# Patient Record
Sex: Male | Born: 1975 | Race: Black or African American | Hispanic: No | Marital: Single | Smoking: Never smoker
Health system: Southern US, Community
[De-identification: ages and names within clinical notes are randomized; demographics above are authoritative.]

## PROBLEM LIST (undated history)

## (undated) DIAGNOSIS — E785 Hyperlipidemia, unspecified: Secondary | ICD-10-CM

## (undated) DIAGNOSIS — I10 Essential (primary) hypertension: Secondary | ICD-10-CM

## (undated) DIAGNOSIS — H43399 Other vitreous opacities, unspecified eye: Secondary | ICD-10-CM

## (undated) DIAGNOSIS — R002 Palpitations: Secondary | ICD-10-CM

## (undated) DIAGNOSIS — Z21 Asymptomatic human immunodeficiency virus [HIV] infection status: Secondary | ICD-10-CM

## (undated) DIAGNOSIS — Z720 Tobacco use: Secondary | ICD-10-CM

## (undated) DIAGNOSIS — B20 Human immunodeficiency virus [HIV] disease: Secondary | ICD-10-CM

## (undated) HISTORY — DX: Palpitations: R00.2

## (undated) HISTORY — DX: Tobacco use: Z72.0

## (undated) HISTORY — PX: NO PAST SURGERIES: SHX2092

## (undated) HISTORY — DX: Other subjective visual disturbances: H43.399

---

## 2006-02-19 ENCOUNTER — Encounter (INDEPENDENT_AMBULATORY_CARE_PROVIDER_SITE_OTHER): Payer: Self-pay | Admitting: *Deleted

## 2006-02-19 ENCOUNTER — Ambulatory Visit: Payer: Self-pay | Admitting: Internal Medicine

## 2006-02-19 ENCOUNTER — Encounter: Admission: RE | Admit: 2006-02-19 | Discharge: 2006-02-19 | Payer: Self-pay | Admitting: Internal Medicine

## 2006-02-19 LAB — CONVERTED CEMR LAB: CD4 T Cell Abs: 510

## 2006-03-10 ENCOUNTER — Ambulatory Visit: Payer: Self-pay | Admitting: Internal Medicine

## 2006-03-20 ENCOUNTER — Ambulatory Visit: Payer: Self-pay | Admitting: Internal Medicine

## 2006-03-27 DIAGNOSIS — B20 Human immunodeficiency virus [HIV] disease: Secondary | ICD-10-CM

## 2006-03-27 DIAGNOSIS — R51 Headache: Secondary | ICD-10-CM | POA: Insufficient documentation

## 2006-03-27 DIAGNOSIS — R519 Headache, unspecified: Secondary | ICD-10-CM | POA: Insufficient documentation

## 2006-04-27 ENCOUNTER — Encounter (INDEPENDENT_AMBULATORY_CARE_PROVIDER_SITE_OTHER): Payer: Self-pay | Admitting: *Deleted

## 2006-04-27 LAB — CONVERTED CEMR LAB
CD4 Count: 0 microliters
HIV 1 RNA Quant: 0 copies/mL

## 2006-04-29 ENCOUNTER — Encounter: Payer: Self-pay | Admitting: Internal Medicine

## 2006-05-10 ENCOUNTER — Encounter (INDEPENDENT_AMBULATORY_CARE_PROVIDER_SITE_OTHER): Payer: Self-pay | Admitting: *Deleted

## 2006-07-03 ENCOUNTER — Telehealth: Payer: Self-pay | Admitting: Internal Medicine

## 2006-07-09 ENCOUNTER — Encounter: Admission: RE | Admit: 2006-07-09 | Discharge: 2006-07-09 | Payer: Self-pay | Admitting: Internal Medicine

## 2006-07-09 ENCOUNTER — Ambulatory Visit: Payer: Self-pay | Admitting: Internal Medicine

## 2006-07-09 LAB — CONVERTED CEMR LAB
ALT: 9 units/L (ref 0–53)
Alkaline Phosphatase: 55 units/L (ref 39–117)
Basophils Absolute: 0 10*3/uL (ref 0.0–0.1)
CD4 Count: 650 microliters
CO2: 27 meq/L (ref 19–32)
Eosinophils Relative: 3 % (ref 0–5)
HCT: 42.8 % (ref 39.0–52.0)
Lymphocytes Relative: 46 % (ref 12–46)
Platelets: 232 10*3/uL (ref 150–400)
RDW: 14.3 % — ABNORMAL HIGH (ref 11.5–14.0)
Sodium: 138 meq/L (ref 135–145)
Total Bilirubin: 0.5 mg/dL (ref 0.3–1.2)
Total Protein: 7.8 g/dL (ref 6.0–8.3)

## 2006-07-22 ENCOUNTER — Telehealth: Payer: Self-pay | Admitting: Internal Medicine

## 2006-07-29 ENCOUNTER — Ambulatory Visit: Payer: Self-pay | Admitting: Internal Medicine

## 2006-08-27 ENCOUNTER — Telehealth: Payer: Self-pay | Admitting: Internal Medicine

## 2006-10-05 ENCOUNTER — Telehealth: Payer: Self-pay

## 2006-10-19 ENCOUNTER — Emergency Department (HOSPITAL_COMMUNITY): Admission: EM | Admit: 2006-10-19 | Discharge: 2006-10-20 | Payer: Self-pay | Admitting: Emergency Medicine

## 2006-10-22 ENCOUNTER — Encounter: Admission: RE | Admit: 2006-10-22 | Discharge: 2006-10-22 | Payer: Self-pay | Admitting: Internal Medicine

## 2006-10-22 ENCOUNTER — Ambulatory Visit: Payer: Self-pay | Admitting: Internal Medicine

## 2006-10-22 LAB — CONVERTED CEMR LAB
AST: 16 units/L (ref 0–37)
Alkaline Phosphatase: 50 units/L (ref 39–117)
BUN: 11 mg/dL (ref 6–23)
Basophils Relative: 0 % (ref 0–1)
Calcium: 9.3 mg/dL (ref 8.4–10.5)
Creatinine, Ser: 1.02 mg/dL (ref 0.40–1.50)
Eosinophils Absolute: 0.1 10*3/uL (ref 0.0–0.7)
Eosinophils Relative: 2 % (ref 0–5)
HCT: 45.3 % (ref 39.0–52.0)
HIV-1 RNA Quant, Log: 3.81 — ABNORMAL HIGH (ref <1.70)
Hemoglobin: 14.9 g/dL (ref 13.0–17.0)
MCHC: 32.9 g/dL (ref 30.0–36.0)
MCV: 80.9 fL (ref 78.0–100.0)
Monocytes Absolute: 0.6 10*3/uL (ref 0.2–0.7)
Monocytes Relative: 10 % (ref 3–11)
RBC: 5.6 M/uL (ref 4.22–5.81)

## 2006-11-10 ENCOUNTER — Ambulatory Visit: Payer: Self-pay | Admitting: Internal Medicine

## 2006-11-10 DIAGNOSIS — R1013 Epigastric pain: Secondary | ICD-10-CM | POA: Insufficient documentation

## 2007-01-22 ENCOUNTER — Ambulatory Visit: Payer: Self-pay | Admitting: Internal Medicine

## 2007-02-09 ENCOUNTER — Encounter: Admission: RE | Admit: 2007-02-09 | Discharge: 2007-02-09 | Payer: Self-pay | Admitting: Internal Medicine

## 2007-02-09 ENCOUNTER — Ambulatory Visit: Payer: Self-pay | Admitting: Internal Medicine

## 2007-02-09 LAB — CONVERTED CEMR LAB
Albumin: 4.1 g/dL (ref 3.5–5.2)
Alkaline Phosphatase: 52 units/L (ref 39–117)
BUN: 14 mg/dL (ref 6–23)
CO2: 26 meq/L (ref 19–32)
Calcium: 9.4 mg/dL (ref 8.4–10.5)
Eosinophils Absolute: 0.1 10*3/uL — ABNORMAL LOW (ref 0.2–0.7)
Glucose, Bld: 75 mg/dL (ref 70–99)
HCT: 43 % (ref 39.0–52.0)
HIV 1 RNA Quant: 15500 copies/mL — ABNORMAL HIGH (ref <50)
HIV-1 RNA Quant, Log: 4.19 — ABNORMAL HIGH (ref <1.70)
Lymphocytes Relative: 37 % (ref 12–46)
Lymphs Abs: 1.9 10*3/uL (ref 0.7–4.0)
MCV: 83.2 fL (ref 78.0–100.0)
Monocytes Relative: 12 % (ref 3–12)
Neutrophils Relative %: 49 % (ref 43–77)
Potassium: 4.4 meq/L (ref 3.5–5.3)
RBC: 5.17 M/uL (ref 4.22–5.81)
Total Protein: 7.6 g/dL (ref 6.0–8.3)
WBC: 5.1 10*3/uL (ref 4.0–10.5)

## 2007-03-02 ENCOUNTER — Ambulatory Visit: Payer: Self-pay | Admitting: Internal Medicine

## 2007-03-02 ENCOUNTER — Encounter (INDEPENDENT_AMBULATORY_CARE_PROVIDER_SITE_OTHER): Payer: Self-pay | Admitting: *Deleted

## 2007-03-02 DIAGNOSIS — F411 Generalized anxiety disorder: Secondary | ICD-10-CM | POA: Insufficient documentation

## 2007-03-30 ENCOUNTER — Telehealth: Payer: Self-pay | Admitting: Internal Medicine

## 2007-06-07 ENCOUNTER — Ambulatory Visit: Payer: Self-pay | Admitting: Internal Medicine

## 2007-06-07 ENCOUNTER — Encounter: Admission: RE | Admit: 2007-06-07 | Discharge: 2007-06-07 | Payer: Self-pay | Admitting: Internal Medicine

## 2007-06-07 LAB — CONVERTED CEMR LAB
ALT: 8 units/L (ref 0–53)
Albumin: 4 g/dL (ref 3.5–5.2)
Basophils Absolute: 0 10*3/uL (ref 0.0–0.1)
CO2: 29 meq/L (ref 19–32)
Chloride: 103 meq/L (ref 96–112)
Glucose, Bld: 96 mg/dL (ref 70–99)
HCT: 42.5 % (ref 39.0–52.0)
HIV 1 RNA Quant: 13200 copies/mL — ABNORMAL HIGH (ref <50)
HIV-1 RNA Quant, Log: 4.12 — ABNORMAL HIGH (ref <1.70)
Lymphocytes Relative: 40 % (ref 12–46)
Lymphs Abs: 2.1 10*3/uL (ref 0.7–4.0)
Neutro Abs: 2.5 10*3/uL (ref 1.7–7.7)
Neutrophils Relative %: 47 % (ref 43–77)
Platelets: 229 10*3/uL (ref 150–400)
Potassium: 4.1 meq/L (ref 3.5–5.3)
RDW: 14.2 % (ref 11.5–15.5)
Sodium: 142 meq/L (ref 135–145)
Total Bilirubin: 0.5 mg/dL (ref 0.3–1.2)
Total Protein: 7.6 g/dL (ref 6.0–8.3)
WBC: 5.3 10*3/uL (ref 4.0–10.5)

## 2007-07-14 ENCOUNTER — Ambulatory Visit: Payer: Self-pay | Admitting: Internal Medicine

## 2007-09-24 ENCOUNTER — Ambulatory Visit: Payer: Self-pay | Admitting: Internal Medicine

## 2007-09-24 ENCOUNTER — Ambulatory Visit (HOSPITAL_COMMUNITY): Admission: RE | Admit: 2007-09-24 | Discharge: 2007-09-24 | Payer: Self-pay | Admitting: Internal Medicine

## 2007-09-24 DIAGNOSIS — M549 Dorsalgia, unspecified: Secondary | ICD-10-CM | POA: Insufficient documentation

## 2008-03-28 ENCOUNTER — Ambulatory Visit: Payer: Self-pay | Admitting: Internal Medicine

## 2008-03-28 LAB — CONVERTED CEMR LAB
ALT: 13 units/L (ref 0–53)
AST: 18 units/L (ref 0–37)
Basophils Absolute: 0 10*3/uL (ref 0.0–0.1)
Basophils Relative: 0 % (ref 0–1)
Creatinine, Ser: 0.85 mg/dL (ref 0.40–1.50)
Eosinophils Relative: 1 % (ref 0–5)
Hemoglobin: 14 g/dL (ref 13.0–17.0)
Lymphocytes Relative: 45 % (ref 12–46)
MCHC: 32.6 g/dL (ref 30.0–36.0)
Monocytes Absolute: 0.5 10*3/uL (ref 0.1–1.0)
Neutro Abs: 2.5 10*3/uL (ref 1.7–7.7)
Platelets: 202 10*3/uL (ref 150–400)
RDW: 14.4 % (ref 11.5–15.5)
Sodium: 140 meq/L (ref 135–145)
Total Bilirubin: 0.5 mg/dL (ref 0.3–1.2)
Total Protein: 7.6 g/dL (ref 6.0–8.3)

## 2008-03-29 ENCOUNTER — Encounter: Payer: Self-pay | Admitting: Internal Medicine

## 2008-04-14 ENCOUNTER — Encounter (INDEPENDENT_AMBULATORY_CARE_PROVIDER_SITE_OTHER): Payer: Self-pay | Admitting: *Deleted

## 2008-04-14 ENCOUNTER — Ambulatory Visit: Payer: Self-pay | Admitting: Internal Medicine

## 2008-08-30 ENCOUNTER — Ambulatory Visit: Payer: Self-pay | Admitting: Internal Medicine

## 2008-08-30 LAB — CONVERTED CEMR LAB
Albumin: 4 g/dL (ref 3.5–5.2)
BUN: 11 mg/dL (ref 6–23)
Basophils Absolute: 0 10*3/uL (ref 0.0–0.1)
Calcium: 9 mg/dL (ref 8.4–10.5)
Chloride: 106 meq/L (ref 96–112)
Creatinine, Ser: 0.91 mg/dL (ref 0.40–1.50)
Glucose, Bld: 86 mg/dL (ref 70–99)
HIV 1 RNA Quant: 18700 copies/mL — ABNORMAL HIGH (ref <48)
Hemoglobin: 14 g/dL (ref 13.0–17.0)
Lymphocytes Relative: 47 % — ABNORMAL HIGH (ref 12–46)
Lymphs Abs: 2 10*3/uL (ref 0.7–4.0)
Monocytes Absolute: 0.5 10*3/uL (ref 0.1–1.0)
Monocytes Relative: 11 % (ref 3–12)
Neutro Abs: 1.6 10*3/uL — ABNORMAL LOW (ref 1.7–7.7)
Potassium: 4.3 meq/L (ref 3.5–5.3)
RBC: 5.16 M/uL (ref 4.22–5.81)
RDW: 14.3 % (ref 11.5–15.5)
WBC: 4.2 10*3/uL (ref 4.0–10.5)

## 2008-09-19 ENCOUNTER — Encounter (INDEPENDENT_AMBULATORY_CARE_PROVIDER_SITE_OTHER): Payer: Self-pay | Admitting: *Deleted

## 2008-09-20 ENCOUNTER — Ambulatory Visit: Payer: Self-pay | Admitting: Internal Medicine

## 2008-10-31 ENCOUNTER — Emergency Department (HOSPITAL_COMMUNITY): Admission: EM | Admit: 2008-10-31 | Discharge: 2008-10-31 | Payer: Self-pay | Admitting: Emergency Medicine

## 2008-10-31 ENCOUNTER — Ambulatory Visit: Payer: Self-pay | Admitting: Internal Medicine

## 2008-10-31 DIAGNOSIS — M542 Cervicalgia: Secondary | ICD-10-CM | POA: Insufficient documentation

## 2008-11-03 ENCOUNTER — Ambulatory Visit (HOSPITAL_COMMUNITY): Admission: RE | Admit: 2008-11-03 | Discharge: 2008-11-03 | Payer: Self-pay | Admitting: Internal Medicine

## 2009-01-02 ENCOUNTER — Ambulatory Visit: Payer: Self-pay | Admitting: Internal Medicine

## 2009-01-02 LAB — CONVERTED CEMR LAB
Alkaline Phosphatase: 42 units/L (ref 39–117)
Basophils Relative: 0 % (ref 0–1)
Eosinophils Absolute: 0.1 10*3/uL (ref 0.0–0.7)
Glucose, Bld: 85 mg/dL (ref 70–99)
Hemoglobin: 14 g/dL (ref 13.0–17.0)
MCHC: 32.5 g/dL (ref 30.0–36.0)
MCV: 82.7 fL (ref >=78.0)
Monocytes Absolute: 0.4 10*3/uL (ref 0.1–1.0)
Monocytes Relative: 6 % (ref 3–12)
Neutrophils Relative %: 59 % (ref 43–77)
RBC: 5.21 M/uL (ref 4.22–5.81)
Sodium: 139 meq/L (ref 135–145)
Total Bilirubin: 0.4 mg/dL (ref 0.3–1.2)
Total Protein: 7.3 g/dL (ref 6.0–8.3)

## 2009-01-17 ENCOUNTER — Ambulatory Visit: Payer: Self-pay | Admitting: Internal Medicine

## 2009-02-05 ENCOUNTER — Telehealth (INDEPENDENT_AMBULATORY_CARE_PROVIDER_SITE_OTHER): Payer: Self-pay | Admitting: *Deleted

## 2009-04-11 ENCOUNTER — Emergency Department (HOSPITAL_BASED_OUTPATIENT_CLINIC_OR_DEPARTMENT_OTHER): Admission: EM | Admit: 2009-04-11 | Discharge: 2009-04-11 | Payer: Self-pay | Admitting: Emergency Medicine

## 2009-06-13 ENCOUNTER — Ambulatory Visit: Payer: Self-pay | Admitting: Internal Medicine

## 2009-06-13 LAB — CONVERTED CEMR LAB
Basophils Relative: 1 % (ref 0–1)
CO2: 28 meq/L (ref 19–32)
Creatinine, Ser: 1.01 mg/dL (ref 0.40–1.50)
Eosinophils Absolute: 0.1 10*3/uL (ref 0.0–0.7)
Glucose, Bld: 72 mg/dL (ref 70–99)
HIV 1 RNA Quant: 41400 copies/mL — ABNORMAL HIGH (ref <48)
HIV-1 RNA Quant, Log: 4.62 — ABNORMAL HIGH (ref <1.68)
Hemoglobin: 13.9 g/dL (ref 13.0–17.0)
MCHC: 32.6 g/dL (ref 30.0–36.0)
MCV: 83.7 fL (ref 78.0–100.0)
Monocytes Absolute: 0.3 10*3/uL (ref 0.1–1.0)
Monocytes Relative: 9 % (ref 3–12)
RBC: 5.09 M/uL (ref 4.22–5.81)
Total Bilirubin: 0.5 mg/dL (ref 0.3–1.2)
Total Protein: 7.6 g/dL (ref 6.0–8.3)

## 2009-06-25 ENCOUNTER — Ambulatory Visit: Payer: Self-pay | Admitting: Internal Medicine

## 2009-06-25 DIAGNOSIS — R630 Anorexia: Secondary | ICD-10-CM | POA: Insufficient documentation

## 2009-07-03 ENCOUNTER — Encounter (INDEPENDENT_AMBULATORY_CARE_PROVIDER_SITE_OTHER): Payer: Self-pay | Admitting: *Deleted

## 2009-11-19 ENCOUNTER — Telehealth (INDEPENDENT_AMBULATORY_CARE_PROVIDER_SITE_OTHER): Payer: Self-pay | Admitting: *Deleted

## 2009-12-06 ENCOUNTER — Ambulatory Visit: Payer: Self-pay | Admitting: Internal Medicine

## 2009-12-06 LAB — CONVERTED CEMR LAB
ALT: 10 units/L (ref 0–53)
Albumin: 4.3 g/dL (ref 3.5–5.2)
Basophils Absolute: 0 10*3/uL (ref 0.0–0.1)
CO2: 27 meq/L (ref 19–32)
Calcium: 8.9 mg/dL (ref 8.4–10.5)
Chloride: 104 meq/L (ref 96–112)
Creatinine, Ser: 0.97 mg/dL (ref 0.40–1.50)
HIV 1 RNA Quant: 29600 copies/mL — ABNORMAL HIGH (ref <20)
Hemoglobin: 13.8 g/dL (ref 13.0–17.0)
Lymphocytes Relative: 44 % (ref 12–46)
Monocytes Absolute: 0.4 10*3/uL (ref 0.1–1.0)
Neutro Abs: 1.8 10*3/uL (ref 1.7–7.7)
Neutrophils Relative %: 45 % (ref 43–77)
Potassium: 3.9 meq/L (ref 3.5–5.3)
RDW: 14.1 % (ref 11.5–15.5)
Sodium: 137 meq/L (ref 135–145)
Total Protein: 7.1 g/dL (ref 6.0–8.3)

## 2009-12-12 ENCOUNTER — Encounter (INDEPENDENT_AMBULATORY_CARE_PROVIDER_SITE_OTHER): Payer: Self-pay | Admitting: *Deleted

## 2009-12-28 ENCOUNTER — Ambulatory Visit: Payer: Self-pay | Admitting: Internal Medicine

## 2010-01-04 ENCOUNTER — Telehealth (INDEPENDENT_AMBULATORY_CARE_PROVIDER_SITE_OTHER): Payer: Self-pay | Admitting: *Deleted

## 2010-02-18 ENCOUNTER — Telehealth (INDEPENDENT_AMBULATORY_CARE_PROVIDER_SITE_OTHER): Payer: Self-pay | Admitting: *Deleted

## 2010-02-28 ENCOUNTER — Ambulatory Visit
Admission: RE | Admit: 2010-02-28 | Discharge: 2010-02-28 | Payer: Self-pay | Source: Home / Self Care | Attending: Internal Medicine | Admitting: Internal Medicine

## 2010-02-28 ENCOUNTER — Encounter: Payer: Self-pay | Admitting: Internal Medicine

## 2010-02-28 LAB — CONVERTED CEMR LAB
AST: 15 units/L (ref 0–37)
Alkaline Phosphatase: 32 units/L — ABNORMAL LOW (ref 39–117)
BUN: 11 mg/dL (ref 6–23)
Basophils Absolute: 0 10*3/uL (ref 0.0–0.1)
Basophils Relative: 0 % (ref 0–1)
Creatinine, Ser: 0.82 mg/dL (ref 0.40–1.50)
Eosinophils Relative: 0 % (ref 0–5)
Glucose, Bld: 102 mg/dL — ABNORMAL HIGH (ref 70–99)
HCT: 39.3 % (ref 39.0–52.0)
HIV-1 RNA Quant, Log: 4.83 — ABNORMAL HIGH (ref <1.30)
Hemoglobin: 13 g/dL (ref 13.0–17.0)
MCHC: 33.1 g/dL (ref 30.0–36.0)
Monocytes Absolute: 0.7 10*3/uL (ref 0.1–1.0)
Monocytes Relative: 10 % (ref 3–12)
RBC: 4.78 M/uL (ref 4.22–5.81)
RDW: 14.8 % (ref 11.5–15.5)
Total Bilirubin: 0.3 mg/dL (ref 0.3–1.2)

## 2010-03-13 ENCOUNTER — Ambulatory Visit: Admit: 2010-03-13 | Payer: Self-pay | Admitting: Internal Medicine

## 2010-03-24 ENCOUNTER — Encounter: Payer: Self-pay | Admitting: Internal Medicine

## 2010-04-01 ENCOUNTER — Ambulatory Visit
Admission: RE | Admit: 2010-04-01 | Discharge: 2010-04-01 | Payer: Self-pay | Source: Home / Self Care | Attending: Internal Medicine | Admitting: Internal Medicine

## 2010-04-01 DIAGNOSIS — F17200 Nicotine dependence, unspecified, uncomplicated: Secondary | ICD-10-CM | POA: Insufficient documentation

## 2010-04-02 NOTE — Assessment & Plan Note (Signed)
Summary: f/u appt./ dde   CC:  follow-up visit, lab results, and c/o hot flashes.  History of Present Illness: Pt here for lab esults. He c/o occasional night sweats.No fever. He gained 2 lbs. He never got his megace to help him gain weight.  Preventive Screening-Counseling & Management  Alcohol-Tobacco     Alcohol drinks/day: 0     Smoking Status: current     Packs/Day: <0.25     Year Quit: 2010     Passive Smoke Exposure: yes  Caffeine-Diet-Exercise     Caffeine use/day: coffee     Does Patient Exercise: yes     Type of exercise: walking     Exercise (avg: min/session): >60     Times/week: 7  Safety-Violence-Falls     Seat Belt Use: yes      Sexual History:  none.        Drug Use:  former and marijuana.        Blood Transfusions:  no.        Travel History:  no.    Comments: pt. given condoms   Updated Prior Medication List: MEGACE ES 625 MG/5ML SUSP (MEGESTROL ACETATE) 4ml by mouth once daily  Current Allergies (reviewed today): ! PENICILLIN Past History:  Past Medical History: Last updated: 03/27/2006 HIV disease Headache  Review of Systems  The patient denies anorexia, fever, weight loss, and dyspnea on exertion.    Vital Signs:  Patient profile:   35 year old male Height:      62 inches (157.48 cm) Weight:      124.8 pounds (56.73 kg) BMI:     22.91 Temp:     98.7 degrees F (37.06 degrees C) oral Pulse rate:   89 / minute BP sitting:   138 / 89  (right arm)  Vitals Entered By: Wendall Mola CMA Duncan Dull) (December 28, 2009 2:23 PM) CC: follow-up visit, lab results, c/o hot flashes Is Patient Diabetic? No Pain Assessment Patient in pain? no      Nutritional Status BMI of 19 -24 = normal Nutritional Status Detail appetite "not good"  Have you ever been in a relationship where you felt threatened, hurt or afraid?No   Does patient need assistance? Functional Status Self care Ambulation Normal   Physical Exam  General:  alert,  well-developed, well-nourished, and well-hydrated.   Head:  normocephalic and atraumatic.   Mouth:  pharynx pink and moist.   Neck:  no cervical lymphadenopathy.   Lungs:  normal breath sounds.     Impression & Recommendations:  Problem # 1:  HIV DISEASE (ICD-042) Pt currently asymptomatic and not on therapy because he does not wish to be.  He will return in 3 months for repeat labs.  Influena vaccine given. Diagnostics Reviewed:  HIV: HIV positive - not AIDS (04/14/2008)   CD4: 460 (12/10/2009)   WBC: 3.9 (12/06/2009)   Hgb: 13.8 (12/06/2009)   HCT: 40.4 (12/06/2009)   Platelets: 207 (12/06/2009) HIV genotype: See Comment (08/30/2008)   HIV-1 RNA: 29600 (12/06/2009)   HBSAg: No (04/27/2006)  Problem # 2:  LOSS OF APPETITE (ICD-783.0) paperwork completed for [facility] to get megace  Other Orders: Est. Patient Level III (36644) Influenza Vaccine NON MCR (03474) Future Orders: T-CD4SP ([facility]) (CD4SP) ... 03/28/2010 T-HIV Viral Load [phone]) ... 03/28/2010 T-Comprehensive Metabolic Panel [phone]) ... 03/28/2010 T-CBC w/Diff ([id number]) ... 03/28/2010 T-RPR (Syphilis) [phone]) ... 03/28/2010  Patient Instructions: 1)  Please schedule a follow-up appointment in 3 months, 2 weeks after labs.  Immunizations Administered:  Influenza Vaccine # 1:    Vaccine Type: Fluvax Non-MCR    Site: right deltoid    Mfr: Novartis    Dose: 0.5 ml    Route: IM    Given by: Wendall Mola CMA ( AAMA)    Exp. Date: 06/02/2010    Lot #: [id number]    VIS given: 09/25/09 version given December 28, 2009.  Flu Vaccine Consent Questions:    Do you have a history of severe allergic reactions to this vaccine? no    Any prior history of allergic reactions to egg and/or gelatin? no    Do you have a sensitivity to the preservative Thimersol? no    Do you have a past history of Guillan-Barre Syndrome? no    Do you currently have an acute febrile illness? no    Have you ever had a  severe reaction to latex? no    Vaccine information given and explained to patient? yes

## 2010-04-02 NOTE — Progress Notes (Signed)
Summary: PPD  Phone Note Outgoing Call   Call placed by: Annice Pih Summary of Call: Pt. needs PPD at next office visit Initial call taken by: Wendall Mola CMA Duncan Dull),  November 19, 2009 12:45 PM

## 2010-04-02 NOTE — Progress Notes (Signed)
Summary: Megace PAP arrived, pt. notified  Phone Note Outgoing Call   Call placed by: Jennet Maduro RN,  January 04, 2010 12:36 PM Call placed to: Patient Action Taken: Assistance medications ready for pick up Summary of Call: Megace PAP arrived Lot # [id number] Exp. June 2014 # 150 mL Pt notified, message left. Jennet Maduro RN  January 04, 2010 12:38 PM

## 2010-04-02 NOTE — Assessment & Plan Note (Signed)
Summary: F/U [MKJ]   CC:  follow-up visit, lab results, and pt. complaining of hot flashes.  History of Present Illness: Pt here for f/u. He has been feeling well except for hot flashes. He would also like to gain some weight.  His appetite has not been good.  No abdominal pain, nausea or vomiting.  Preventive Screening-Counseling & Management  Alcohol-Tobacco     Alcohol drinks/day: 0     Smoking Status: current     Packs/Day: <0.25     Year Quit: 2010     Passive Smoke Exposure: yes  Caffeine-Diet-Exercise     Caffeine use/day: coffee     Does Patient Exercise: yes     Type of exercise: walking     Exercise (avg: min/session): >60     Times/week: 7  Safety-Violence-Falls     Seat Belt Use: yes      Sexual History:  none.        Drug Use:  former and marijuana.        Blood Transfusions:  no.        Travel History:  no.     Updated Prior Medication List: MEGACE ES 625 MG/5ML SUSP (MEGESTROL ACETATE) 4ml by mouth once daily  Current Allergies (reviewed today): ! PENICILLIN Past History:  Past Medical History: Last updated: 03/27/2006 HIV disease Headache  Review of Systems       The patient complains of weight loss.  The patient denies fever, abdominal pain, and severe indigestion/heartburn.    Vital Signs:  Patient profile:   35 year old male Height:      62 inches (157.48 cm) Weight:      122.3 pounds (55.59 kg) BMI:     22.45 Temp:     98.3 degrees F (36.83 degrees C) oral Pulse rate:   80 / minute BP sitting:   114 / 69  (right arm)  Vitals Entered By: Wendall Mola CMA Duncan Dull) (June 25, 2009 3:11 PM) CC: follow-up visit, lab results, pt. complaining of hot flashes Is Patient Diabetic? No Pain Assessment Patient in pain? no      Nutritional Status BMI of 19 -24 = normal Nutritional Status Detail appetite "good"  Have you ever been in a relationship where you felt threatened, hurt or afraid?No   Does patient need  assistance? Functional Status Self care Ambulation Normal   Physical Exam  General:  alert, well-developed, well-nourished, and well-hydrated.   Head:  normocephalic and atraumatic.   Lungs:  normal breath sounds.      Impression & Recommendations:  Problem # 1:  HIV DISEASE (ICD-042) Pt currently not on therapy and does not wish to be. CD4ct stable. Diagnostics Reviewed:  HIV: HIV positive - not AIDS (04/14/2008)   CD4: 450 (06/14/2009)   WBC: 3.8 (06/13/2009)   Hgb: 13.9 (06/13/2009)   HCT: 42.6 (06/13/2009)   Platelets: 198 (06/13/2009) HIV genotype: See Comment (08/30/2008)   HIV-1 RNA: 41400 (06/13/2009)   HBSAg: No (04/27/2006)  Problem # 2:  LOSS OF APPETITE (ICD-783.0) will try patient on megace.  Medications Added to Medication List This Visit: 1)  Megace Es 625 Mg/69ml Susp (Megestrol acetate) .... 4ml by mouth once daily  Other Orders: Est. Patient Level III (16109) Future Orders: T-CD4SP ([facility]) (CD4SP) ... 09/23/2009 T-HIV Viral Load [phone]) ... 09/23/2009 T-Comprehensive Metabolic Panel [phone]) ... 09/23/2009 T-CBC w/Diff ([id number]) ... 09/23/2009  Patient Instructions: 1)  Please schedule a follow-up appointment in 3 months, 2 weeks after labs.

## 2010-04-02 NOTE — Miscellaneous (Signed)
Summary: clinical update/ryan white   Clinical Lists Changes  Observations: Added new observation of AIDSDAP: Pending (07/03/2009 10:41) Added new observation of FINASSESSDT: 07/03/2009 (07/03/2009 10:41)

## 2010-04-02 NOTE — Miscellaneous (Signed)
Summary: [employer] PAP application for Megace faxed  Clinical Lists Changes found application for megace dated 07/03/09. not clear if it was sent in.  I called [employer]. I spoke with Al. he was sent 1 bottle. for the other refills (has 2 more) md & pt have to resign & redate the form. Al is faxing it here. when I get it I will call the pt to come sign & ask if he has any other proof of income. there is a form for the IRS to get a copy of last years return as proof of income. there is a fee to get this copy from them.  since [employer] sent him the meds, they must have rec'd some form of proof of income.Golden Circle RN  December 12, 2009 5:23 PM  PAP application for Megace completed and placed in MD box for signature.  Pt. has appt. 12/28/09 @ 2:00pm.  Pt. also needs to sign the form.  RN talked with pt. and he stated that he continues not to have an appetite. Jennet Maduro RN  December 25, 2009 2:38 PM   Pt. signed PAP form. Jennet Maduro RN  December 28, 2009 3:06 PM

## 2010-04-04 NOTE — Progress Notes (Signed)
Summary: Megace PAP refill requested from [employer] Note Call from Patient Call back at [facility] Phone [phone]   Caller: Patient Reason for Call: Refill Medication Summary of Call: Message left requesting refill of Megace.  Phone call to PAP [name]).  Message left ordering refill of Megace to be shipped to Center. Jennet Maduro RN  February 18, 2010 6:43 PM

## 2010-04-10 NOTE — Assessment & Plan Note (Signed)
Summary: F/U OV/VS   Vital Signs:  Patient profile:   35 year old male Height:      62 inches (157.48 cm) Weight:      137.8 pounds (62.64 kg) BMI:     25.30 Temp:     99.2 degrees F (37.33 degrees C) oral Pulse rate:   87 / minute BP sitting:   111 / 70  (right arm)  Vitals Entered By: Wendall Mola CMA Duncan Dull) (April 01, 2010 9:47 AM)  Physical Exam  General:  well-developed and well-nourished.   Eyes:  pupils equal, pupils round, and pupils reactive to light.   Mouth:  pharynx pink and moist and no exudates.   Neck:  no masses.   Lungs:  normal respiratory effort and normal breath sounds.   Heart:  normal rate, regular rhythm, and no murmur.   Abdomen:  soft, non-tender, and normal bowel sounds.     Impression & Recommendations:  Problem # 1:  HIV DISEASE (ICD-042)  enoucraged to enroll in start study. wants to defer. not sexually active. at next visit needs PNVX. will check his labs (and rpr, lipids) at f/u visit. will try to get him ensure from [facility]. return to clinic 5-6 months.   Future Orders: T-Hepatitis A Antibody ([id number]) ... 09/28/2010  Problem # 2:  TOBACCO USER (ICD-305.1) encouraged to quit.   Other Orders: Est. Patient Level IV (82956) Future Orders: T-CD4SP ([facility]) (CD4SP) ... 09/28/2010 T-HIV Viral Load [id number]) ... 09/28/2010 T-Comprehensive Metabolic Panel [id number]) ... 09/28/2010 T-CBC w/Diff ([id number]) ... 09/28/2010 T-Lipid Profile [phone]) ... 09/28/2010 T-RPR (Syphilis) [phone]) ... 09/28/2010    CC:  follow-up visit, lab results, and requesting Megace.  History of Present Illness: 35 yo M with hx of anxiety, HIV+ (dx 8-07), not on Rx. Last CD4 770 and VL 67,700 (02-28-10). Had Naive genotype 08-30-08. Hazs been doing well. Without complaints.    Family History: Breast CA (mother and sister decesed).   Social History: Single Current Smoker- 3cigarrettes/day Alcohol use-no Drug use-no Drug Use:   no   Review of Systems       moving bowels well, passing urine well, 13# up- taking megace (off for 2 wekks).   CC: follow-up visit, lab results, requesting Megace Is Patient Diabetic? No Pain Assessment Patient in pain? no      Nutritional Status BMI of 25 - 29 = overweight Nutritional Status Detail appetite "good"  Have you ever been in a relationship where you felt threatened, hurt or afraid?No   Does patient need assistance? Functional Status Self care Ambulation Normal   Preventive Screening-Counseling & Management  Alcohol-Tobacco     Alcohol drinks/day: 0     Smoking Status: current     Packs/Day: <0.25     Year Quit: 2010     Passive Smoke Exposure: yes  Caffeine-Diet-Exercise     Caffeine use/day: coffee     Does Patient Exercise: yes     Type of exercise: walking     Exercise (avg: min/session): >60     Times/week: 7  Safety-Violence-Falls     Seat Belt Use: yes      Sexual History:  none.        Drug Use:  no.        Blood Transfusions:  no.        Travel History:  no.    Comments: pt. given condoms   Medical History Passive cigarette smoke exposure: yes  Behavioral Health Assessment Do you drink alcohol?  no Frequency: 0 Do you use recreational drugs? no  Evaluation and Follow-Up  Prevention For Positives: 04/01/2010   Safe sex practices discussed with patient. Condoms offered. Prior Medications: MEGACE ES 625 MG/5ML SUSP (MEGESTROL ACETATE) 4ml by mouth once daily Current Allergies (reviewed today): ! PENICILLIN PPD Results    Date of reading: 01/19/2009    Results: < 5mm    Interpretation: negative  Orders Added: 1)  T-CD4SP ([facility]) [CD4SP] 2)  T-HIV Viral Load [phone] 3)  T-Comprehensive Metabolic Panel [[id number]] 4)  T-CBC w/Diff [[id number]] 5)  T-Lipid Profile [80061-22930] 6)  T-RPR (Syphilis) [29562-13086] 7)  Est. Patient Level IV [57846] 8)  T-Hepatitis A Antibody [[id number]]           Prevention  For Positives: 04/01/2010   Safe sex practices discussed with patient. Condoms offered.

## 2010-04-20 ENCOUNTER — Encounter (INDEPENDENT_AMBULATORY_CARE_PROVIDER_SITE_OTHER): Payer: Self-pay | Admitting: *Deleted

## 2010-04-24 NOTE — Miscellaneous (Signed)
  Clinical Lists Changes  Observations: Added new observation of AIDSDAP: No (04/20/2010 10:45)

## 2010-05-16 LAB — T-HELPER CELL (CD4) - (RCID CLINIC ONLY): CD4 T Cell Abs: 460 uL (ref 400–2700)

## 2010-05-22 LAB — T-HELPER CELL (CD4) - (RCID CLINIC ONLY)
CD4 % Helper T Cell: 27 % — ABNORMAL LOW (ref 33–55)
CD4 T Cell Abs: 450 uL (ref 400–2700)

## 2010-06-05 LAB — T-HELPER CELL (CD4) - (RCID CLINIC ONLY): CD4 T Cell Abs: 450 uL (ref 400–2700)

## 2010-06-10 LAB — T-HELPER CELL (CD4) - (RCID CLINIC ONLY): CD4 % Helper T Cell: 26 % — ABNORMAL LOW (ref 33–55)

## 2010-07-04 ENCOUNTER — Ambulatory Visit: Payer: Self-pay | Admitting: Adult Health

## 2010-08-27 ENCOUNTER — Other Ambulatory Visit: Payer: Self-pay

## 2010-09-06 ENCOUNTER — Other Ambulatory Visit: Payer: Self-pay

## 2010-09-06 DIAGNOSIS — Z113 Encounter for screening for infections with a predominantly sexual mode of transmission: Secondary | ICD-10-CM

## 2010-09-06 DIAGNOSIS — B20 Human immunodeficiency virus [HIV] disease: Secondary | ICD-10-CM

## 2010-09-06 DIAGNOSIS — Z79899 Other long term (current) drug therapy: Secondary | ICD-10-CM

## 2010-09-16 ENCOUNTER — Ambulatory Visit: Payer: Self-pay | Admitting: Infectious Diseases

## 2010-09-24 ENCOUNTER — Other Ambulatory Visit: Payer: Self-pay

## 2010-10-14 ENCOUNTER — Ambulatory Visit: Payer: Self-pay | Admitting: Infectious Diseases

## 2010-11-08 ENCOUNTER — Other Ambulatory Visit: Payer: Self-pay

## 2010-11-15 ENCOUNTER — Other Ambulatory Visit: Payer: Self-pay

## 2010-11-26 LAB — T-HELPER CELL (CD4) - (RCID CLINIC ONLY): CD4 % Helper T Cell: 25 — ABNORMAL LOW

## 2010-12-02 ENCOUNTER — Ambulatory Visit: Payer: Self-pay | Admitting: Infectious Diseases

## 2010-12-09 LAB — T-HELPER CELL (CD4) - (RCID CLINIC ONLY)
CD4 % Helper T Cell: 26 — ABNORMAL LOW
CD4 T Cell Abs: 480

## 2010-12-13 LAB — T-HELPER CELL (CD4) - (RCID CLINIC ONLY)
CD4 % Helper T Cell: 22 — ABNORMAL LOW
CD4 T Cell Abs: 490

## 2011-01-03 ENCOUNTER — Other Ambulatory Visit: Payer: Self-pay | Admitting: Infectious Diseases

## 2011-01-03 ENCOUNTER — Other Ambulatory Visit (INDEPENDENT_AMBULATORY_CARE_PROVIDER_SITE_OTHER): Payer: Self-pay

## 2011-01-03 DIAGNOSIS — Z79899 Other long term (current) drug therapy: Secondary | ICD-10-CM

## 2011-01-03 DIAGNOSIS — B20 Human immunodeficiency virus [HIV] disease: Secondary | ICD-10-CM

## 2011-01-03 DIAGNOSIS — Z113 Encounter for screening for infections with a predominantly sexual mode of transmission: Secondary | ICD-10-CM

## 2011-01-03 LAB — CBC WITH DIFFERENTIAL/PLATELET
Basophils Absolute: 0 10*3/uL (ref 0.0–0.1)
Basophils Relative: 0 % (ref 0–1)
Eosinophils Absolute: 0 10*3/uL (ref 0.0–0.7)
MCH: 27.7 pg (ref 26.0–34.0)
MCHC: 34.1 g/dL (ref 30.0–36.0)
Neutro Abs: 2.4 10*3/uL (ref 1.7–7.7)
Neutrophils Relative %: 50 % (ref 43–77)
Platelets: 204 10*3/uL (ref 150–400)
RDW: 13.6 % (ref 11.5–15.5)

## 2011-01-03 LAB — URINALYSIS, ROUTINE W REFLEX MICROSCOPIC
Bilirubin Urine: NEGATIVE
Ketones, ur: NEGATIVE mg/dL
Nitrite: NEGATIVE
Protein, ur: NEGATIVE mg/dL
Specific Gravity, Urine: 1.026 (ref 1.005–1.030)
Urobilinogen, UA: 1 mg/dL (ref 0.0–1.0)

## 2011-01-03 LAB — T-HELPER CELL (CD4) - (RCID CLINIC ONLY): CD4 % Helper T Cell: 26 % — ABNORMAL LOW (ref 33–55)

## 2011-01-03 LAB — URINALYSIS, MICROSCOPIC ONLY
Crystals: NONE SEEN
Squamous Epithelial / LPF: NONE SEEN

## 2011-01-04 LAB — COMPLETE METABOLIC PANEL WITH GFR
AST: 20 U/L (ref 0–37)
Albumin: 4.1 g/dL (ref 3.5–5.2)
Alkaline Phosphatase: 50 U/L (ref 39–117)
GFR, Est Non African American: >89 mL/min (ref >89)
Potassium: 3.9 mEq/L (ref 3.5–5.3)
Sodium: 136 mEq/L (ref 135–145)
Total Bilirubin: 0.7 mg/dL (ref 0.3–1.2)
Total Protein: 7.1 g/dL (ref 6.0–8.3)

## 2011-01-04 LAB — GC/CHLAMYDIA PROBE AMP, URINE
Chlamydia, Swab/Urine, PCR: NEGATIVE
GC Probe Amp, Urine: NEGATIVE

## 2011-01-04 LAB — RPR

## 2011-01-04 LAB — LIPID PANEL
LDL Cholesterol: 91 mg/dL (ref 0–99)
Triglycerides: 61 mg/dL (ref <150)
VLDL: 12 mg/dL (ref 0–40)

## 2011-01-07 LAB — HIV-1 RNA QUANT-NO REFLEX-BLD: HIV 1 RNA Quant: 28500 copies/mL — ABNORMAL HIGH (ref <20)

## 2011-02-04 ENCOUNTER — Ambulatory Visit (INDEPENDENT_AMBULATORY_CARE_PROVIDER_SITE_OTHER): Payer: Self-pay | Admitting: Infectious Diseases

## 2011-02-04 ENCOUNTER — Encounter: Payer: Self-pay | Admitting: Infectious Diseases

## 2011-02-04 ENCOUNTER — Other Ambulatory Visit: Payer: Self-pay | Admitting: *Deleted

## 2011-02-04 ENCOUNTER — Ambulatory Visit (HOSPITAL_COMMUNITY)
Admission: RE | Admit: 2011-02-04 | Discharge: 2011-02-04 | Disposition: A | Discharge status: Home / Self Care | Payer: Self-pay | Source: Ambulatory Visit | Attending: Infectious Diseases | Admitting: Infectious Diseases

## 2011-02-04 ENCOUNTER — Other Ambulatory Visit: Payer: Self-pay

## 2011-02-04 DIAGNOSIS — Z23 Encounter for immunization: Secondary | ICD-10-CM

## 2011-02-04 DIAGNOSIS — R630 Anorexia: Secondary | ICD-10-CM

## 2011-02-04 DIAGNOSIS — R079 Chest pain, unspecified: Secondary | ICD-10-CM | POA: Insufficient documentation

## 2011-02-04 DIAGNOSIS — B20 Human immunodeficiency virus [HIV] disease: Secondary | ICD-10-CM

## 2011-02-04 DIAGNOSIS — B351 Tinea unguium: Secondary | ICD-10-CM | POA: Insufficient documentation

## 2011-02-04 DIAGNOSIS — F411 Generalized anxiety disorder: Secondary | ICD-10-CM

## 2011-02-04 DIAGNOSIS — R55 Syncope and collapse: Secondary | ICD-10-CM

## 2011-02-04 MED ORDER — ENSURE PO LIQD: 1.0000 | Freq: Two times a day (BID) | ORAL | Status: DC

## 2011-02-04 MED ORDER — MEGESTROL ACETATE 625 MG/5ML PO SUSP: 625.0000 mg | Freq: Every day | ORAL | Status: DC

## 2011-02-04 NOTE — Progress Notes (Signed)
  Subjective:    Patient ID: Rodney Chase, male    DOB: March 15, 1975, 35 y.o.   MRN: [id number]  HPI 35 yo M with hx of anxiety, HIV+ (dx 8-07), not on Rx. Last CD4 770 --> 510 and VL 67,700---> 25,800 (02-28-10--> 01-05-11). Had Naive genotype 08-30-08. Was referred to the start study.  Has lost 15# since last visit. States he has felt depressed every day.  Has been having hot flashes.Sometimes when "overly dressed", more common when he is exerting himself. Has also been having "black outs"- 2-3 x/last 2 months. Soemetimes has with hot flashes. Last episode ~ 1 month ago. Has dizziness and lightheadedness. Feels like it is getting darker and darker all the way around him. When episode over he has no confusion, no incontinence. States typically he is out of breath and feels his heart racing.     ALso having trouble with his big toe- lots of pain.   Review of Systems     Objective:   Physical Exam  Constitutional: He appears well-developed and well-nourished.  HENT:  Mouth/Throat: Oropharynx is clear and moist. Abnormal dentition. No oropharyngeal exudate.  Eyes: EOM are normal. Pupils are equal, round, and reactive to light.  Neck: Neck supple.  Cardiovascular: Normal rate, regular rhythm and normal heart sounds.   Pulmonary/Chest: Effort normal and breath sounds normal.  Abdominal: Soft. Bowel sounds are normal. He exhibits no distension. There is no tenderness.  Musculoskeletal:       Feet:  Lymphadenopathy:    He has no cervical adenopathy.          Assessment & Plan:

## 2011-02-04 NOTE — Assessment & Plan Note (Signed)
Will have him see Lawanna Kobus today.

## 2011-02-04 NOTE — Progress Notes (Signed)
Addended by: Wendall Mola A on: 02/04/2011 04:40 PM   Modules accepted: Orders

## 2011-02-04 NOTE — Assessment & Plan Note (Signed)
Will refill his megace 

## 2011-02-04 NOTE — Assessment & Plan Note (Signed)
Will check ECG today. Suspect that this is panic? Will have him eval for holter monitor. Does not seem to have si/sx to suggest seizure disorder.

## 2011-02-04 NOTE — Assessment & Plan Note (Signed)
He is offered ART but he wishes to defer as he is afraid he will have to start taking it and never stop. He wants to think about it (complera). He will get flu and PNVX today. Given condoms. Return to clinic 2-3 months

## 2011-02-04 NOTE — Assessment & Plan Note (Signed)
Will refer him to podiatry 

## 2011-02-05 ENCOUNTER — Telehealth: Payer: Self-pay | Admitting: *Deleted

## 2011-02-05 NOTE — Telephone Encounter (Signed)
Called and notified patient that Pam from patient assistance checked to see if there was a program for Megace and there is only a co pay card to pick up what insurance does not cover.  Patient is uninsured. Wendall Mola CMA

## 2011-02-11 ENCOUNTER — Telehealth: Payer: Self-pay | Admitting: *Deleted

## 2011-02-11 NOTE — Telephone Encounter (Signed)
I explained again that we have no source for free megace at this time. He does have ensure but has not yet gotten it. Advised him to eat high calorie foods & gave examples. He is coming in to see Britta Mccreedy on the 14th. Told him once we have the [employer] card for him, we can work on the foot doctor referral. He was ok with this answer

## 2011-02-14 ENCOUNTER — Ambulatory Visit: Payer: Self-pay

## 2011-02-18 ENCOUNTER — Ambulatory Visit: Payer: Self-pay

## 2011-03-11 ENCOUNTER — Other Ambulatory Visit: Payer: Self-pay | Admitting: Licensed Clinical Social Worker

## 2011-03-11 NOTE — Telephone Encounter (Signed)
Patient stated that he needed a new refill for Ensure sent to [facility] in [facility]. I called [facility] and they stated that they do not have an RX that they are aware since January of 2012. One was printed on 02/03/2011 but I will print another one and fax it to [facility] in [facility].

## 2011-03-24 ENCOUNTER — Telehealth: Payer: Self-pay | Admitting: *Deleted

## 2011-03-24 NOTE — Telephone Encounter (Signed)
Pt following up on referral to Podiatrist.  Pt was seen ib Dec. 12/12.  Pt contact pt.

## 2011-03-24 NOTE — Telephone Encounter (Signed)
Patient called regarding podiatry referral.  This referral was never made, because her never kept his appointment with Kandice Robinsons to obtain a discount card.  Patient was notified and he said he would call to reschedule. Wendall Mola CMA

## 2011-05-05 ENCOUNTER — Ambulatory Visit: Payer: Self-pay

## 2011-05-05 ENCOUNTER — Other Ambulatory Visit: Payer: Self-pay

## 2011-05-06 ENCOUNTER — Telehealth: Payer: Self-pay | Admitting: *Deleted

## 2011-05-06 ENCOUNTER — Other Ambulatory Visit: Payer: Self-pay

## 2011-05-06 NOTE — Telephone Encounter (Addendum)
Notified patient he missed his lab appointment, rescheduled for tomorrow, 05/06/11. Wendall Mola CMA

## 2011-05-07 ENCOUNTER — Other Ambulatory Visit (INDEPENDENT_AMBULATORY_CARE_PROVIDER_SITE_OTHER): Payer: Self-pay

## 2011-05-07 DIAGNOSIS — B20 Human immunodeficiency virus [HIV] disease: Secondary | ICD-10-CM

## 2011-05-07 LAB — CBC WITH DIFFERENTIAL/PLATELET
Basophils Relative: 0 % (ref 0–1)
Eosinophils Absolute: 0 10*3/uL (ref 0.0–0.7)
Eosinophils Relative: 1 % (ref 0–5)
HCT: 41.1 % (ref 39.0–52.0)
Hemoglobin: 13.6 g/dL (ref 13.0–17.0)
Lymphs Abs: 1.9 10*3/uL (ref 0.7–4.0)
MCH: 27.6 pg (ref 26.0–34.0)
MCHC: 33.1 g/dL (ref 30.0–36.0)
MCV: 83.5 fL (ref 78.0–100.0)
Monocytes Absolute: 0.6 10*3/uL (ref 0.1–1.0)
Monocytes Relative: 9 % (ref 3–12)
RBC: 4.92 MIL/uL (ref 4.22–5.81)

## 2011-05-07 LAB — COMPLETE METABOLIC PANEL WITH GFR
BUN: 15 mg/dL (ref 6–23)
CO2: 28 mEq/L (ref 19–32)
GFR, Est African American: >89 mL/min
GFR, Est Non African American: >89 mL/min
Glucose, Bld: 72 mg/dL (ref 70–99)
Sodium: 139 mEq/L (ref 135–145)
Total Bilirubin: 0.5 mg/dL (ref 0.3–1.2)
Total Protein: 7.3 g/dL (ref 6.0–8.3)

## 2011-05-08 LAB — T-HELPER CELL (CD4) - (RCID CLINIC ONLY): CD4 T Cell Abs: 530 uL (ref 400–2700)

## 2011-05-09 LAB — HIV-1 RNA QUANT-NO REFLEX-BLD: HIV 1 RNA Quant: 35217 copies/mL — ABNORMAL HIGH (ref <20)

## 2011-05-19 ENCOUNTER — Telehealth: Payer: Self-pay | Admitting: *Deleted

## 2011-05-19 ENCOUNTER — Ambulatory Visit: Payer: Self-pay

## 2011-05-19 ENCOUNTER — Ambulatory Visit (INDEPENDENT_AMBULATORY_CARE_PROVIDER_SITE_OTHER): Payer: Self-pay | Admitting: Infectious Diseases

## 2011-05-19 ENCOUNTER — Encounter: Payer: Self-pay | Admitting: Infectious Diseases

## 2011-05-19 VITALS — BP 124/81 | HR 84 | Temp 98.3°F | Wt 115.0 lb

## 2011-05-19 DIAGNOSIS — F411 Generalized anxiety disorder: Secondary | ICD-10-CM

## 2011-05-19 DIAGNOSIS — R630 Anorexia: Secondary | ICD-10-CM

## 2011-05-19 DIAGNOSIS — B20 Human immunodeficiency virus [HIV] disease: Secondary | ICD-10-CM

## 2011-05-19 MED ORDER — ENSURE PO LIQD: 1.0000 | Freq: Three times a day (TID) | ORAL | Status: DC

## 2011-05-19 MED ORDER — MEGESTROL ACETATE 625 MG/5ML PO SUSP: 625.0000 mg | Freq: Every day | ORAL | Status: DC

## 2011-05-19 NOTE — Telephone Encounter (Signed)
dental form in [facility]. Needs asap appt.  Printed app for [employer]. Needs to bring in proof of income before it can be sent

## 2011-05-19 NOTE — Assessment & Plan Note (Signed)
approached him about starting complera. Explained benefits of ART- improved immune control, decreased transmission. He wishes to defer. Vaccines are up to date. Given condoms. rtc 5-6 months.

## 2011-05-19 NOTE — Progress Notes (Signed)
  Subjective:    Patient ID: Rodney Chase, male    DOB: 1975/10/19, 36 y.o.   MRN: [id number]  HPI 36 yo M with hx of anxiety, HIV+ (dx 8-07), not on Rx.  Had Naive genotype 08-30-08. Was referred to the start study and is in defer arm.  Still taking megace and ensure. Wt has been down (was up to 148).  Still having some issues with depression/anxiety.   HIV 1 RNA Quant (copies/mL)  Date Value  05/07/2011 35217*  01/03/2011 28500*  02/28/2010 67700*     CD4 T Cell Abs (cmm)  Date Value  05/07/2011 530   01/03/2011 510   02/28/2010 770        Review of Systems  Constitutional: Positive for unexpected weight change. Negative for appetite change.  Gastrointestinal: Negative for diarrhea and constipation.  Genitourinary: Negative for dysuria.       Objective:   Physical Exam  Constitutional: He is oriented to person, place, and time. He appears well-developed and well-nourished.  HENT:  Mouth/Throat: No oropharyngeal exudate.  Eyes: EOM are normal. Pupils are equal, round, and reactive to light.  Neck: Neck supple.  Cardiovascular: Normal rate, regular rhythm and normal heart sounds.   Pulmonary/Chest: Effort normal and breath sounds normal.  Abdominal: Soft. Bowel sounds are normal. There is no tenderness.  Lymphadenopathy:    He has no cervical adenopathy.  Neurological: He is alert and oriented to person, place, and time.          Assessment & Plan:

## 2011-05-19 NOTE — Assessment & Plan Note (Signed)
Seems fairly well controlled today.

## 2011-05-19 NOTE — Assessment & Plan Note (Signed)
Refill his megace and marinol.

## 2011-05-20 ENCOUNTER — Telehealth: Payer: Self-pay | Admitting: *Deleted

## 2011-05-20 NOTE — Telephone Encounter (Signed)
He had told me yesterday that his housing is free & he gets $71 towards the utilities. He has no income & his family & friends buy him groceries. States he did not work or have income last year. I asked him to compose a letter stating all this & have it notarized. Bring it here or fax or mail. States he will

## 2011-05-26 ENCOUNTER — Telehealth: Payer: Self-pay | Admitting: Infectious Diseases

## 2011-05-26 NOTE — Telephone Encounter (Signed)
Received call from patient wanting to know about his application for Megace.  I told him we are waiting for his letter proving no income.  We were cut off.  I called him back and left a message to call me back.

## 2011-06-16 ENCOUNTER — Telehealth: Payer: Self-pay | Admitting: Infectious Diseases

## 2011-06-16 NOTE — Telephone Encounter (Signed)
Faxed and mailed application to [facility] Patient Assistance today.

## 2011-06-19 ENCOUNTER — Other Ambulatory Visit: Payer: Self-pay | Admitting: *Deleted

## 2011-06-19 DIAGNOSIS — B20 Human immunodeficiency virus [HIV] disease: Secondary | ICD-10-CM

## 2011-06-19 DIAGNOSIS — R630 Anorexia: Secondary | ICD-10-CM

## 2011-06-19 MED ORDER — MEGESTROL ACETATE 625 MG/5ML PO SUSP: 625.0000 mg | Freq: Every day | ORAL | Status: DC

## 2011-06-19 NOTE — Telephone Encounter (Signed)
He will pick up the free med that came in today when he can get here

## 2011-07-04 ENCOUNTER — Other Ambulatory Visit: Payer: Self-pay | Admitting: *Deleted

## 2011-07-04 NOTE — Telephone Encounter (Signed)
He came by & got the megace that he gets thru pt assistance

## 2011-07-25 ENCOUNTER — Encounter: Payer: Self-pay | Admitting: *Deleted

## 2011-07-25 NOTE — Progress Notes (Signed)
Patient ID: Rodney Chase, male   DOB: 1975/07/19, 36 y.o.   MRN: [id number]  Pt called to get an update on his referral to podiatry in 02/2011. After reviewing his chart I found he was never referred because he did not meet with Kandice Robinsons to apply for [facility] card. I called pt ([phone]) and let him know he needs to schedule appt with Britta Mccreedy to apply for [facility] card then I can refer him to a podiatrist in the network. He verbalized understanding and give him Barabara's number so he could get an appt with her. I told him he could call me once he obtained this card and I would also look periodically to see if he had been approved. Tacey Heap RN

## 2011-09-15 ENCOUNTER — Telehealth: Payer: Self-pay | Admitting: *Deleted

## 2011-09-15 NOTE — Telephone Encounter (Signed)
Pt described the feeling as though someone was pulling on his mouth/cheek.  This has been happening for the past 3 days.  A nurse friend of his told him that he might be having a stroke and that was the reason that he was calling today.  Pt is not having any other symptoms; no memory problems, no weakness, no vision problems, no slurred speech and no other issues.  RN advised the pt to take an aspirin today and one tomorrow if he doesn't get to the hospital today.  Pt does not have any insurance.  RN advised that [facility] does not have any available appts this week.  Advised pt to go to UC/ED for evaluation.  Pt does not have transportation.  Advised pt to be seen as soon as possible.  Pt stated that he may be able to get to [facility] tomorrow.  Pt advised to ask to speak with a [facility] Financial Advisor to obtain a [facility] discount card when he comes to [facility].  Advised to bring 2012 Tax forms when he comes to the hospital.  Pt given return appt for lab work and MD for August 2013 w/ Dr. Ninetta Lights.  Pt verbalized understanding of all advice given.  Was able to verbalize back all information.

## 2011-09-16 ENCOUNTER — Other Ambulatory Visit: Payer: Self-pay

## 2011-09-29 ENCOUNTER — Other Ambulatory Visit: Payer: Self-pay

## 2011-10-04 ENCOUNTER — Encounter (HOSPITAL_BASED_OUTPATIENT_CLINIC_OR_DEPARTMENT_OTHER): Payer: Self-pay

## 2011-10-04 ENCOUNTER — Emergency Department (HOSPITAL_BASED_OUTPATIENT_CLINIC_OR_DEPARTMENT_OTHER)
Admission: EM | Admit: 2011-10-04 | Discharge: 2011-10-05 | Disposition: A | Discharge status: Home / Self Care | Payer: Medicaid Other | Attending: Emergency Medicine | Admitting: Emergency Medicine

## 2011-10-04 DIAGNOSIS — I10 Essential (primary) hypertension: Secondary | ICD-10-CM | POA: Insufficient documentation

## 2011-10-04 DIAGNOSIS — E119 Type 2 diabetes mellitus without complications: Secondary | ICD-10-CM | POA: Insufficient documentation

## 2011-10-04 DIAGNOSIS — R51 Headache: Secondary | ICD-10-CM | POA: Insufficient documentation

## 2011-10-04 HISTORY — DX: Essential (primary) hypertension: I10

## 2011-10-04 NOTE — ED Provider Notes (Addendum)
History   This chart was scribed for Gwyneth Sprout, MD scribed by Magnus Sinning. The patient was seen in room MH05/MH05 seen at 23:53   CSN: [id number]  Arrival date & time 10/04/11  2315   First MD Initiated Contact with Patient 10/04/11 2351      Chief Complaint  Patient presents with  . Headache    (Consider location/radiation/quality/duration/timing/severity/associated sxs/prior treatment) Patient is a 36 y.o. male presenting with headaches.  Headache  Pertinent negatives include no fever.   Peter Woods is a 36 y.o. male who presents to the Emergency Department complaining of constant moderate HA that is all over, onset 3 hours ago.  States he ate a salad approximately 7 hours ago and he explains he has not been resting well since he has been playing music all this week. Patient has hx similar HAs and says he has not taken any medications to relived HA today. Patient has hx of DM and says he checks it regularly. States yesterday it was at about 130 and that it usually in the 100s. Patient also has HTN and says he took his medication this morning.  Denies fevers, vision changes, numbness, tingling, nausea, or hx of asthma.   Past Medical History  Diagnosis Date  . Diabetes mellitus   . Hypertension     History reviewed. No pertinent past surgical history.  No family history on file.  History  Substance Use Topics  . Smoking status: Never Smoker   . Smokeless tobacco: Not on file  . Alcohol Use: No      Review of Systems  Constitutional: Negative for fever.  Respiratory:       States mild congestion recently and cough  Neurological: Positive for headaches.  All other systems reviewed and are negative.   10 Systems reviewed and are negative for acute change except as noted in the HPI. Allergies  Review of patient's allergies indicates no known allergies.  Home Medications   Current Outpatient Rx  Name Route Sig Dispense Refill  . BENAZEPRIL HCL 20 MG PO  TABS Oral Take 20 mg by mouth daily.    Marland Kitchen METFORMIN HCL 500 MG PO TABS Oral Take 500 mg by mouth 2 (two) times daily with a meal.     BP 161/110  Pulse 85  Temp 98 F (36.7 C) (Oral)  Resp 16  Ht 5\' 6"  (1.676 m)  Wt 280 lb (127.007 kg)  BMI 45.19 kg/m2  SpO2 97%  Physical Exam  Nursing note and vitals reviewed. Constitutional: He is oriented to person, place, and time. He appears well-developed and well-nourished. No distress.  HENT:  Head: Normocephalic and atraumatic.  Right Ear: External ear normal.  Left Ear: External ear normal.  Eyes: Conjunctivae and EOM are normal.  Neck: Neck supple. No tracheal deviation present.  Cardiovascular: Normal rate.   Pulmonary/Chest: Effort normal. No respiratory distress. He has wheezes (Scant).  Abdominal: He exhibits no distension.  Musculoskeletal: Normal range of motion. He exhibits no edema.  Neurological: He is alert and oriented to person, place, and time. He has normal strength. No cranial nerve deficit or sensory deficit. Coordination and gait normal.       Nml gait  Skin: Skin is warm and dry.  Psychiatric: He has a normal mood and affect. His behavior is normal.    ED Course  Procedures (including critical care time) DIAGNOSTIC STUDIES: Oxygen Saturation is 97% on room air, normal by my interpretation.    COORDINATION OF CARE:  Labs Reviewed  GLUCOSE, CAPILLARY - Abnormal; Notable for the following:    Glucose-Capillary 103 (*)     All other components within normal limits   No results found.   1. Headache       MDM   Pt with mild HA tonight.  Pt withtypical of a nonspecific HA without sx suggestive of SAH(sudden onset, worst of life, or deficits), infection, or cavernous vein thrombosis.  Normal neuro exam and vital signs. Pt given ibuprofen for the HA.  Blood sugar normal at 103 today and BP persistently elevated at 161/108.  Pt states usually takes BP meds in the am.  Doubt this is the reason for his  HA. Will d/c home.  I personally performed the services described in this documentation, which was scribed in my presence.  The recorded information has been reviewed and considered.        Gwyneth Sprout, MD 10/05/11 4782  Gwyneth Sprout, MD 10/05/11 9562

## 2011-10-04 NOTE — ED Notes (Signed)
Patient here with generalized headache since this evening. Denies any other associated symptoms, denies trauma. Patient alert and oriented

## 2011-10-05 MED ORDER — IBUPROFEN 800 MG PO TABS
800.0000 mg | ORAL_TABLET | Freq: Once | ORAL | Status: AC
  Administered 2011-10-05: 800 mg via ORAL
  Filled 2011-10-05: qty 1

## 2011-10-05 NOTE — ED Notes (Signed)
Resting quietly. Eyes closed.

## 2011-10-05 NOTE — ED Notes (Signed)
MD at bedside. 

## 2011-10-14 ENCOUNTER — Ambulatory Visit: Payer: Self-pay

## 2011-10-14 ENCOUNTER — Ambulatory Visit: Payer: Self-pay | Admitting: *Deleted

## 2011-10-14 ENCOUNTER — Other Ambulatory Visit: Payer: Self-pay | Admitting: Infectious Diseases

## 2011-10-14 ENCOUNTER — Other Ambulatory Visit (INDEPENDENT_AMBULATORY_CARE_PROVIDER_SITE_OTHER): Payer: Self-pay

## 2011-10-14 ENCOUNTER — Ambulatory Visit: Payer: Self-pay | Admitting: Infectious Diseases

## 2011-10-14 VITALS — BP 148/89 | HR 80 | Ht 62.0 in | Wt 125.5 lb

## 2011-10-14 DIAGNOSIS — B20 Human immunodeficiency virus [HIV] disease: Secondary | ICD-10-CM

## 2011-10-14 DIAGNOSIS — Z0000 Encounter for general adult medical examination without abnormal findings: Secondary | ICD-10-CM

## 2011-10-14 LAB — COMPREHENSIVE METABOLIC PANEL
ALT: 11 U/L (ref 0–53)
CO2: 28 mEq/L (ref 19–32)
Calcium: 9.5 mg/dL (ref 8.4–10.5)
Chloride: 104 mEq/L (ref 96–112)
Creat: 0.94 mg/dL (ref 0.50–1.35)
Glucose, Bld: 105 mg/dL — ABNORMAL HIGH (ref 70–99)
Sodium: 140 mEq/L (ref 135–145)
Total Protein: 7.1 g/dL (ref 6.0–8.3)

## 2011-10-14 LAB — CBC WITH DIFFERENTIAL/PLATELET
Eosinophils Relative: 1 % (ref 0–5)
HCT: 39.5 % (ref 39.0–52.0)
Hemoglobin: 13.3 g/dL (ref 13.0–17.0)
Lymphocytes Relative: 35 % (ref 12–46)
Lymphs Abs: 1.6 10*3/uL (ref 0.7–4.0)
MCH: 28.1 pg (ref 26.0–34.0)
MCV: 83.5 fL (ref 78.0–100.0)
Monocytes Relative: 9 % (ref 3–12)
Platelets: 235 10*3/uL (ref 150–400)
RBC: 4.73 MIL/uL (ref 4.22–5.81)
WBC: 4.6 10*3/uL (ref 4.0–10.5)

## 2011-10-14 NOTE — Progress Notes (Signed)
Pt reports headaches every morning for the past 2 weeks located over the back of his head and/or the left eye at a "6.5-7 / 10" on pain scale.   Patient does exercise with his dogs daily.  Pt admits to consuming packaged luncheon meats and snacks and not drinking plain water only tea or juice.  RN advised reading the package labels for the sodium content and drinking more plain water during the day.  Pt verbalized understanding of this advice.  Pt stated that he was previously on a blood pressure medication but that this was discontinued several years ago.  He does not have a family history of blood pressure problems.   He has an appt with Dr. Ninetta Lights on October 1.

## 2011-10-15 LAB — T-HELPER CELL (CD4) - (RCID CLINIC ONLY)
CD4 % Helper T Cell: 31 % — ABNORMAL LOW (ref 33–55)
CD4 T Cell Abs: 480 uL (ref 400–2700)

## 2011-11-04 ENCOUNTER — Other Ambulatory Visit: Payer: Self-pay | Admitting: *Deleted

## 2011-11-04 DIAGNOSIS — B20 Human immunodeficiency virus [HIV] disease: Secondary | ICD-10-CM

## 2011-11-04 MED ORDER — ENSURE PO LIQD: 1.0000 | Freq: Three times a day (TID) | ORAL | Status: DC

## 2011-12-02 ENCOUNTER — Ambulatory Visit: Payer: Self-pay | Admitting: Infectious Diseases

## 2011-12-11 ENCOUNTER — Ambulatory Visit: Payer: Self-pay | Admitting: Infectious Diseases

## 2011-12-11 ENCOUNTER — Telehealth: Payer: Self-pay | Admitting: *Deleted

## 2011-12-11 NOTE — Telephone Encounter (Signed)
Patient referred to [facility] for multiple no shows and cancellations. Wendall Mola CMA

## 2011-12-18 ENCOUNTER — Telehealth: Payer: Self-pay | Admitting: Infectious Diseases

## 2011-12-18 NOTE — Telephone Encounter (Signed)
Received call from Mr. Rodney Chase about his Magace.  I called [name] and re-ordered for him.   He has applied for ADAP so this should be the last for him on Patient Assistance

## 2011-12-20 ENCOUNTER — Emergency Department (HOSPITAL_BASED_OUTPATIENT_CLINIC_OR_DEPARTMENT_OTHER)
Admission: EM | Admit: 2011-12-20 | Discharge: 2011-12-20 | Disposition: A | Discharge status: Home / Self Care | Payer: Medicare Other | Attending: Emergency Medicine | Admitting: Emergency Medicine

## 2011-12-20 ENCOUNTER — Encounter (HOSPITAL_BASED_OUTPATIENT_CLINIC_OR_DEPARTMENT_OTHER): Payer: Self-pay | Admitting: *Deleted

## 2011-12-20 ENCOUNTER — Emergency Department (HOSPITAL_BASED_OUTPATIENT_CLINIC_OR_DEPARTMENT_OTHER): Payer: Medicare Other

## 2011-12-20 DIAGNOSIS — R109 Unspecified abdominal pain: Secondary | ICD-10-CM

## 2011-12-20 DIAGNOSIS — E119 Type 2 diabetes mellitus without complications: Secondary | ICD-10-CM | POA: Insufficient documentation

## 2011-12-20 DIAGNOSIS — I10 Essential (primary) hypertension: Secondary | ICD-10-CM | POA: Insufficient documentation

## 2011-12-20 DIAGNOSIS — K5900 Constipation, unspecified: Secondary | ICD-10-CM

## 2011-12-20 LAB — URINALYSIS, ROUTINE W REFLEX MICROSCOPIC
Bilirubin Urine: NEGATIVE
Glucose, UA: NEGATIVE mg/dL
Hgb urine dipstick: NEGATIVE
Ketones, ur: NEGATIVE mg/dL
Leukocytes, UA: NEGATIVE
Nitrite: NEGATIVE
Protein, ur: NEGATIVE mg/dL
Specific Gravity, Urine: 1.016 (ref 1.005–1.030)
Urobilinogen, UA: 1 mg/dL (ref 0.0–1.0)
pH: 7.5 (ref 5.0–8.0)

## 2011-12-20 LAB — GLUCOSE, CAPILLARY: Glucose-Capillary: 112 mg/dL — ABNORMAL HIGH (ref 70–99)

## 2011-12-20 MED ORDER — GI COCKTAIL ~~LOC~~
30.0000 mL | Freq: Once | ORAL | Status: AC
  Administered 2011-12-20: 30 mL via ORAL
  Filled 2011-12-20: qty 30

## 2011-12-20 MED ORDER — POLYETHYLENE GLYCOL 3350 17 G PO PACK: 17.0000 g | PACK | Freq: Every day | ORAL | Status: DC

## 2011-12-20 MED ORDER — IBUPROFEN 800 MG PO TABS: 800.0000 mg | ORAL_TABLET | Freq: Three times a day (TID) | ORAL | Status: DC

## 2011-12-20 MED ORDER — PANTOPRAZOLE SODIUM 40 MG PO TBEC: 40.0000 mg | DELAYED_RELEASE_TABLET | Freq: Every day | ORAL | Status: DC

## 2011-12-20 MED ORDER — IBUPROFEN 800 MG PO TABS
800.0000 mg | ORAL_TABLET | Freq: Once | ORAL | Status: AC
  Administered 2011-12-20: 800 mg via ORAL
  Filled 2011-12-20: qty 1

## 2011-12-20 NOTE — ED Notes (Signed)
Pt reports low ab pain mainly on right side that radiates to left intermittently.  Pt report no UTI sx and no injury or trauma

## 2011-12-20 NOTE — Discharge Instructions (Signed)
Abdominal Pain  Abdominal pain can be caused by many things. Your caregiver decides the seriousness of your pain by an examination and possibly blood tests and X-rays. Many cases can be observed and treated at home. Most abdominal pain is not caused by a disease and will probably improve without treatment. However, in many cases, more time must pass before a clear cause of the pain can be found. Before that point, it may not be known if you need more testing, or if hospitalization or surgery is needed.  HOME CARE INSTRUCTIONS    Do not take laxatives unless directed by your caregiver.   Take pain medicine only as directed by your caregiver.   Only take over-the-counter or prescription medicines for pain, discomfort, or fever as directed by your caregiver.   Try a clear liquid diet (broth, tea, or water) for as long as directed by your caregiver. Slowly move to a bland diet as tolerated.  SEEK IMMEDIATE MEDICAL CARE IF:    The pain does not go away.   You have a fever.   You keep throwing up (vomiting).   The pain is felt only in portions of the abdomen. Pain in the right side could possibly be appendicitis. In an adult, pain in the left lower portion of the abdomen could be colitis or diverticulitis.   You pass bloody or black tarry stools.  MAKE SURE YOU:    Understand these instructions.   Will watch your condition.   Will get help right away if you are not doing well or get worse.  Document Released: 11/27/2004 Document Revised: 05/12/2011 Document Reviewed: 10/06/2007  ExitCare Patient Information 2013 [employer].      Constipation, Adult  Constipation is when a person has fewer than 3 bowel movements a week; has difficulty having a bowel movement; or has stools that are dry, hard, or larger than normal. As people grow older, constipation is more common. If you try to fix constipation with medicines that make you have a bowel movement (laxatives), the problem may get worse. Long-term laxative  use may cause the muscles of the colon to become weak. A low-fiber diet, not taking in enough fluids, and taking certain medicines may make constipation worse.  CAUSES    Certain medicines, such as antidepressants, pain medicine, iron supplements, antacids, and water pills.    Certain diseases, such as diabetes, irritable bowel syndrome (IBS), thyroid disease, or depression.    Not drinking enough water.    Not eating enough fiber-rich foods.    Stress or travel.   Lack of physical activity or exercise.   Not going to the restroom when there is the urge to have a bowel movement.   Ignoring the urge to have a bowel movement.   Using laxatives too much.  SYMPTOMS    Having fewer than 3 bowel movements a week.    Straining to have a bowel movement.    Having hard, dry, or larger than normal stools.    Feeling full or bloated.    Pain in the lower abdomen.   Not feeling relief after having a bowel movement.  DIAGNOSIS   Your caregiver will take a medical history and perform a physical exam. Further testing may be done for severe constipation. Some tests may include:    A barium enema X-ray to examine your rectum, colon, and sometimes, your small intestine.   A sigmoidoscopy to examine your lower colon.   A colonoscopy to examine your entire colon.    TREATMENT   Treatment will depend on the severity of your constipation and what is causing it. Some dietary treatments include drinking more fluids and eating more fiber-rich foods. Lifestyle treatments may include regular exercise. If these diet and lifestyle recommendations do not help, your caregiver may recommend taking over-the-counter laxative medicines to help you have bowel movements. Prescription medicines may be prescribed if over-the-counter medicines do not work.   HOME CARE INSTRUCTIONS    Increase dietary fiber in your diet, such as fruits, vegetables, whole grains, and beans. Limit high-fat and processed sugars in your diet, such as  French fries, hamburgers, cookies, candies, and soda.    A fiber supplement may be added to your diet if you cannot get enough fiber from foods.    Drink enough fluids to keep your urine clear or pale yellow.    Exercise regularly or as directed by your caregiver.    Go to the restroom when you have the urge to go. Do not hold it.   Only take medicines as directed by your caregiver. Do not take other medicines for constipation without talking to your caregiver first.  SEEK IMMEDIATE MEDICAL CARE IF:    You have bright red blood in your stool.    Your constipation lasts for more than 4 days or gets worse.    You have abdominal or rectal pain.    You have thin, pencil-like stools.   You have unexplained weight loss.  MAKE SURE YOU:    Understand these instructions.   Will watch your condition.   Will get help right away if you are not doing well or get worse.  Document Released: 11/16/2003 Document Revised: 05/12/2011 Document Reviewed: 01/21/2011  [employer] Patient Information 2013 [employer].

## 2011-12-20 NOTE — ED Notes (Signed)
Pt presents to ED today with low abdominal pain for the last month that pt reports is intermittent.  Pt states pain worsening over the last few days after working at [employer] .

## 2011-12-20 NOTE — ED Provider Notes (Addendum)
History     CSN: [id number]  Arrival date & time 12/20/11  4098   First MD Initiated Contact with Patient 12/20/11 0759      Chief Complaint  Patient presents with  . 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  No numbness, weakness in legs.  No prior back injury.  (Although later reports he wants his arms and shoulders looked at due to a fall many weeks ago and worries about his ability to play the organ).  He reports pain seems worse in the morning.  Gets better during the day.  He had a massage previously which helped pain for about 2 days, but got worse again.  Has not taken any meds for this.  Occasionally when he bends forward to reach to the ground, pain gets much worse.  Not worse with straining, coughing. No hernia or mass, no testicular pain, no discharge.  No rash, no N/V/D.    Patient is a 36 y.o. male presenting with abdominal pain. The history is provided by the patient.  Abdominal Pain The primary symptoms of the illness include abdominal pain. The primary symptoms of the illness do not include fever, nausea, vomiting, diarrhea or dysuria.  Additional symptoms associated with the illness include back pain. Symptoms associated with the illness do not include chills.    Past Medical History  Diagnosis Date  . Diabetes mellitus   . Hypertension     History reviewed. No pertinent past surgical history.  History reviewed. No pertinent family history.  History  Substance Use Topics  . Smoking status: Never Smoker   . Smokeless tobacco: Not on file  . Alcohol Use: No      Review of Systems  Constitutional: Negative for fever,  chills and appetite change.  Gastrointestinal: Positive for abdominal pain. Negative for nausea, vomiting and diarrhea.  Genitourinary: Negative for dysuria, flank pain, discharge, penile pain and testicular pain.  Musculoskeletal: Positive for back pain.  Skin: Negative for rash.    Allergies  Review of patient's allergies indicates no known allergies.  Home Medications   Current Outpatient Rx  Name Route Sig Dispense Refill  . BENAZEPRIL HCL 20 MG PO TABS Oral Take 20 mg by mouth daily.    . IBUPROFEN 800 MG PO TABS Oral Take 1 tablet (800 mg total) by mouth 3 (three) times daily. 21 tablet 0  . METFORMIN HCL 500 MG PO TABS Oral Take 500 mg by mouth 2 (two) times daily with a meal.    . PANTOPRAZOLE SODIUM 40 MG PO TBEC Oral Take 1 tablet (40 mg total) by mouth daily. 14 tablet 0  . POLYETHYLENE GLYCOL 3350 PO PACK Oral Take 17 g by mouth daily. As needed until movement occurs 10 each 0    BP 127/107  Pulse 115  Temp 98.4 F (36.9 C)  Resp 20  SpO2 97%  Physical Exam  Nursing note and vitals reviewed. Constitutional: He appears well-developed and well-nourished.  HENT:  Head: Normocephalic and atraumatic.  Cardiovascular: Normal rate, regular rhythm and normal heart sounds.        Initially tachycardic in triage, normal on my exam  Pulmonary/Chest: Effort normal. No respiratory distress. He has no wheezes.  Abdominal: Soft. Normal appearance and bowel sounds are normal. He exhibits no distension. There is no hepatosplenomegaly. There is no tenderness. There is no rebound, no guarding and no CVA tenderness. Hernia confirmed negative in the right inguinal area and confirmed negative in the left inguinal area.  Genitourinary: Penis normal. Right testis shows no tenderness. Left testis shows no tenderness.  Skin: Skin is warm.    ED Course  Procedures (including critical care time)  Labs Reviewed  GLUCOSE, CAPILLARY - Abnormal; Notable for the following:     Glucose-Capillary 112 (*)     All other components within normal limits  URINALYSIS, ROUTINE W REFLEX MICROSCOPIC   Dg Abd Acute W/chest  12/20/2011  *RADIOLOGY REPORT*  Clinical Data: Lower abdominal pain, worsening over the past few months  ACUTE ABDOMEN SERIES (ABDOMEN 2 VIEW & CHEST 1 VIEW)  Comparison: None.  Findings: Cardiac and mediastinal contours appear normal.  The lungs appear clear.  No pleural effusion is identified.  Gas and stool is present in the colon.  Small bowel gasless, which is frequently incidental but technically nonspecific.  No abnormal air-fluid levels observed.  Old, healed left lower posterolateral rib fractures noted.  IMPRESSION:  1. No specific significant abnormality is identified.   Original Report Authenticated By: Dellia Cloud, M.D.      1. Abdominal pain   2. Constipation     ra sat is 97% and i interpret to be normal.     8:45 AM I reviewed 3 view abdomen and chest acute series.  No air fluid levels.  No free air.  Lungs clear.  Heart size ok.  Moderate stool seen throughout large intestines.     Plan is to discharge home on NSAIDs and give miralax and have him follow up with PCP next week if symptoms are not improving at that point.  Return for sudden worse pain, high fevers, vomiting.    MDM  Pt has different complaints, seems to contradict himself.  Pain has been present for months making this chronic.  No fever, no guard or rebound on exam.  Pt is reassured.  Will get UA, plain films for his reassurance.  Pt doesn't drink alcohol.  He has not taken anything for this.  It is reproducible with motion making this most likely muscular in nature in my opinion.          Peter Woods. Oletta Lamas, MD 12/20/11 2956  Peter Woods. Oletta Lamas, MD 12/20/11 2130  Peter Woods. Oletta Lamas, MD 12/20/11 8657

## 2011-12-20 NOTE — ED Notes (Signed)
The patient's CBG was 112.

## 2011-12-24 ENCOUNTER — Telehealth: Payer: Self-pay | Admitting: *Deleted

## 2011-12-24 NOTE — Telephone Encounter (Signed)
RN checked appt for pt.  Seeing Dr Ninetta Lights 12/31/11 @ 1600.  RN checked to see if labs had been drawn, done on 10/14/11.  [place] [address], CD4 480.  RN advised pt be sure to keep his appt w/ Dr. Ninetta Lights.  Pt also was advised to contact Ardis Rowan, eligibility coordinator prior to his visit.  Pt given phone number for B. Jeraldine Loots.

## 2011-12-29 ENCOUNTER — Telehealth: Payer: Self-pay | Admitting: Infectious Diseases

## 2011-12-29 NOTE — Telephone Encounter (Signed)
Called Mr. Rehor about coming in to sign his application for Megace.  Unable to leave messge.  Mail box full.

## 2011-12-31 ENCOUNTER — Telehealth: Payer: Self-pay | Admitting: *Deleted

## 2011-12-31 ENCOUNTER — Ambulatory Visit: Payer: Self-pay | Admitting: Infectious Diseases

## 2011-12-31 ENCOUNTER — Ambulatory Visit: Payer: Self-pay

## 2011-12-31 NOTE — Telephone Encounter (Signed)
Patient referred to [facility] for multiple no shows. Rodney Chase  

## 2012-01-06 ENCOUNTER — Encounter: Payer: Self-pay | Admitting: Infectious Diseases

## 2012-01-06 ENCOUNTER — Ambulatory Visit (INDEPENDENT_AMBULATORY_CARE_PROVIDER_SITE_OTHER): Payer: Self-pay | Admitting: Infectious Diseases

## 2012-01-06 VITALS — BP 140/71 | HR 77 | Temp 97.7°F | Wt 129.0 lb

## 2012-01-06 DIAGNOSIS — B20 Human immunodeficiency virus [HIV] disease: Secondary | ICD-10-CM

## 2012-01-06 DIAGNOSIS — R630 Anorexia: Secondary | ICD-10-CM

## 2012-01-06 DIAGNOSIS — F17200 Nicotine dependence, unspecified, uncomplicated: Secondary | ICD-10-CM

## 2012-01-06 MED ORDER — EMTRICITAB-RILPIVIR-TENOFOV DF 200-25-300 MG PO TABS: 1.0000 | ORAL_TABLET | Freq: Every day | ORAL | Status: DC

## 2012-01-06 NOTE — Assessment & Plan Note (Signed)
Will continue megace.   

## 2012-01-06 NOTE — Assessment & Plan Note (Signed)
Counseled to quit 

## 2012-01-06 NOTE — Progress Notes (Signed)
  Subjective:    Patient ID: Rodney Chase, male    DOB: Jul 16, 1975, 36 y.o.   MRN: [id number]  HPI 36 yo M with hx of anxiety, HIV+ (dx 8-07), not on Rx. Had Naive genotype 08-30-08.  HIV 1 RNA Quant (copies/mL)  Date Value  10/14/2011 27472*  05/07/2011 35217*  01/03/2011 28500*     CD4 T Cell Abs (cmm)  Date Value  10/14/2011 480   05/07/2011 530   01/03/2011 510    Has been having trouble with waking up with backaches. Has been sleeping well.  Still smoking- 1/4 ppd. On the verge of quitting. Still taking megace. Feels like his anxiety level is high.  Ready to start ART   Review of Systems  Constitutional: Positive for fatigue. Negative for appetite change and unexpected weight change.  Respiratory: Negative for cough and shortness of breath.   Gastrointestinal: Negative for diarrhea and constipation.  Genitourinary: Negative for difficulty urinating.  Neurological: Negative for headaches.       Objective:   Physical Exam  Constitutional: He appears well-developed and well-nourished.  HENT:  Mouth/Throat: No oropharyngeal exudate.  Eyes: EOM are normal. Pupils are equal, round, and reactive to light.  Neck: Neck supple.  Cardiovascular: Normal rate, regular rhythm and normal heart sounds.   Pulmonary/Chest: Effort normal and breath sounds normal. No respiratory distress. He has no wheezes. He has no rales.  Abdominal: Soft. Bowel sounds are normal. He exhibits no distension. There is no tenderness. There is no rebound.  Musculoskeletal: He exhibits no edema.  Lymphadenopathy:    He has no cervical adenopathy.          Assessment & Plan:

## 2012-01-06 NOTE — Assessment & Plan Note (Addendum)
He is ready to start art. Will give him rx for complera and get him appt with ADAP counselor. Counseled him to take with food. Refuses flu shot. Given condoms. He is interested in having larger buttocks, he does not have fat to donate to himself. He would silicone injections (more importantly a Engineer, petroleum who would be willing to take this on...).

## 2012-02-02 ENCOUNTER — Other Ambulatory Visit: Payer: Self-pay | Admitting: Licensed Clinical Social Worker

## 2012-02-02 DIAGNOSIS — B20 Human immunodeficiency virus [HIV] disease: Secondary | ICD-10-CM

## 2012-02-02 DIAGNOSIS — R630 Anorexia: Secondary | ICD-10-CM

## 2012-02-02 MED ORDER — EMTRICITAB-RILPIVIR-TENOFOV DF 200-25-300 MG PO TABS: 1.0000 | ORAL_TABLET | Freq: Every day | ORAL | Status: DC

## 2012-02-02 MED ORDER — MEGESTROL ACETATE 625 MG/5ML PO SUSP: 625.0000 mg | Freq: Every day | ORAL | Status: DC

## 2012-02-05 ENCOUNTER — Encounter: Payer: Self-pay | Admitting: Infectious Diseases

## 2012-02-05 ENCOUNTER — Ambulatory Visit (INDEPENDENT_AMBULATORY_CARE_PROVIDER_SITE_OTHER): Payer: No Typology Code available for payment source | Admitting: Infectious Diseases

## 2012-02-05 ENCOUNTER — Inpatient Hospital Stay (HOSPITAL_COMMUNITY): Admission: RE | Admit: 2012-02-05 | Payer: Self-pay | Source: Ambulatory Visit

## 2012-02-05 VITALS — BP 113/82 | HR 90 | Temp 98.9°F | Ht 63.0 in | Wt 126.8 lb

## 2012-02-05 DIAGNOSIS — F17200 Nicotine dependence, unspecified, uncomplicated: Secondary | ICD-10-CM

## 2012-02-05 DIAGNOSIS — Z0000 Encounter for general adult medical examination without abnormal findings: Secondary | ICD-10-CM

## 2012-02-05 DIAGNOSIS — B20 Human immunodeficiency virus [HIV] disease: Secondary | ICD-10-CM

## 2012-02-05 DIAGNOSIS — R630 Anorexia: Secondary | ICD-10-CM

## 2012-02-05 DIAGNOSIS — Z79899 Other long term (current) drug therapy: Secondary | ICD-10-CM

## 2012-02-05 DIAGNOSIS — Z113 Encounter for screening for infections with a predominantly sexual mode of transmission: Secondary | ICD-10-CM

## 2012-02-05 NOTE — Assessment & Plan Note (Signed)
Has cut down, encouraged him to quit.

## 2012-02-05 NOTE — Progress Notes (Signed)
  Subjective:    Patient ID: Rodney Chase, male    DOB: May 11, 1975, 36 y.o.   MRN: [id number]  HPI 36 yo M with hx of anxiety, HIV+ (dx 8-07), written complera (01-2012). Had Naive genotype 08-30-08. Just started his ART today. Wt has been steady. Eating well. Has cut down his tobacco use.  HIV 1 RNA Quant (copies/mL)  Date Value  10/14/2011 27472*  05/07/2011 35217*  01/03/2011 28500*     CD4 T Cell Abs (cmm)  Date Value  10/14/2011 480   05/07/2011 530   01/03/2011 510       Review of Systems     Objective:   Physical Exam  Constitutional: He appears well-developed and well-nourished.  HENT:  Mouth/Throat: No oropharyngeal exudate.    Eyes: EOM are normal. Pupils are equal, round, and reactive to light.  Neck: Neck supple.  Cardiovascular: Normal rate, regular rhythm and normal heart sounds.   Pulmonary/Chest: Effort normal and breath sounds normal.  Abdominal: Soft. Bowel sounds are normal. There is no tenderness.    Genitourinary:     Lymphadenopathy:    He has no cervical adenopathy.          Assessment & Plan:

## 2012-02-05 NOTE — Assessment & Plan Note (Signed)
Sent anal pap. Encouraged him to take new ART, with food. Will see him back in 3 months with labs. Refused flu shot. Given condoms.

## 2012-02-05 NOTE — Assessment & Plan Note (Signed)
Continue megace, ensure

## 2012-03-09 ENCOUNTER — Other Ambulatory Visit: Payer: Self-pay | Admitting: *Deleted

## 2012-03-09 DIAGNOSIS — B20 Human immunodeficiency virus [HIV] disease: Secondary | ICD-10-CM

## 2012-03-09 MED ORDER — EMTRICITAB-RILPIVIR-TENOFOV DF 200-25-300 MG PO TABS: 1.0000 | ORAL_TABLET | Freq: Every day | ORAL | Status: DC

## 2012-04-20 ENCOUNTER — Emergency Department (HOSPITAL_BASED_OUTPATIENT_CLINIC_OR_DEPARTMENT_OTHER)
Admission: EM | Admit: 2012-04-20 | Discharge: 2012-04-20 | Disposition: A | Discharge status: Home / Self Care | Payer: Self-pay | Attending: Emergency Medicine | Admitting: Emergency Medicine

## 2012-04-20 ENCOUNTER — Encounter (HOSPITAL_BASED_OUTPATIENT_CLINIC_OR_DEPARTMENT_OTHER): Payer: Self-pay | Admitting: *Deleted

## 2012-04-20 ENCOUNTER — Emergency Department (HOSPITAL_BASED_OUTPATIENT_CLINIC_OR_DEPARTMENT_OTHER): Payer: Self-pay

## 2012-04-20 DIAGNOSIS — Z21 Asymptomatic human immunodeficiency virus [HIV] infection status: Secondary | ICD-10-CM | POA: Insufficient documentation

## 2012-04-20 DIAGNOSIS — R6884 Jaw pain: Secondary | ICD-10-CM | POA: Insufficient documentation

## 2012-04-20 DIAGNOSIS — K089 Disorder of teeth and supporting structures, unspecified: Secondary | ICD-10-CM | POA: Insufficient documentation

## 2012-04-20 DIAGNOSIS — Z79899 Other long term (current) drug therapy: Secondary | ICD-10-CM | POA: Insufficient documentation

## 2012-04-20 DIAGNOSIS — IMO0001 Reserved for inherently not codable concepts without codable children: Secondary | ICD-10-CM | POA: Insufficient documentation

## 2012-04-20 DIAGNOSIS — R6889 Other general symptoms and signs: Secondary | ICD-10-CM

## 2012-04-20 DIAGNOSIS — R509 Fever, unspecified: Secondary | ICD-10-CM | POA: Insufficient documentation

## 2012-04-20 DIAGNOSIS — R1110 Vomiting, unspecified: Secondary | ICD-10-CM | POA: Insufficient documentation

## 2012-04-20 DIAGNOSIS — R05 Cough: Secondary | ICD-10-CM | POA: Insufficient documentation

## 2012-04-20 DIAGNOSIS — J3489 Other specified disorders of nose and nasal sinuses: Secondary | ICD-10-CM | POA: Insufficient documentation

## 2012-04-20 DIAGNOSIS — F17200 Nicotine dependence, unspecified, uncomplicated: Secondary | ICD-10-CM | POA: Insufficient documentation

## 2012-04-20 DIAGNOSIS — R059 Cough, unspecified: Secondary | ICD-10-CM | POA: Insufficient documentation

## 2012-04-20 HISTORY — DX: Human immunodeficiency virus (HIV) disease: B20

## 2012-04-20 HISTORY — DX: Asymptomatic human immunodeficiency virus (hiv) infection status: Z21

## 2012-04-20 MED ORDER — MUCINEX DM 30-600 MG PO TB12: 1.0000 | ORAL_TABLET | Freq: Two times a day (BID) | ORAL | Status: DC

## 2012-04-20 MED ORDER — NAPROXEN 500 MG PO TABS: 500.0000 mg | ORAL_TABLET | Freq: Two times a day (BID) | ORAL | Status: DC

## 2012-04-20 MED ORDER — AZITHROMYCIN 250 MG PO TABS: 250.0000 mg | ORAL_TABLET | Freq: Every day | ORAL | Status: DC

## 2012-04-20 NOTE — ED Notes (Signed)
Flu like symptoms, body aches, chills, sweating, fever, sinus congestion for the last 5 days.

## 2012-04-20 NOTE — Discharge Instructions (Signed)
Take the antibiotic until completed in case there is a tooth problem as the cause of the jaw pain. Also take the Naprosyn anti-inflammatory for pain and for the bodyaches. Also recommend Mucinex DM for the cough and congestion. Return for any newer worse symptoms.

## 2012-04-20 NOTE — ED Provider Notes (Signed)
History     CSN: [id number]  Arrival date & time 04/20/12  1015   First MD Initiated Contact with Patient 04/20/12 1041      Chief Complaint  Patient presents with  . Influenza    (Consider location/radiation/quality/duration/timing/severity/associated sxs/prior treatment) Patient is a 37 y.o. male presenting with flu symptoms. The history is provided by the patient.  Influenza Presenting symptoms: cough, fever, myalgias and vomiting   Presenting symptoms: no diarrhea, no headaches, no shortness of breath and no sore throat   Associated symptoms: chills and nasal congestion   Associated symptoms: no ear pain    patient with flulike symptoms. Been ongoing for 5 days. Also with complaint of pain on the right upper tooth which could be related to tooth infection. The tooth pain is rated 5/10. Patient has a productive cough.  Past Medical History  Diagnosis Date  . HIV (human immunodeficiency virus infection)     History reviewed. No pertinent past surgical history.  No family history on file.  History  Substance Use Topics  . Smoking status: Current Every Day Smoker -- 0.30 packs/day    Types: Cigarettes  . Smokeless tobacco: Not on file  . Alcohol Use: No      Review of Systems  Constitutional: Positive for fever, chills and diaphoresis.  HENT: Positive for congestion and dental problem. Negative for ear pain and sore throat.   Eyes: Negative for pain and redness.  Respiratory: Positive for cough. Negative for shortness of breath.   Cardiovascular: Negative for chest pain.  Gastrointestinal: Positive for vomiting. Negative for abdominal pain and diarrhea.  Genitourinary: Negative for dysuria.  Musculoskeletal: Positive for myalgias.  Skin: Negative for rash.  Neurological: Negative for headaches.  Hematological: Does not bruise/bleed easily.    Allergies  Penicillins  Home Medications   Current Outpatient Rx  Name  Route  Sig  Dispense  Refill  .  azithromycin (ZITHROMAX) 250 MG tablet   Oral   Take 1 tablet (250 mg total) by mouth daily. Take first 2 tablets together, then 1 every day until finished.   6 tablet   0   . Dextromethorphan-Guaifenesin (MUCINEX DM) 30-600 MG TB12   Oral   Take 1 tablet by mouth every 12 (twelve) hours.   14 each   1   . Emtricitab-Rilpivir-Tenofovir 200-25-300 MG TABS   Oral   Take 1 tablet by mouth daily.   30 tablet   5   . ENSURE (ENSURE)   Oral   Take 1 Can by mouth 3 (three) times daily between meals.   237 mL   11   . megestrol (MEGACE ES) 625 MG/5ML suspension   Oral   Take 5 mLs (625 mg total) by mouth daily.   150 mL   3     rec'd 1 bottle of megace es in the mail. Called &  ...   . naproxen (NAPROSYN) 500 MG tablet   Oral   Take 1 tablet (500 mg total) by mouth 2 (two) times daily.   14 tablet   0     BP 144/86  Pulse 100  Temp(Src) 99.1 F (37.3 C) (Oral)  Resp 18  Ht 5\' 2"  (1.575 m)  Wt 130 lb (58.968 kg)  BMI 23.77 kg/m2  SpO2 100%  Physical Exam  Nursing note and vitals reviewed. Constitutional: He is oriented to person, place, and time. He appears well-developed and well-nourished. No distress.  HENT:  Head: Normocephalic and atraumatic.  Mouth/Throat: Oropharynx  is clear and moist. No oropharyngeal exudate.  Right upper of molar with significant decay no gum swelling.  Eyes: Conjunctivae and EOM are normal. Pupils are equal, round, and reactive to light.  Neck: Normal range of motion. Neck supple.  Cardiovascular: Normal rate, regular rhythm and normal heart sounds.   No murmur heard. Pulmonary/Chest: Effort normal and breath sounds normal. No respiratory distress. He has no wheezes. He has no rales.  Abdominal: Soft. Bowel sounds are normal. There is no tenderness.  Musculoskeletal: Normal range of motion. He exhibits no edema.  Lymphadenopathy:    He has no cervical adenopathy.  Neurological: He is alert and oriented to person, place, and time. No  cranial nerve deficit. He exhibits normal muscle tone. Coordination normal.  Skin: Skin is warm. No rash noted.    ED Course  Procedures (including critical care time)  Labs Reviewed - No data to display Dg Chest 2 View  04/20/2012  *RADIOLOGY REPORT*  Clinical Data: Cough and congestion.  CHEST - 2 VIEW  Comparison: None  Findings: The cardiac silhouette, mediastinal and hilar contours are normal.  The lungs are clear.  No pleural effusion.  The bony thorax is intact.  IMPRESSION: No acute cardiopulmonary findings.   Original Report Authenticated By: Rudie Meyer, M.D.      1. Flu-like symptoms   2. Jaw pain       MDM    Chest x-ray negative for pneumonia. Patient has flulike symptoms. Also may have inflammation of one of his teeth so we'll treat the bodyaches and tie inflammatories and Mucinex DM. Patient nontoxic no acute distress.        Shelda Jakes, MD 04/20/12 1200

## 2012-04-22 ENCOUNTER — Emergency Department (INDEPENDENT_AMBULATORY_CARE_PROVIDER_SITE_OTHER)
Admission: EM | Admit: 2012-04-22 | Discharge: 2012-04-22 | Disposition: A | Discharge status: Home / Self Care | Payer: Medicare Other | Source: Home / Self Care | Attending: Family Medicine | Admitting: Family Medicine

## 2012-04-22 ENCOUNTER — Encounter: Payer: Self-pay | Admitting: *Deleted

## 2012-04-22 ENCOUNTER — Telehealth: Payer: Self-pay | Admitting: *Deleted

## 2012-04-22 DIAGNOSIS — H6120 Impacted cerumen, unspecified ear: Secondary | ICD-10-CM

## 2012-04-22 DIAGNOSIS — H6123 Impacted cerumen, bilateral: Secondary | ICD-10-CM

## 2012-04-22 DIAGNOSIS — J111 Influenza due to unidentified influenza virus with other respiratory manifestations: Secondary | ICD-10-CM

## 2012-04-22 HISTORY — DX: Hyperlipidemia, unspecified: E78.5

## 2012-04-22 MED ORDER — OSELTAMIVIR PHOSPHATE 75 MG PO CAPS: 75.0000 mg | ORAL_CAPSULE | Freq: Two times a day (BID) | ORAL | Status: DC

## 2012-04-22 NOTE — Telephone Encounter (Signed)
Patient called requesting follow up from urgent care visit.  Still having congestion, feels his BP is elevated because his headaches are back.  Reports acute tooth pain - notified dental clinic.  Appointment given for 04/26/12 at 11:00. Andree Coss, RN

## 2012-04-22 NOTE — ED Provider Notes (Signed)
History     CSN: [id number]  Arrival date & time 04/22/12  1047   First MD Initiated Contact with Patient 04/22/12 1113      Chief Complaint  Patient presents with  . Influenza       HPI Comments: Patient developed a mild cough yesterday.  This morning he was awakened at 5:30am by chills, myalgias, and headache.  No pleuritic pain or shortness of breath.   He has not had influenza immunization for this season.   He also complains of bilateral ear wax, but no earache  The history is provided by the patient.    Past Medical History  Diagnosis Date  . Diabetes mellitus   . Hypertension   . Hyperlipidemia     History reviewed. No pertinent past surgical history.  Family History  Problem Relation Age of Onset  . Cancer Mother     breast    History  Substance Use Topics  . Smoking status: Never Smoker   . Smokeless tobacco: Never Used  . Alcohol Use: No      Review of Systems + sore throat + cough No pleuritic pain No wheezing + nasal congestion No post-nasal drainage No sinus pain/pressure No itchy/red eyes No earache No hemoptysis No SOB + fever, + chills No nausea No vomiting No abdominal pain No diarrhea No urinary symptoms No skin rashes + fatigue + myalgias + headache Used OTC meds without relief  Allergies  Review of patient's allergies indicates no known allergies.  Home Medications   Current Outpatient Rx  Name  Route  Sig  Dispense  Refill  . pravastatin (PRAVACHOL) 40 MG tablet   Oral   Take 40 mg by mouth daily.         . benazepril (LOTENSIN) 20 MG tablet   Oral   Take 20 mg by mouth daily.         Marland Kitchen ibuprofen (ADVIL,MOTRIN) 800 MG tablet   Oral   Take 1 tablet (800 mg total) by mouth 3 (three) times daily.   21 tablet   0   . metFORMIN (GLUCOPHAGE) 500 MG tablet   Oral   Take 500 mg by mouth 2 (two) times daily with a meal.         . oseltamivir (TAMIFLU) 75 MG capsule   Oral   Take 1 capsule (75 mg total) by  mouth every 12 (twelve) hours.   10 capsule   0   . pantoprazole (PROTONIX) 40 MG tablet   Oral   Take 1 tablet (40 mg total) by mouth daily.   14 tablet   0   . polyethylene glycol (MIRALAX) packet   Oral   Take 17 g by mouth daily. As needed until movement occurs   10 each   0     BP 133/89  Pulse 107  Temp(Src) 102.3 F (39.1 C) (Oral)  Ht 5\' 7"  (1.702 m)  Wt 289 lb (131.09 kg)  BMI 45.25 kg/m2  SpO2 96%  Physical Exam Nursing notes and Vital Signs reviewed. Appearance:  Patient appears stated age, and in no acute distress.  Patient is obese (BMI 45.3) Eyes:  Pupils are equal, round, and reactive to light and accomodation.  Extraocular movement is intact.  Conjunctivae are not inflamed  Ears:  Canals partly occluded with cerumen.  Unable to fully visualize right tympanic membrane; left tympanic membrane appears normal. Nose:  Mildly congested turbinates.  No sinus tenderness.  Pharynx:  Normal Neck:  Supple.  Slightly tender shotty posterior nodes are palpated bilaterally  Lungs:  Clear to auscultation.  Breath sounds are equal.  Heart:  Regular rate and rhythm without rubs, or gallops.  There is a grade 3/6 SEM left sternal border. Abdomen:  Nontender without masses or hepatosplenomegaly.  Bowel sounds are present.  No CVA or flank tenderness.  Extremities:  No edema.  No calf tenderness Skin:  No rash present.   ED Course  Procedures  Ear lavage bilateral:  Post lavage, canals appear normal.  Both tympanic membrane have slight erythema superiorly.      1. Influenza-like illness   2. Cerumen impaction, bilateral       MDM  Begin Tamiflu. Take plain Mucinex (guaifenesin) twice daily for cough and congestion.  Increase fluid intake, rest. May use Afrin nasal spray (or generic oxymetazoline) twice daily for about 5 days.  Also recommend using saline nasal spray several times daily and saline nasal irrigation (AYR is a common brand) Stop all antihistamines for  now, and other non-prescription cough/cold preparations. May take Ibuprofen 200mg , 4 tabs every 8 hours with food for headache, body aches, fever, etc. May take Delsym Cough Suppressant at bedtime for nighttime cough.  Follow-up with family doctor if not improving about 5 to 7 days        Lattie Haw, MD 04/22/12 1150

## 2012-04-22 NOTE — ED Notes (Signed)
[place] c/o HA, body aches, fever and dry cough x this AM. He did not receive a flu vaccine this year.

## 2012-04-26 ENCOUNTER — Encounter: Payer: Self-pay | Admitting: Infectious Disease

## 2012-04-26 ENCOUNTER — Ambulatory Visit (INDEPENDENT_AMBULATORY_CARE_PROVIDER_SITE_OTHER): Payer: Self-pay | Admitting: Infectious Disease

## 2012-04-26 VITALS — BP 124/91 | HR 89 | Temp 98.2°F | Ht 62.0 in | Wt 132.8 lb

## 2012-04-26 DIAGNOSIS — B20 Human immunodeficiency virus [HIV] disease: Secondary | ICD-10-CM

## 2012-04-26 DIAGNOSIS — J329 Chronic sinusitis, unspecified: Secondary | ICD-10-CM

## 2012-04-26 DIAGNOSIS — Z23 Encounter for immunization: Secondary | ICD-10-CM

## 2012-04-26 LAB — CBC WITH DIFFERENTIAL/PLATELET
Basophils Absolute: 0 10*3/uL (ref 0.0–0.1)
Basophils Relative: 0 % (ref 0–1)
Eosinophils Absolute: 0.1 10*3/uL (ref 0.0–0.7)
Eosinophils Relative: 1 % (ref 0–5)
HCT: 39.4 % (ref 39.0–52.0)
Lymphocytes Relative: 46 % (ref 12–46)
MCH: 27.6 pg (ref 26.0–34.0)
MCHC: 33.5 g/dL (ref 30.0–36.0)
MCV: 82.3 fL (ref 78.0–100.0)
Monocytes Absolute: 0.4 10*3/uL (ref 0.1–1.0)
Platelets: 338 10*3/uL (ref 150–400)
RDW: 15.9 % — ABNORMAL HIGH (ref 11.5–15.5)

## 2012-04-26 LAB — COMPLETE METABOLIC PANEL WITH GFR
ALT: 18 U/L (ref 0–53)
AST: 18 U/L (ref 0–37)
Albumin: 4 g/dL (ref 3.5–5.2)
Alkaline Phosphatase: 50 U/L (ref 39–117)
BUN: 15 mg/dL (ref 6–23)
Calcium: 9.9 mg/dL (ref 8.4–10.5)
Chloride: 104 mEq/L (ref 96–112)
Potassium: 4.4 mEq/L (ref 3.5–5.3)
Sodium: 137 mEq/L (ref 135–145)
Total Protein: 7.5 g/dL (ref 6.0–8.3)

## 2012-04-26 MED ORDER — EMTRICITAB-RILPIVIR-TENOFOV DF 200-25-300 MG PO TABS: 1.0000 | ORAL_TABLET | Freq: Every day | ORAL | Status: DC

## 2012-04-26 MED ORDER — FLUTICASONE PROPIONATE 50 MCG/ACT NA SUSP: 2.0000 | Freq: Every day | NASAL | Status: DC

## 2012-04-26 NOTE — Patient Instructions (Addendum)
Buy OTC "Afrin" and spray two-three sprays into both nostrils at night  After applying afrin, take two doses of the "flonase" steroid spray  Call us if that does not work

## 2012-04-26 NOTE — Progress Notes (Signed)
Subjective:    Patient ID: Rodney Chase, male    DOB: 07-12-1975, 37 y.o.   MRN: [id number]  Headache  Associated symptoms include rhinorrhea and sinus pressure. Pertinent negatives include no abdominal pain, back pain, coughing, dizziness, fever, nausea, photophobia, sore throat, vomiting or weakness.    37 year old [place] American man with HIV that he recently started complera. He states he's been taking his medication reliably for the last 2 months. Do not have repeat viral load or CD4 count since his last checked in August and we'll check those labs today. He has been taking the with full meals every day and we emphasized the need continue to do so and avoid antacids.  He has had problems with sinus congestion and headaches and ataxia been seen in emergent [facility] given an azithromycin course. Sinus congestion is slightly improved though does persist. I've counseled him to try intranasal Afrin for a few days and also given a prescription for fluticasone as he seems to have chronic allergic symptoms.  Review of Systems  Constitutional: Negative for fever, chills, diaphoresis, activity change, appetite change, fatigue and unexpected weight change.  HENT: Positive for congestion, rhinorrhea, postnasal drip and sinus pressure. Negative for sore throat, sneezing and trouble swallowing.   Eyes: Negative for photophobia and visual disturbance.  Respiratory: Negative for cough, chest tightness, shortness of breath, wheezing and stridor.   Cardiovascular: Negative for chest pain, palpitations and leg swelling.  Gastrointestinal: Negative for nausea, vomiting, abdominal pain, diarrhea, constipation, blood in stool, abdominal distention and anal bleeding.  Genitourinary: Negative for dysuria, hematuria, flank pain and difficulty urinating.  Musculoskeletal: Negative for myalgias, back pain, joint swelling, arthralgias and gait problem.  Skin: Negative for color change, pallor, rash and wound.    Neurological: Positive for headaches. Negative for dizziness, tremors, weakness and light-headedness.  Hematological: Negative for adenopathy. Does not bruise/bleed easily.  Psychiatric/Behavioral: Negative for behavioral problems, confusion, sleep disturbance, dysphoric mood, decreased concentration and agitation.       Objective:   Physical Exam  Constitutional: He is oriented to person, place, and time. He appears well-developed and well-nourished. No distress.  HENT:  Head: Normocephalic and atraumatic.    Mouth/Throat: Oropharynx is clear and moist. No oropharyngeal exudate.  Eyes: Conjunctivae and EOM are normal. Pupils are equal, round, and reactive to light. No scleral icterus.  Neck: Normal range of motion. Neck supple. No JVD present.  Cardiovascular: Normal rate, regular rhythm and normal heart sounds.  Exam reveals no gallop and no friction rub.   No murmur heard. Pulmonary/Chest: Effort normal and breath sounds normal. No respiratory distress. He has no wheezes. He has no rales. He exhibits no tenderness.  Abdominal: He exhibits no distension and no mass. There is no tenderness. There is no rebound and no guarding.  Musculoskeletal: He exhibits no edema and no tenderness.  Lymphadenopathy:    He has no cervical adenopathy.  Neurological: He is alert and oriented to person, place, and time. He has normal reflexes. He exhibits normal muscle tone. Coordination normal.  Skin: Skin is warm and dry. He is not diaphoretic. No erythema. No pallor.  Psychiatric: He has a normal mood and affect. His behavior is normal. Judgment and thought content normal.          Assessment & Plan:  HIV: continue complera with FOOD, check labs today and he will have FU appt with Dr. Ninetta Lights later this month  Sinusitis: try afrin and flonase given some of his chronic complaints  HTN: systolic  well controlled today. Will defer further tweaking to future appts. He would benefit long term from  IM PCP

## 2012-04-27 LAB — HIV-1 RNA ULTRAQUANT REFLEX TO GENTYP+: HIV 1 RNA Quant: <20 copies/mL (ref <20)

## 2012-05-10 ENCOUNTER — Other Ambulatory Visit: Payer: Self-pay

## 2012-05-13 ENCOUNTER — Telehealth: Payer: Self-pay | Admitting: *Deleted

## 2012-05-13 NOTE — Telephone Encounter (Signed)
Pt needing Complera refill today.  Pt advised to come to [facility] on [address] to pick up rx not use [facility]  Pt also needs to reapply for ADAP, Rodney Chase and [facility].  Transferred call to B. Jeraldine Loots to make appt.

## 2012-05-17 ENCOUNTER — Telehealth: Payer: Self-pay

## 2012-05-17 ENCOUNTER — Other Ambulatory Visit: Payer: Self-pay

## 2012-05-17 DIAGNOSIS — B20 Human immunodeficiency virus [HIV] disease: Secondary | ICD-10-CM

## 2012-05-17 MED ORDER — EMTRICITAB-RILPIVIR-TENOFOV DF 200-25-300 MG PO TABS: 1.0000 | ORAL_TABLET | Freq: Every day | ORAL | Status: DC

## 2012-05-17 NOTE — Telephone Encounter (Signed)
Patient thought he was able to pick ADAP medications up at any [facility] pharmacy. .  Pt advised medications can be sent to [facility]  In [place] and mailed to home address.  He is in agreement with refilling with this pharmacy.   Medications sent electronically .   He can only pick up at [facility].   Laurell Josephs, RN

## 2012-05-24 ENCOUNTER — Ambulatory Visit: Payer: Self-pay | Admitting: Infectious Diseases

## 2012-05-24 ENCOUNTER — Telehealth: Payer: Self-pay | Admitting: *Deleted

## 2012-05-24 ENCOUNTER — Ambulatory Visit: Payer: Self-pay

## 2012-05-24 NOTE — Telephone Encounter (Signed)
Called patient and explained that because he had 4 no shows in the past year he will have to come to our walk in clinic. Told him it was first come first serve basis and that there was a clinic this Wednesday between 3 and 4 pm. Rodney Chase

## 2012-05-28 ENCOUNTER — Ambulatory Visit: Payer: Self-pay

## 2012-06-01 ENCOUNTER — Encounter: Payer: Self-pay | Admitting: *Deleted

## 2012-06-02 ENCOUNTER — Encounter: Payer: Self-pay | Admitting: Infectious Diseases

## 2012-06-02 ENCOUNTER — Ambulatory Visit (INDEPENDENT_AMBULATORY_CARE_PROVIDER_SITE_OTHER): Payer: No Typology Code available for payment source | Admitting: Infectious Diseases

## 2012-06-02 ENCOUNTER — Ambulatory Visit: Payer: Self-pay

## 2012-06-02 VITALS — BP 138/85 | HR 90 | Temp 98.6°F | Ht 63.0 in | Wt 133.0 lb

## 2012-06-02 DIAGNOSIS — B20 Human immunodeficiency virus [HIV] disease: Secondary | ICD-10-CM

## 2012-06-02 DIAGNOSIS — Z113 Encounter for screening for infections with a predominantly sexual mode of transmission: Secondary | ICD-10-CM

## 2012-06-02 DIAGNOSIS — Z79899 Other long term (current) drug therapy: Secondary | ICD-10-CM

## 2012-06-02 NOTE — Assessment & Plan Note (Signed)
He is doing well. He is given condoms. His vax are up to date. He is referred to dental. He will rtc in 6 months. Needs repeat anal pap (inadequate). Need to check Hep A and B for response to partial vaccine series.

## 2012-06-02 NOTE — Progress Notes (Signed)
  Subjective:    Patient ID: Rodney Chase, male    DOB: 1975-12-29, 37 y.o.   MRN: [id number]  HPI 37 yo M with hx of anxiety, HIV+ (dx 8-07), started on complera 01-2013. Had Naive genotype 08-30-08. Seen in for sinusitis February 2014 in ID clinic.  No problems with complera. Complains of tightness in his R calf occasionally. Has occas chills in his arm. No LAN. Wt steady. Eating well.   HIV 1 RNA Quant (copies/mL)  Date Value  04/26/2012 <20   10/14/2011 27472*  05/07/2011 35217*     CD4 T Cell Abs (cmm)  Date Value  04/26/2012 660   10/14/2011 480   05/07/2011 530    Sinuses have improved. Was not able to get rx as was too expensive.    Review of Systems  Constitutional: Negative for appetite change and unexpected weight change.  Gastrointestinal: Negative for diarrhea and constipation.  Genitourinary: Negative for dysuria.       Objective:   Physical Exam  Constitutional: He appears well-developed and well-nourished.  HENT:  Mouth/Throat: Dental caries present. No oropharyngeal exudate.  Eyes: EOM are normal. Pupils are equal, round, and reactive to light.  Neck: Neck supple.  Cardiovascular: Normal rate, regular rhythm and normal heart sounds.   Pulmonary/Chest: Effort normal and breath sounds normal.  Abdominal: Soft. Bowel sounds are normal. He exhibits no distension. There is no tenderness.  Lymphadenopathy:    He has no cervical adenopathy.          Assessment & Plan:

## 2012-09-06 ENCOUNTER — Encounter: Payer: Self-pay | Admitting: Infectious Diseases

## 2012-09-06 ENCOUNTER — Ambulatory Visit (INDEPENDENT_AMBULATORY_CARE_PROVIDER_SITE_OTHER): Payer: No Typology Code available for payment source | Admitting: Infectious Diseases

## 2012-09-06 VITALS — BP 126/86 | HR 78 | Temp 98.1°F | Ht 64.0 in | Wt 130.0 lb

## 2012-09-06 DIAGNOSIS — Z79899 Other long term (current) drug therapy: Secondary | ICD-10-CM

## 2012-09-06 DIAGNOSIS — F411 Generalized anxiety disorder: Secondary | ICD-10-CM

## 2012-09-06 DIAGNOSIS — Z113 Encounter for screening for infections with a predominantly sexual mode of transmission: Secondary | ICD-10-CM

## 2012-09-06 DIAGNOSIS — F17200 Nicotine dependence, unspecified, uncomplicated: Secondary | ICD-10-CM

## 2012-09-06 DIAGNOSIS — B20 Human immunodeficiency virus [HIV] disease: Secondary | ICD-10-CM

## 2012-09-06 LAB — COMPREHENSIVE METABOLIC PANEL
AST: 15 U/L (ref 0–37)
BUN: 11 mg/dL (ref 6–23)
Calcium: 9.5 mg/dL (ref 8.4–10.5)
Chloride: 106 mEq/L (ref 96–112)
Creat: 0.9 mg/dL (ref 0.50–1.35)
Total Bilirubin: 0.3 mg/dL (ref 0.3–1.2)

## 2012-09-06 LAB — CBC
HCT: 40.3 % (ref 39.0–52.0)
MCH: 27.6 pg (ref 26.0–34.0)
MCV: 82.4 fL (ref 78.0–100.0)
RDW: 15.3 % (ref 11.5–15.5)
WBC: 5.2 10*3/uL (ref 4.0–10.5)

## 2012-09-06 LAB — LIPID PANEL: LDL Cholesterol: 75 mg/dL (ref 0–99)

## 2012-09-06 LAB — RPR

## 2012-09-06 MED ORDER — SERTRALINE HCL 25 MG PO TABS: 25.0000 mg | ORAL_TABLET | Freq: Every day | ORAL | Status: DC

## 2012-09-06 NOTE — Progress Notes (Signed)
  Subjective:    Patient ID: Rodney Chase, male    DOB: [birth year], 37 y.o.   MRN: [id number]  HPI 37 yo M with hx of anxiety, HIV+ (dx 8-07), started on complera 01-2013. Had Naive genotype 08-30-08.  HIV 1 RNA Quant (copies/mL)  Date Value  04/26/2012 <20   10/14/2011 27472*  05/07/2011 35217*     CD4 T Cell Abs (cmm)  Date Value  04/26/2012 660   10/14/2011 480   05/07/2011 530    Has occas depression. Would like to get a dog to help him with this. Thinks he may have panic attacks. No prev anti-depression rx.   Review of Systems  Constitutional: Negative for appetite change and unexpected weight change.  Psychiatric/Behavioral: Positive for sleep disturbance. The patient is nervous/anxious.        Objective:   Physical Exam  Constitutional: He appears well-developed and well-nourished.  HENT:  Mouth/Throat: No oropharyngeal exudate.  Eyes: EOM are normal. Pupils are equal, round, and reactive to light.  Neck: Neck supple.  Cardiovascular: Normal rate, regular rhythm and normal heart sounds.   Pulmonary/Chest: Effort normal and breath sounds normal.  Abdominal: Soft. Bowel sounds are normal. He exhibits no distension.  Lymphadenopathy:    He has no cervical adenopathy.          Assessment & Plan:

## 2012-09-06 NOTE — Assessment & Plan Note (Signed)
Encouraged to quit. 

## 2012-09-06 NOTE — Addendum Note (Signed)
Addended bySteva Colder on: 09/06/2012 12:01 PM   Modules accepted: Orders

## 2012-09-06 NOTE — Assessment & Plan Note (Signed)
He is written a note to be able to keep his dog in his appt for stress relief. Will give him trial of zoloft. Will have him see kenny.

## 2012-09-06 NOTE — Assessment & Plan Note (Signed)
He is doing well. Will recheck his labs today. He is given condoms. Will check on his dental application. Will see him back in 6 months.

## 2012-09-07 LAB — HIV-1 RNA QUANT-NO REFLEX-BLD
HIV 1 RNA Quant: 347 copies/mL — ABNORMAL HIGH (ref <20)
HIV-1 RNA Quant, Log: 2.54 {Log} — ABNORMAL HIGH (ref <1.30)

## 2012-09-07 LAB — T-HELPER CELL (CD4) - (RCID CLINIC ONLY): CD4 T Cell Abs: 690 uL (ref 400–2700)

## 2012-09-07 LAB — HEPATITIS A ANTIBODY, TOTAL: Hep A Total Ab: NEGATIVE

## 2012-09-18 ENCOUNTER — Emergency Department (HOSPITAL_BASED_OUTPATIENT_CLINIC_OR_DEPARTMENT_OTHER)
Admission: EM | Admit: 2012-09-18 | Discharge: 2012-09-18 | Disposition: A | Discharge status: Home / Self Care | Payer: Self-pay | Attending: Emergency Medicine | Admitting: Emergency Medicine

## 2012-09-18 ENCOUNTER — Encounter (HOSPITAL_BASED_OUTPATIENT_CLINIC_OR_DEPARTMENT_OTHER): Payer: Self-pay | Admitting: *Deleted

## 2012-09-18 ENCOUNTER — Emergency Department (HOSPITAL_BASED_OUTPATIENT_CLINIC_OR_DEPARTMENT_OTHER): Payer: Self-pay

## 2012-09-18 DIAGNOSIS — H9209 Otalgia, unspecified ear: Secondary | ICD-10-CM | POA: Insufficient documentation

## 2012-09-18 DIAGNOSIS — Z880 Allergy status to penicillin: Secondary | ICD-10-CM | POA: Insufficient documentation

## 2012-09-18 DIAGNOSIS — R6889 Other general symptoms and signs: Secondary | ICD-10-CM | POA: Insufficient documentation

## 2012-09-18 DIAGNOSIS — IMO0001 Reserved for inherently not codable concepts without codable children: Secondary | ICD-10-CM | POA: Insufficient documentation

## 2012-09-18 DIAGNOSIS — R5383 Other fatigue: Secondary | ICD-10-CM | POA: Insufficient documentation

## 2012-09-18 DIAGNOSIS — Z21 Asymptomatic human immunodeficiency virus [HIV] infection status: Secondary | ICD-10-CM | POA: Insufficient documentation

## 2012-09-18 DIAGNOSIS — J3489 Other specified disorders of nose and nasal sinuses: Secondary | ICD-10-CM | POA: Insufficient documentation

## 2012-09-18 DIAGNOSIS — Z79899 Other long term (current) drug therapy: Secondary | ICD-10-CM | POA: Insufficient documentation

## 2012-09-18 DIAGNOSIS — J029 Acute pharyngitis, unspecified: Secondary | ICD-10-CM | POA: Insufficient documentation

## 2012-09-18 DIAGNOSIS — F17200 Nicotine dependence, unspecified, uncomplicated: Secondary | ICD-10-CM | POA: Insufficient documentation

## 2012-09-18 DIAGNOSIS — R5381 Other malaise: Secondary | ICD-10-CM | POA: Insufficient documentation

## 2012-09-18 DIAGNOSIS — R112 Nausea with vomiting, unspecified: Secondary | ICD-10-CM | POA: Insufficient documentation

## 2012-09-18 DIAGNOSIS — J069 Acute upper respiratory infection, unspecified: Secondary | ICD-10-CM | POA: Insufficient documentation

## 2012-09-18 DIAGNOSIS — R509 Fever, unspecified: Secondary | ICD-10-CM | POA: Insufficient documentation

## 2012-09-18 DIAGNOSIS — R599 Enlarged lymph nodes, unspecified: Secondary | ICD-10-CM | POA: Insufficient documentation

## 2012-09-18 LAB — BASIC METABOLIC PANEL WITH GFR
BUN: 13 mg/dL (ref 6–23)
CO2: 25 meq/L (ref 19–32)
Calcium: 10 mg/dL (ref 8.4–10.5)
Chloride: 104 meq/L (ref 96–112)
Creatinine, Ser: 0.9 mg/dL (ref 0.50–1.35)
GFR calc Af Amer: >90 mL/min
GFR calc non Af Amer: >90 mL/min
Glucose, Bld: 101 mg/dL — ABNORMAL HIGH (ref 70–99)
Potassium: 4 meq/L (ref 3.5–5.1)
Sodium: 138 meq/L (ref 135–145)

## 2012-09-18 LAB — CBC WITH DIFFERENTIAL/PLATELET
Basophils Absolute: 0 K/uL (ref 0.0–0.1)
Basophils Relative: 0 % (ref 0–1)
Eosinophils Absolute: 0.1 K/uL (ref 0.0–0.7)
Eosinophils Relative: 1 % (ref 0–5)
HCT: 40.5 % (ref 39.0–52.0)
Hemoglobin: 13.8 g/dL (ref 13.0–17.0)
Lymphocytes Relative: 16 % (ref 12–46)
Lymphs Abs: 1.5 K/uL (ref 0.7–4.0)
MCH: 28.2 pg (ref 26.0–34.0)
MCHC: 34.1 g/dL (ref 30.0–36.0)
MCV: 82.8 fL (ref 78.0–100.0)
Monocytes Absolute: 0.9 K/uL (ref 0.1–1.0)
Monocytes Relative: 10 % (ref 3–12)
Neutro Abs: 6.7 K/uL (ref 1.7–7.7)
Neutrophils Relative %: 73 % (ref 43–77)
Platelets: 211 K/uL (ref 150–400)
RBC: 4.89 MIL/uL (ref 4.22–5.81)
RDW: 14.4 % (ref 11.5–15.5)
WBC: 9.2 K/uL (ref 4.0–10.5)

## 2012-09-18 LAB — RAPID STREP SCREEN (MED CTR MEBANE ONLY): Streptococcus, Group A Screen (Direct): NEGATIVE

## 2012-09-18 NOTE — ED Notes (Signed)
PA at bedside.

## 2012-09-18 NOTE — ED Notes (Signed)
Achy for the past few days. Productive cough

## 2012-09-18 NOTE — ED Notes (Signed)
Patient transported to X-ray 

## 2012-09-18 NOTE — ED Provider Notes (Signed)
History    CSN: [id number] Arrival date & time 09/18/12  1334  First MD Initiated Contact with Patient 09/18/12 1349     Chief Complaint  Patient presents with  . Cough   (Consider location/radiation/quality/duration/timing/severity/associated sxs/prior Treatment) HPI Comments: Patient with history of HIV on HAART therapy, last CD4 count performed on 09/06/12 and was 690 -- presents with five-day history of productive cough, nasal congestion, ear pain, sore throat, runny nose, subjective fever and chills. Patient took Mucinex without relief. One episode of vomiting yesterday but no diarrhea. No urinary symptoms. No skin rashes. Onset of symptoms gradual. Course is constant. Nothing makes symptoms better or worse.  Patient is a 37 y.o. male presenting with cough. The history is provided by the patient.  Cough Associated symptoms: chills, ear pain, fever, myalgias, rhinorrhea and sore throat   Associated symptoms: no chest pain, no headaches, no rash and no wheezing    Past Medical History  Diagnosis Date  . HIV (human immunodeficiency virus infection)    History reviewed. No pertinent past surgical history. Family History  Problem Relation Age of Onset  . Diabetes Daughter    History  Substance Use Topics  . Smoking status: Current Every Day Smoker -- 0.30 packs/day    Types: Cigarettes  . Smokeless tobacco: Never Used  . Alcohol Use: No    Review of Systems  Constitutional: Positive for fever, chills and fatigue. Negative for activity change.  HENT: Positive for ear pain, congestion, sore throat, rhinorrhea and sinus pressure. Negative for neck stiffness.   Eyes: Negative for redness.  Respiratory: Positive for cough. Negative for wheezing.   Cardiovascular: Negative for chest pain.  Gastrointestinal: Positive for nausea and vomiting. Negative for abdominal pain and diarrhea.  Genitourinary: Negative for dysuria.  Musculoskeletal: Positive for myalgias.  Skin: Negative  for rash.  Neurological: Negative for headaches.  Hematological: Negative for adenopathy.    Allergies  Penicillins  Home Medications   Current Outpatient Rx  Name  Route  Sig  Dispense  Refill  . Emtricitab-Rilpivir-Tenofovir 200-25-300 MG TABS   Oral   Take 1 tablet by mouth daily.   30 tablet   11   . ENSURE (ENSURE)   Oral   Take 1 Can by mouth 3 (three) times daily between meals.   237 mL   11   . megestrol (MEGACE ES) 625 MG/5ML suspension   Oral   Take 5 mLs (625 mg total) by mouth daily.   150 mL   3     rec'd 1 bottle of megace es in the mail. Called &  ...   . sertraline (ZOLOFT) 25 MG tablet   Oral   Take 1 tablet (25 mg total) by mouth daily.   30 tablet   5    BP 125/83  Pulse 110  Temp(Src) 99.4 F (37.4 C) (Oral)  Resp 20  Ht 5\' 4"  (1.626 m)  Wt 130 lb (58.968 kg)  BMI 22.3 kg/m2  SpO2 99% Physical Exam  Nursing note and vitals reviewed. Constitutional: He appears well-developed and well-nourished.  HENT:  Head: Normocephalic and atraumatic. No trismus in the jaw.  Right Ear: Tympanic membrane, external ear and ear canal normal.  Left Ear: Tympanic membrane, external ear and ear canal normal.  Nose: Mucosal edema present. No rhinorrhea.  Mouth/Throat: Uvula is midline and mucous membranes are normal. Mucous membranes are not dry. No edematous. Posterior oropharyngeal erythema present. No oropharyngeal exudate, posterior oropharyngeal edema or tonsillar abscesses.  Eyes: Conjunctivae are normal. Right eye exhibits no discharge. Left eye exhibits no discharge.  Neck: Normal range of motion. Neck supple.  Cardiovascular: Normal rate, regular rhythm and normal heart sounds.   Pulmonary/Chest: Effort normal and breath sounds normal. No respiratory distress. He has no wheezes. He has no rales.  Abdominal: Soft. There is no tenderness.  Lymphadenopathy:       Head (right side): No submental, no submandibular, no tonsillar, no preauricular and no  posterior auricular adenopathy present.       Head (left side): No submental, no submandibular, no tonsillar, no preauricular and no posterior auricular adenopathy present.    He has cervical adenopathy.       Right cervical: Superficial cervical adenopathy present.       Left cervical: Superficial cervical adenopathy present.  Neurological: He is alert.  Skin: Skin is warm and dry.  Psychiatric: He has a normal mood and affect.    ED Course  Procedures (including critical care time) Labs Reviewed  BASIC METABOLIC PANEL - Abnormal; Notable for the following:    Glucose, Bld 101 (*)    All other components within normal limits  RAPID STREP SCREEN  CBC WITH DIFFERENTIAL   Dg Chest 2 View  09/18/2012   *RADIOLOGY REPORT*  Clinical Data: Productive cough, body aches  CHEST - 2 VIEW  Comparison: 04/20/2012  Findings: Lungs are clear. No pleural effusion or pneumothorax.  Cardiomediastinal silhouette is within normal limits.  Visualized osseous structures are within normal limits.  IMPRESSION: No evidence of acute cardiopulmonary disease.   Original Report Authenticated By: Charline Bills, M.D.   1. Upper respiratory infection     2:43 PM Patient seen and examined. Work-up initiated. Will check labs, strep, CXR given h/o HIV. Patient appears well, non-toxic.    Vital signs reviewed and are as follows: Filed Vitals:   09/18/12 1344  BP: 125/83  Pulse: 110  Temp: 99.4 F (37.4 C)  Resp: 20   CXR reviewed by myself.   4:26 PM workup is reassuring. Patient informed.  Patient counseled on supportive care for viral URI and s/s to return including worsening symptoms, persistent fever, persistent vomiting, or if they have any other concerns.  Urged to see PCP if symptoms persist for more than 3 days. Patient verbalizes understanding and agrees with plan.    MDM  Patient with HIV, normal CD4 -- symptoms consistent with viral upper respiratory tract infection. White blood cell count is  normal. No strep throat. Chest x-ray is clear. Feel this likely represents a viral infection. Patient appears well, nontoxic. Not dehydrated. Tolerating oral fluids. Patient urged to return with worsening symptoms or other concerns.  Rodney Crigler, PA-C 09/18/12 [phone]

## 2012-09-19 NOTE — ED Provider Notes (Signed)
Medical screening examination/treatment/procedure(s) were performed by non-physician practitioner and as supervising physician I was immediately available for consultation/collaboration.    Maurina Fawaz J. Ksenia Kunz, MD 09/19/12 0701 

## 2012-09-20 LAB — CULTURE, GROUP A STREP

## 2012-09-30 ENCOUNTER — Ambulatory Visit: Payer: No Typology Code available for payment source

## 2012-10-19 ENCOUNTER — Telehealth: Payer: Self-pay | Admitting: *Deleted

## 2012-10-19 NOTE — Telephone Encounter (Signed)
Patient called c/o his left arm and fingers hurting (excrutiating) off and on x 3 weeks. 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 He is on the no show list and was seen once in July. Explained he could not be rescheduled until 6 months without missed appts. Advised him to walk in today or tomorrow morning or afternoon. He said he would try tomorrow morning. Advised if his symptoms worsen with SOB or chest pain to go to the ED ASAP. He agreed with this plan. Wendall Mola

## 2012-11-15 ENCOUNTER — Telehealth: Payer: Self-pay

## 2012-11-15 ENCOUNTER — Encounter: Payer: Self-pay | Admitting: Infectious Disease

## 2012-11-15 ENCOUNTER — Ambulatory Visit (INDEPENDENT_AMBULATORY_CARE_PROVIDER_SITE_OTHER): Payer: Self-pay | Admitting: Infectious Disease

## 2012-11-15 VITALS — BP 139/92 | HR 71 | Temp 98.2°F | Wt 129.0 lb

## 2012-11-15 DIAGNOSIS — N419 Inflammatory disease of prostate, unspecified: Secondary | ICD-10-CM

## 2012-11-15 DIAGNOSIS — B20 Human immunodeficiency virus [HIV] disease: Secondary | ICD-10-CM

## 2012-11-15 DIAGNOSIS — Z23 Encounter for immunization: Secondary | ICD-10-CM

## 2012-11-15 DIAGNOSIS — R21 Rash and other nonspecific skin eruption: Secondary | ICD-10-CM

## 2012-11-15 DIAGNOSIS — R109 Unspecified abdominal pain: Secondary | ICD-10-CM

## 2012-11-15 DIAGNOSIS — Z113 Encounter for screening for infections with a predominantly sexual mode of transmission: Secondary | ICD-10-CM

## 2012-11-15 DIAGNOSIS — A630 Anogenital (venereal) warts: Secondary | ICD-10-CM

## 2012-11-15 LAB — BASIC METABOLIC PANEL WITH GFR
BUN: 14 mg/dL (ref 6–23)
Chloride: 105 mEq/L (ref 96–112)
Creat: 0.91 mg/dL (ref 0.50–1.35)
GFR, Est Non African American: >89 mL/min

## 2012-11-15 MED ORDER — ENSURE PO LIQD: 1.0000 | Freq: Three times a day (TID) | ORAL | Status: DC

## 2012-11-15 MED ORDER — TRIAMCINOLONE ACETONIDE 0.025 % EX OINT: TOPICAL_OINTMENT | Freq: Two times a day (BID) | CUTANEOUS | Status: DC

## 2012-11-15 NOTE — Addendum Note (Signed)
Addended by: Laurell Josephs on: 11/15/2012 02:33 PM   Modules accepted: Orders

## 2012-11-15 NOTE — Telephone Encounter (Signed)
Pt calling for ensure refill. He was advised he will need to discuss this at next visit. He aked when he would be able to be removed from the walk in list and was told after he followed up for two consequent appointments he will be able to call and schedule appointments.   Laurell Josephs, RN

## 2012-11-15 NOTE — Progress Notes (Signed)
Subjective:    Patient ID: Rodney Chase, male    DOB: April 20, 1975, 37 y.o.   MRN: [id number]  Rash Pertinent negatives include no congestion, cough, diarrhea, fatigue, fever, rhinorrhea, shortness of breath, sore throat or vomiting.    37 year old with HIV which has been reasonably controlled after having started Complera though his HIV RNA had been above 300 when checked in July. He states he is only S3 doses of this drug since then.  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   #2 he has had some swelling in his finger bilaterally again which thinks maybe related to his work.  #3 his had some chronic abdominal pain has been worked up at urgent care. He is wondering about possibility of risks that he incurs through having anal receptive intercourse.     Review of Systems  Constitutional: Negative for fever, chills, diaphoresis, activity change, appetite change, fatigue and unexpected weight change.  HENT: Negative for congestion, sore throat, rhinorrhea, sneezing, trouble swallowing and sinus pressure.   Eyes: Negative for photophobia and visual disturbance.  Respiratory: Negative for cough, chest tightness, shortness of breath, wheezing and stridor.   Cardiovascular: Negative for chest pain, palpitations and leg swelling.  Gastrointestinal: Negative for nausea, vomiting, abdominal pain, diarrhea, constipation, blood in stool, abdominal distention and anal bleeding.  Genitourinary: Negative for dysuria, hematuria, flank pain and difficulty urinating.  Musculoskeletal: Positive for myalgias and joint swelling. Negative for back pain, arthralgias and gait problem.  Skin: Positive for rash. Negative for color change, pallor and wound.  Neurological: Negative for dizziness, tremors, weakness and light-headedness.  Hematological: Negative for adenopathy.  Does not bruise/bleed easily.  Psychiatric/Behavioral: Negative for behavioral problems, confusion, sleep disturbance, dysphoric mood, decreased concentration and agitation.       Objective:   Physical Exam  Constitutional: He is oriented to person, place, and time. He appears well-developed and well-nourished. No distress.  HENT:  Head: Normocephalic and atraumatic.  Mouth/Throat: Oropharynx is clear and moist. No oropharyngeal exudate.  Eyes: Conjunctivae and EOM are normal. Pupils are equal, round, and reactive to light. No scleral icterus.  Neck: Normal range of motion. Neck supple. No JVD present.  Cardiovascular: Normal rate, regular rhythm and normal heart sounds.  Exam reveals no gallop and no friction rub.   No murmur heard. Pulmonary/Chest: Effort normal and breath sounds normal. No respiratory distress. He has no wheezes. He has no rales. He exhibits no tenderness.  Abdominal: He exhibits no distension and no mass. There is no tenderness. There is no rebound and no guarding.  Genitourinary: Prostate normal. Rectal exam shows external hemorrhoid, fissure and tenderness. Rectal exam shows no internal hemorrhoid, no mass and anal tone normal.     Musculoskeletal: He exhibits no edema and no tenderness.  Lymphadenopathy:    He has no cervical adenopathy.  Neurological: He is alert and oriented to person, place, and time. He has normal reflexes. He exhibits normal muscle tone. Coordination normal.  Skin: Skin is warm and dry. He is not diaphoretic. No erythema. No pallor.     Psychiatric: He has a normal mood and affect. His behavior is normal. Judgment and thought content normal.          Assessment & Plan:   HIV: Continue Complera but check ultra HIV RNA with reflex to genotype today. I spent greater  than 45 minutes with the patient including greater than 50% of time in face to face counsel of the patient and in coordination of their care.   Rash: not sure what cause  of this is could be some type of allergen. We'll try some topical triamcinolone if he needs this.  Abdominal pain: We'll check routine labs and I'm also checking a post prostate exam urine culture.   STD screening check GC chlamydia check RPR.  Condyloma: will get anal pap next visit (he was in hurry to go to rest room for urine sample)

## 2012-11-17 LAB — HIV-1 RNA ULTRAQUANT REFLEX TO GENTYP+: HIV 1 RNA Quant: 377 copies/mL — ABNORMAL HIGH (ref <20)

## 2012-12-02 ENCOUNTER — Other Ambulatory Visit: Payer: No Typology Code available for payment source

## 2012-12-16 ENCOUNTER — Ambulatory Visit: Payer: Self-pay | Admitting: Infectious Diseases

## 2013-01-10 ENCOUNTER — Other Ambulatory Visit (INDEPENDENT_AMBULATORY_CARE_PROVIDER_SITE_OTHER): Payer: Self-pay

## 2013-01-10 DIAGNOSIS — Z113 Encounter for screening for infections with a predominantly sexual mode of transmission: Secondary | ICD-10-CM

## 2013-01-10 DIAGNOSIS — B20 Human immunodeficiency virus [HIV] disease: Secondary | ICD-10-CM

## 2013-01-10 LAB — COMPREHENSIVE METABOLIC PANEL
AST: 20 U/L (ref 0–37)
Alkaline Phosphatase: 59 U/L (ref 39–117)
BUN: 11 mg/dL (ref 6–23)
CO2: 28 mEq/L (ref 19–32)
Creat: 0.98 mg/dL (ref 0.50–1.35)
Glucose, Bld: 83 mg/dL (ref 70–99)
Total Bilirubin: 0.3 mg/dL (ref 0.3–1.2)
Total Protein: 6.4 g/dL (ref 6.0–8.3)

## 2013-01-10 LAB — CBC
HCT: 41.1 % (ref 39.0–52.0)
MCHC: 34.3 g/dL (ref 30.0–36.0)
MCV: 83.9 fL (ref 78.0–100.0)
RDW: 15.5 % (ref 11.5–15.5)

## 2013-01-11 LAB — HIV-1 RNA QUANT-NO REFLEX-BLD: HIV-1 RNA Quant, Log: 2.93 {Log} — ABNORMAL HIGH (ref <1.30)

## 2013-01-12 ENCOUNTER — Other Ambulatory Visit: Payer: Self-pay | Admitting: *Deleted

## 2013-01-12 DIAGNOSIS — B20 Human immunodeficiency virus [HIV] disease: Secondary | ICD-10-CM

## 2013-01-12 LAB — T-HELPER CELL (CD4) - (RCID CLINIC ONLY): CD4 T Cell Abs: 740 /uL (ref 400–2700)

## 2013-01-12 MED ORDER — EMTRICITAB-RILPIVIR-TENOFOV DF 200-25-300 MG PO TABS: 1.0000 | ORAL_TABLET | Freq: Every day | ORAL | Status: DC

## 2013-01-21 ENCOUNTER — Ambulatory Visit (INDEPENDENT_AMBULATORY_CARE_PROVIDER_SITE_OTHER): Payer: Self-pay | Admitting: Infectious Diseases

## 2013-01-21 VITALS — BP 111/73 | HR 83 | Temp 98.1°F | Ht 63.0 in | Wt 130.5 lb

## 2013-01-21 DIAGNOSIS — R590 Localized enlarged lymph nodes: Secondary | ICD-10-CM | POA: Insufficient documentation

## 2013-01-21 DIAGNOSIS — B20 Human immunodeficiency virus [HIV] disease: Secondary | ICD-10-CM

## 2013-01-21 DIAGNOSIS — M549 Dorsalgia, unspecified: Secondary | ICD-10-CM

## 2013-01-21 DIAGNOSIS — Z113 Encounter for screening for infections with a predominantly sexual mode of transmission: Secondary | ICD-10-CM

## 2013-01-21 DIAGNOSIS — R599 Enlarged lymph nodes, unspecified: Secondary | ICD-10-CM

## 2013-01-21 NOTE — Addendum Note (Signed)
Addended by: Jennet Maduro D on: 01/21/2013 04:55 PM   Modules accepted: Orders

## 2013-01-21 NOTE — Assessment & Plan Note (Addendum)
His HIV RNA continues to rise. Will check genotype, INI, HLA. Will see him back in 6 weeks. He is given condoms. Will check his Hep B SAb.

## 2013-01-21 NOTE — Assessment & Plan Note (Signed)
Will check RPR, GC, chlamydia. ? Uncontrolled virus.

## 2013-01-21 NOTE — Progress Notes (Signed)
  Subjective:    Patient ID: Rodney Chase, male    DOB: [birth year], 37 y.o.   MRN: [id number]  Weakness   37 yo M with hx of anxiety, HIV+ (dx 8-07), started on complera 01-2012. Had Naive genotype 08-30-08. Has been having sharp, electric feeling his L leg. Starts in his back and radiates down. Has hyperesthesia of skin as well. Pain has been up to 9/10.   HIV 1 RNA Quant (copies/mL)  Date Value  01/10/2013 848*  11/15/2012 377*  09/06/2012 347*     CD4 T Cell Abs (/uL)  Date Value  01/10/2013 740   11/15/2012 730   09/06/2012 690      Review of Systems  Constitutional: Negative for appetite change and unexpected weight change.  Gastrointestinal: Negative for diarrhea and constipation.  Genitourinary: Negative for dysuria.  Still having moments when he "blinks out" while drinking ETOH.  No rash on legs.  see HPI.      Objective:   Physical Exam  Constitutional: He appears well-developed and well-nourished.  HENT:  Mouth/Throat: No oropharyngeal exudate.  Eyes: EOM are normal. Pupils are equal, round, and reactive to light.  Neck: Neck supple.  Cardiovascular: Normal rate and regular rhythm.   Pulmonary/Chest: Effort normal and breath sounds normal.  Abdominal: Soft. Bowel sounds are normal. There is no tenderness.  Musculoskeletal: Normal range of motion. He exhibits no tenderness.  Lymphadenopathy:    He has no cervical adenopathy.       Left: Inguinal adenopathy present.  Neurological: No sensory deficit. He exhibits normal muscle tone.  Skin: No rash noted.          Assessment & Plan:

## 2013-01-21 NOTE — Assessment & Plan Note (Signed)
His paresthesias are concerning. Will check MRI (although hesitate at cost...)

## 2013-01-24 ENCOUNTER — Ambulatory Visit: Payer: Self-pay | Admitting: Infectious Diseases

## 2013-02-04 ENCOUNTER — Other Ambulatory Visit: Payer: Self-pay | Admitting: *Deleted

## 2013-02-04 DIAGNOSIS — B20 Human immunodeficiency virus [HIV] disease: Secondary | ICD-10-CM

## 2013-02-04 MED ORDER — EMTRICITAB-RILPIVIR-TENOFOV DF 200-25-300 MG PO TABS: 1.0000 | ORAL_TABLET | Freq: Every day | ORAL | Status: DC

## 2013-02-07 ENCOUNTER — Other Ambulatory Visit (INDEPENDENT_AMBULATORY_CARE_PROVIDER_SITE_OTHER): Payer: Self-pay

## 2013-02-07 ENCOUNTER — Other Ambulatory Visit: Payer: Self-pay | Admitting: Infectious Diseases

## 2013-02-07 DIAGNOSIS — B20 Human immunodeficiency virus [HIV] disease: Secondary | ICD-10-CM

## 2013-02-07 DIAGNOSIS — Z113 Encounter for screening for infections with a predominantly sexual mode of transmission: Secondary | ICD-10-CM

## 2013-02-07 DIAGNOSIS — Z79899 Other long term (current) drug therapy: Secondary | ICD-10-CM

## 2013-02-07 LAB — LIPID PANEL
HDL: 38 mg/dL — ABNORMAL LOW (ref >39)
LDL Cholesterol: 80 mg/dL (ref 0–99)
Total CHOL/HDL Ratio: 3.4 Ratio
Triglycerides: 59 mg/dL (ref <150)
VLDL: 12 mg/dL (ref 0–40)

## 2013-02-07 LAB — HEPATITIS B SURFACE ANTIBODY, QUANTITATIVE: Hepatitis B-Post: 0 m[IU]/mL

## 2013-02-07 NOTE — Addendum Note (Signed)
Addended by: Mariea Clonts D on: 02/07/2013 02:58 PM   Modules accepted: Orders

## 2013-02-07 NOTE — Addendum Note (Signed)
Addended by: Mariea Clonts D on: 02/07/2013 02:54 PM   Modules accepted: Orders

## 2013-02-08 LAB — RPR

## 2013-02-08 LAB — T-HELPER CELLS (CD4) COUNT (NOT AT ARMC)
CD4 T Helper %: 31 % — ABNORMAL LOW (ref 32–62)
Total Lymphocyte: 47 % — ABNORMAL HIGH (ref 12–46)

## 2013-02-08 LAB — HIV-1 RNA ULTRAQUANT REFLEX TO GENTYP+: HIV-1 RNA Quant, Log: 1.79 {Log} — ABNORMAL HIGH (ref <1.30)

## 2013-02-10 LAB — HLA B*5701: HLA-B*5701 w/rflx HLA-B High: NEGATIVE

## 2013-02-19 LAB — HIV-1 INTEGRASE GENOTYPE

## 2013-03-08 ENCOUNTER — Ambulatory Visit: Payer: Self-pay | Admitting: Infectious Diseases

## 2013-05-23 ENCOUNTER — Other Ambulatory Visit: Payer: Self-pay | Admitting: Infectious Diseases

## 2013-05-26 ENCOUNTER — Other Ambulatory Visit: Payer: Self-pay | Admitting: *Deleted

## 2013-05-26 ENCOUNTER — Ambulatory Visit: Payer: Self-pay

## 2013-05-26 ENCOUNTER — Encounter: Payer: Self-pay | Admitting: *Deleted

## 2013-05-26 ENCOUNTER — Other Ambulatory Visit (INDEPENDENT_AMBULATORY_CARE_PROVIDER_SITE_OTHER): Payer: Self-pay

## 2013-05-26 DIAGNOSIS — B20 Human immunodeficiency virus [HIV] disease: Secondary | ICD-10-CM

## 2013-05-26 DIAGNOSIS — Z113 Encounter for screening for infections with a predominantly sexual mode of transmission: Secondary | ICD-10-CM

## 2013-05-26 LAB — CBC WITH DIFFERENTIAL/PLATELET
Basophils Absolute: 0 10*3/uL (ref 0.0–0.1)
Basophils Relative: 0 % (ref 0–1)
EOS ABS: 0.1 10*3/uL (ref 0.0–0.7)
Eosinophils Relative: 1 % (ref 0–5)
HEMATOCRIT: 43.8 % (ref 39.0–52.0)
Hemoglobin: 15 g/dL (ref 13.0–17.0)
LYMPHS PCT: 51 % — AB (ref 12–46)
Lymphs Abs: 3 10*3/uL (ref 0.7–4.0)
MCH: 28.3 pg (ref 26.0–34.0)
MCHC: 34.2 g/dL (ref 30.0–36.0)
MCV: 82.6 fL (ref 78.0–100.0)
MONO ABS: 0.5 10*3/uL (ref 0.1–1.0)
Monocytes Relative: 8 % (ref 3–12)
Neutro Abs: 2.3 10*3/uL (ref 1.7–7.7)
Neutrophils Relative %: 40 % — ABNORMAL LOW (ref 43–77)
Platelets: 202 10*3/uL (ref 150–400)
RBC: 5.3 MIL/uL (ref 4.22–5.81)
RDW: 15.8 % — AB (ref 11.5–15.5)
WBC: 5.8 10*3/uL (ref 4.0–10.5)

## 2013-05-26 LAB — COMPREHENSIVE METABOLIC PANEL
ALT: 17 U/L (ref 0–53)
AST: 19 U/L (ref 0–37)
Albumin: 3.9 g/dL (ref 3.5–5.2)
Alkaline Phosphatase: 72 U/L (ref 39–117)
BILIRUBIN TOTAL: 0.4 mg/dL (ref 0.2–1.2)
BUN: 13 mg/dL (ref 6–23)
CO2: 28 mEq/L (ref 19–32)
Calcium: 9 mg/dL (ref 8.4–10.5)
Chloride: 107 mEq/L (ref 96–112)
Creat: 0.9 mg/dL (ref 0.50–1.35)
Glucose, Bld: 94 mg/dL (ref 70–99)
Potassium: 4.2 mEq/L (ref 3.5–5.3)
SODIUM: 137 meq/L (ref 135–145)
TOTAL PROTEIN: 7.2 g/dL (ref 6.0–8.3)

## 2013-05-26 MED ORDER — EMTRICITAB-RILPIVIR-TENOFOV DF 200-25-300 MG PO TABS: ORAL_TABLET | ORAL | Status: DC

## 2013-05-27 LAB — HIV-1 RNA QUANT-NO REFLEX-BLD
HIV 1 RNA Quant: 457 copies/mL — ABNORMAL HIGH (ref <20)
HIV-1 RNA Quant, Log: 2.66 {Log} — ABNORMAL HIGH (ref <1.30)

## 2013-05-27 LAB — URINE CYTOLOGY ANCILLARY ONLY
CHLAMYDIA, DNA PROBE: NEGATIVE
NEISSERIA GONORRHEA: NEGATIVE

## 2013-05-27 LAB — T-HELPER CELL (CD4) - (RCID CLINIC ONLY)
CD4 T CELL HELPER: 29 % — AB (ref 33–55)
CD4 T Cell Abs: 860 /uL (ref 400–2700)

## 2013-06-16 ENCOUNTER — Ambulatory Visit (INDEPENDENT_AMBULATORY_CARE_PROVIDER_SITE_OTHER): Payer: Self-pay | Admitting: Infectious Diseases

## 2013-06-16 ENCOUNTER — Encounter: Payer: Self-pay | Admitting: Infectious Diseases

## 2013-06-16 VITALS — BP 133/85 | HR 93 | Temp 97.2°F | Wt 127.0 lb

## 2013-06-16 DIAGNOSIS — F17200 Nicotine dependence, unspecified, uncomplicated: Secondary | ICD-10-CM

## 2013-06-16 DIAGNOSIS — B009 Herpesviral infection, unspecified: Secondary | ICD-10-CM | POA: Insufficient documentation

## 2013-06-16 DIAGNOSIS — Z113 Encounter for screening for infections with a predominantly sexual mode of transmission: Secondary | ICD-10-CM

## 2013-06-16 DIAGNOSIS — B20 Human immunodeficiency virus [HIV] disease: Secondary | ICD-10-CM

## 2013-06-16 DIAGNOSIS — B351 Tinea unguium: Secondary | ICD-10-CM

## 2013-06-16 LAB — RPR

## 2013-06-16 MED ORDER — TERBINAFINE HCL 250 MG PO TABS: 250.0000 mg | ORAL_TABLET | Freq: Every day | ORAL | Status: DC

## 2013-06-16 MED ORDER — VALACYCLOVIR HCL 500 MG PO TABS: 500.0000 mg | ORAL_TABLET | Freq: Two times a day (BID) | ORAL | Status: DC

## 2013-06-16 MED ORDER — ABACAVIR-DOLUTEGRAVIR-LAMIVUD 600-50-300 MG PO TABS: 1.0000 | ORAL_TABLET | Freq: Every day | ORAL | Status: DC

## 2013-06-16 NOTE — Progress Notes (Signed)
   Subjective:    Patient ID: Junius RoadsCary Schifano, male    DOB: [birth year], 38 y.o.   MRN: [id number]  HPI 38 yo M with hx of anxiety, HIV+ (dx 8-07), started on complera 01-2012. Had Naive genotype 08-30-08. INI naive at last visit. Has been having headaches due to sinus/allergies.  Also worried as his back itches after he had unprotected sex last week in d/c.  Has had change in his toenail color. Curious about rx for this.  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  Hep B S Ab - (july and December 2014).  Has been smoking less since using nicorette. Smoking since 1994.   HIV 1 RNA Quant (copies/mL)  Date Value  05/26/2013 457*  02/07/2013 61*  01/10/2013 848*     CD4 T Cell Abs (/uL)  Date Value  05/26/2013 860   01/10/2013 740   11/15/2012 730      Review of Systems  Constitutional: Negative for appetite change and unexpected weight change.  HENT: Positive for congestion and sinus pressure.   Gastrointestinal: Negative for diarrhea and constipation.  Genitourinary: Negative for difficulty urinating.  Allergic/Immunologic: Positive for environmental allergies.       Objective:   Physical Exam  Constitutional: He appears well-developed and well-nourished.  HENT:  Mouth/Throat: No oropharyngeal exudate.  Eyes: EOM are normal. Pupils are equal, round, and reactive to light.  Neck: Neck supple.  Cardiovascular: Normal rate, regular rhythm and normal heart sounds.   Pulmonary/Chest: Effort normal and breath sounds normal.  Abdominal: Soft. Bowel sounds are normal. There is no tenderness. There is no rebound.  Lymphadenopathy:    He has no cervical adenopathy.  Skin:  Few fever blisters at R angle of mouth.  Faint rash on trunk/back.           Assessment & Plan:

## 2013-06-16 NOTE — Assessment & Plan Note (Addendum)
Denies missed doses. Will change him to triumeq. He is given condoms today.  Restart Hep B series.  Will see him back in 3-4 months.

## 2013-06-16 NOTE — Assessment & Plan Note (Signed)
Will give him rx for valtrex.

## 2013-06-16 NOTE — Assessment & Plan Note (Signed)
Will give him rx for terbinafine.  

## 2013-06-16 NOTE — Assessment & Plan Note (Signed)
Encouraged to quit. 

## 2013-06-17 LAB — URINE CYTOLOGY ANCILLARY ONLY
Chlamydia: NEGATIVE
Neisseria Gonorrhea: NEGATIVE

## 2013-06-20 ENCOUNTER — Telehealth: Payer: Self-pay | Admitting: *Deleted

## 2013-06-20 NOTE — Progress Notes (Signed)
Called and left patient a voice mail to call the clinic to schedule a lab appt per Dr. Ninetta LightsHatcher. Wendall MolaJacqueline Raekwon Winkowski

## 2013-06-20 NOTE — Telephone Encounter (Signed)
Message copied by Macy MisOCKERHAM, JACQUELINE A on Mon Jun 20, 2013  2:16 PM ------      Message from: HATCHER, JEFFREY C      Created: Mon Jun 20, 2013  2:02 PM       Can mr [name] come in June or July? ------

## 2013-06-20 NOTE — Telephone Encounter (Signed)
Called patient and left voice mail to call the clinic to schedule a lab appointment. Per Dr. Ninetta LightsHatcher if his viral load is undetectable he can keep his August MD appointment. If not he would like to see him in either June or July. Wendall MolaJacqueline Cockerham

## 2013-06-20 NOTE — Telephone Encounter (Signed)
Patient hasn't started new medication yet.  Will call today to set up delivery.  Gave patient lab appointment 5/18 to see if he is undetectable.  Lab orders in chart. Andree CossMichelle M Shakerria Parran, RN

## 2013-06-21 NOTE — Progress Notes (Signed)
Patient scheduled lab appt for 07/18/13

## 2013-06-22 ENCOUNTER — Ambulatory Visit: Payer: Self-pay | Admitting: Infectious Diseases

## 2013-06-22 ENCOUNTER — Telehealth: Payer: Self-pay | Admitting: *Deleted

## 2013-06-22 NOTE — Telephone Encounter (Signed)
Patient unable to afford Lamisil, not covered under [employer]. Is there something else that can be prescribed for onychomycosis that is on [employer] formulary? Would fluconazole be an alternative; please advise. Rodney Chase

## 2013-06-23 ENCOUNTER — Other Ambulatory Visit: Payer: Self-pay | Admitting: *Deleted

## 2013-06-23 DIAGNOSIS — B351 Tinea unguium: Secondary | ICD-10-CM

## 2013-06-23 MED ORDER — FLUCONAZOLE 100 MG PO TABS: 100.0000 mg | ORAL_TABLET | Freq: Every day | ORAL | Status: DC

## 2013-06-23 NOTE — Telephone Encounter (Signed)
Fluconazole 100mg  daily. 6 weeks. 1 refill

## 2013-06-23 NOTE — Telephone Encounter (Signed)
Done

## 2013-06-23 NOTE — Progress Notes (Signed)
Ordered per Dr. Ninetta LightsHatcher to replace the lamisil. Fluconazole 100mg  daily. 6 weeks. 1 refill

## 2013-07-18 ENCOUNTER — Other Ambulatory Visit: Payer: Self-pay

## 2013-08-09 ENCOUNTER — Telehealth: Payer: Self-pay | Admitting: *Deleted

## 2013-08-09 NOTE — Telephone Encounter (Signed)
Called patient and rescheduled his lab appt for 08/12/13 and given first available with Dr. Ninetta Lights for 09/21/13. Rodney Chase

## 2013-08-12 ENCOUNTER — Other Ambulatory Visit: Payer: Self-pay

## 2013-08-15 ENCOUNTER — Other Ambulatory Visit: Payer: Self-pay | Admitting: Infectious Diseases

## 2013-08-16 ENCOUNTER — Other Ambulatory Visit (INDEPENDENT_AMBULATORY_CARE_PROVIDER_SITE_OTHER): Payer: Self-pay

## 2013-08-16 DIAGNOSIS — B20 Human immunodeficiency virus [HIV] disease: Secondary | ICD-10-CM

## 2013-08-17 LAB — HIV-1 RNA ULTRAQUANT REFLEX TO GENTYP+

## 2013-08-17 LAB — T-HELPER CELL (CD4) - (RCID CLINIC ONLY)
CD4 T CELL HELPER: 37 % (ref 33–55)
CD4 T Cell Abs: 860 /uL (ref 400–2700)

## 2013-08-23 ENCOUNTER — Ambulatory Visit: Payer: Self-pay | Admitting: Infectious Diseases

## 2013-08-25 LAB — HIV-1 INTEGRASE GENOTYPE

## 2013-09-21 ENCOUNTER — Ambulatory Visit: Payer: Self-pay | Admitting: Infectious Diseases

## 2013-09-26 ENCOUNTER — Ambulatory Visit: Payer: Self-pay | Admitting: Infectious Diseases

## 2013-10-03 ENCOUNTER — Other Ambulatory Visit: Payer: Self-pay

## 2013-10-05 ENCOUNTER — Other Ambulatory Visit: Payer: Self-pay

## 2013-10-11 ENCOUNTER — Other Ambulatory Visit: Payer: Self-pay | Admitting: Licensed Clinical Social Worker

## 2013-10-11 ENCOUNTER — Other Ambulatory Visit: Payer: Self-pay | Admitting: Infectious Diseases

## 2013-10-11 ENCOUNTER — Other Ambulatory Visit (INDEPENDENT_AMBULATORY_CARE_PROVIDER_SITE_OTHER): Payer: Self-pay

## 2013-10-11 DIAGNOSIS — B20 Human immunodeficiency virus [HIV] disease: Secondary | ICD-10-CM

## 2013-10-11 LAB — COMPLETE METABOLIC PANEL WITH GFR
ALK PHOS: 56 U/L (ref 39–117)
AST: 15 U/L (ref 0–37)
Albumin: 4.1 g/dL (ref 3.5–5.2)
BUN: 11 mg/dL (ref 6–23)
CO2: 31 meq/L (ref 19–32)
CREATININE: 1 mg/dL (ref 0.50–1.35)
Calcium: 9.3 mg/dL (ref 8.4–10.5)
Chloride: 100 mEq/L (ref 96–112)
GFR, Est Non African American: >89 mL/min
Glucose, Bld: 149 mg/dL — ABNORMAL HIGH (ref 70–99)
Potassium: 4.2 mEq/L (ref 3.5–5.3)
SODIUM: 138 meq/L (ref 135–145)
TOTAL PROTEIN: 7 g/dL (ref 6.0–8.3)
Total Bilirubin: 0.3 mg/dL (ref 0.2–1.2)

## 2013-10-11 LAB — CBC WITH DIFFERENTIAL/PLATELET
Basophils Absolute: 0 10*3/uL (ref 0.0–0.1)
Basophils Relative: 0 % (ref 0–1)
Eosinophils Absolute: 0.1 10*3/uL (ref 0.0–0.7)
Eosinophils Relative: 1 % (ref 0–5)
HCT: 42.3 % (ref 39.0–52.0)
HEMOGLOBIN: 14.1 g/dL (ref 13.0–17.0)
LYMPHS ABS: 2.5 10*3/uL (ref 0.7–4.0)
LYMPHS PCT: 43 % (ref 12–46)
MCH: 28.5 pg (ref 26.0–34.0)
MCHC: 33.3 g/dL (ref 30.0–36.0)
MCV: 85.6 fL (ref 78.0–100.0)
MONOS PCT: 5 % (ref 3–12)
Monocytes Absolute: 0.3 10*3/uL (ref 0.1–1.0)
NEUTROS ABS: 2.9 10*3/uL (ref 1.7–7.7)
Neutrophils Relative %: 51 % (ref 43–77)
Platelets: 230 10*3/uL (ref 150–400)
RBC: 4.94 MIL/uL (ref 4.22–5.81)
RDW: 15.5 % (ref 11.5–15.5)
WBC: 5.7 10*3/uL (ref 4.0–10.5)

## 2013-10-12 LAB — T-HELPER CELL (CD4) - (RCID CLINIC ONLY)
CD4 T CELL ABS: 780 /uL (ref 400–2700)
CD4 T CELL HELPER: 32 % — AB (ref 33–55)

## 2013-10-12 LAB — HIV-1 RNA QUANT-NO REFLEX-BLD
HIV 1 RNA Quant: 120 copies/mL — ABNORMAL HIGH (ref <20)
HIV-1 RNA QUANT, LOG: 2.08 {Log} — AB (ref <1.30)

## 2013-10-19 ENCOUNTER — Ambulatory Visit (INDEPENDENT_AMBULATORY_CARE_PROVIDER_SITE_OTHER): Payer: Self-pay | Admitting: Infectious Diseases

## 2013-10-19 ENCOUNTER — Encounter: Payer: Self-pay | Admitting: Infectious Diseases

## 2013-10-19 VITALS — BP 145/102 | HR 89 | Temp 98.2°F | Ht 63.0 in | Wt 124.0 lb

## 2013-10-19 DIAGNOSIS — Z79899 Other long term (current) drug therapy: Secondary | ICD-10-CM

## 2013-10-19 DIAGNOSIS — B20 Human immunodeficiency virus [HIV] disease: Secondary | ICD-10-CM

## 2013-10-19 DIAGNOSIS — Z113 Encounter for screening for infections with a predominantly sexual mode of transmission: Secondary | ICD-10-CM

## 2013-10-19 DIAGNOSIS — K089 Disorder of teeth and supporting structures, unspecified: Secondary | ICD-10-CM | POA: Insufficient documentation

## 2013-10-19 DIAGNOSIS — F17200 Nicotine dependence, unspecified, uncomplicated: Secondary | ICD-10-CM

## 2013-10-19 DIAGNOSIS — Z23 Encounter for immunization: Secondary | ICD-10-CM

## 2013-10-19 NOTE — Progress Notes (Signed)
   Subjective:    Patient ID: Rodney Chase, male    DOB: 1976/01/04, 38 y.o.   MRN: [id number]  HPI 38 yo M with hx of anxiety, HIV+ (dx 8-07), started on complera 01-2012. Had Naive genotype 08-30-08. INI naive at last visit.  Was changed to Triumeq on 2015.  Feels well.  Has rash on his legs and arms (attributes to a medicated lotion he uses).  Hep B S Ab - (july and December 2014).  Has been smoking less. Smoking since 1994.  HIV 1 RNA Quant (copies/mL)  Date Value  10/11/2013 120*  08/16/2013 <20   05/26/2013 457*     CD4 T Cell Abs (/uL)  Date Value  10/11/2013 780   08/16/2013 860   05/26/2013 860     Review of Systems  Constitutional: Negative for appetite change and unexpected weight change.  Gastrointestinal: Negative for diarrhea and constipation.  Genitourinary: Negative for difficulty urinating.       Objective:   Physical Exam  Constitutional: He appears well-developed and well-nourished.  HENT:  Mouth/Throat: No oropharyngeal exudate.  Eyes: EOM are normal. Pupils are equal, round, and reactive to light.  Neck: Neck supple.  Cardiovascular: Normal rate, regular rhythm and normal heart sounds.   Pulmonary/Chest: Effort normal and breath sounds normal.  Abdominal: Soft. Bowel sounds are normal. He exhibits no distension. There is no tenderness.  Lymphadenopathy:    He has no cervical adenopathy.          Assessment & Plan:

## 2013-10-19 NOTE — Assessment & Plan Note (Signed)
Will try to get him into dental clinic.  

## 2013-10-19 NOTE — Assessment & Plan Note (Signed)
He is given condoms. Gets flu shot. Encouraged him to take his ART. Consider restart Hep B series. Will see him back in 6 months with labs (reflex to genotype).

## 2013-10-19 NOTE — Assessment & Plan Note (Signed)
Encouraged him to quit 

## 2013-11-02 ENCOUNTER — Telehealth: Payer: Self-pay | Admitting: *Deleted

## 2013-11-02 NOTE — Telephone Encounter (Signed)
Patient called c/o dental pain and wanted to know when he can be seen by the dental clinic. Advised patient to call [facility] first thing in the morning; as there is only one dental clinic this month. Phone number provided Wendall Mola

## 2013-11-10 ENCOUNTER — Other Ambulatory Visit: Payer: Self-pay | Admitting: Infectious Diseases

## 2014-01-11 ENCOUNTER — Other Ambulatory Visit: Payer: Self-pay | Admitting: Licensed Clinical Social Worker

## 2014-01-11 DIAGNOSIS — B20 Human immunodeficiency virus [HIV] disease: Secondary | ICD-10-CM

## 2014-01-11 MED ORDER — ABACAVIR-DOLUTEGRAVIR-LAMIVUD 600-50-300 MG PO TABS: 1.0000 | ORAL_TABLET | Freq: Every day | ORAL | Status: DC

## 2014-01-30 ENCOUNTER — Other Ambulatory Visit (INDEPENDENT_AMBULATORY_CARE_PROVIDER_SITE_OTHER): Payer: Self-pay

## 2014-01-30 DIAGNOSIS — Z79899 Other long term (current) drug therapy: Secondary | ICD-10-CM

## 2014-01-30 DIAGNOSIS — B20 Human immunodeficiency virus [HIV] disease: Secondary | ICD-10-CM

## 2014-01-30 LAB — COMPREHENSIVE METABOLIC PANEL
ALK PHOS: 46 U/L (ref 39–117)
ALT: 8 U/L (ref 0–53)
AST: 12 U/L (ref 0–37)
Albumin: 3.8 g/dL (ref 3.5–5.2)
BILIRUBIN TOTAL: 0.4 mg/dL (ref 0.2–1.2)
BUN: 11 mg/dL (ref 6–23)
CO2: 28 mEq/L (ref 19–32)
CREATININE: 0.95 mg/dL (ref 0.50–1.35)
Calcium: 9.3 mg/dL (ref 8.4–10.5)
Chloride: 105 mEq/L (ref 96–112)
Glucose, Bld: 93 mg/dL (ref 70–99)
Potassium: 3.9 mEq/L (ref 3.5–5.3)
Sodium: 140 mEq/L (ref 135–145)
Total Protein: 6.6 g/dL (ref 6.0–8.3)

## 2014-01-30 LAB — LIPID PANEL
CHOL/HDL RATIO: 2.8 ratio
Cholesterol: 123 mg/dL (ref 0–200)
HDL: 44 mg/dL (ref >39)
LDL CALC: 69 mg/dL (ref 0–99)
Triglycerides: 49 mg/dL (ref <150)
VLDL: 10 mg/dL (ref 0–40)

## 2014-01-30 LAB — CBC
HCT: 40.4 % (ref 39.0–52.0)
Hemoglobin: 13.2 g/dL (ref 13.0–17.0)
MCH: 28 pg (ref 26.0–34.0)
MCHC: 32.7 g/dL (ref 30.0–36.0)
MCV: 85.6 fL (ref 78.0–100.0)
MPV: 8.7 fL — ABNORMAL LOW (ref 9.4–12.4)
PLATELETS: 230 10*3/uL (ref 150–400)
RBC: 4.72 MIL/uL (ref 4.22–5.81)
RDW: 15.5 % (ref 11.5–15.5)
WBC: 8 10*3/uL (ref 4.0–10.5)

## 2014-01-31 ENCOUNTER — Other Ambulatory Visit: Payer: Self-pay

## 2014-01-31 LAB — T-HELPER CELL (CD4) - (RCID CLINIC ONLY)
CD4 % Helper T Cell: 35 % (ref 33–55)
CD4 T CELL ABS: 1060 /uL (ref 400–2700)

## 2014-01-31 LAB — HIV-1 RNA ULTRAQUANT REFLEX TO GENTYP+
HIV 1 RNA Quant: <20 copies/mL (ref <20)
HIV-1 RNA Quant, Log: <1.3 {Log} (ref <1.30)

## 2014-02-07 ENCOUNTER — Ambulatory Visit (INDEPENDENT_AMBULATORY_CARE_PROVIDER_SITE_OTHER): Payer: Self-pay | Admitting: Internal Medicine

## 2014-02-07 ENCOUNTER — Encounter: Payer: Self-pay | Admitting: Internal Medicine

## 2014-02-07 ENCOUNTER — Telehealth: Payer: Self-pay | Admitting: *Deleted

## 2014-02-07 VITALS — BP 126/83 | HR 80 | Temp 98.5°F | Wt 123.5 lb

## 2014-02-07 DIAGNOSIS — R630 Anorexia: Secondary | ICD-10-CM

## 2014-02-07 DIAGNOSIS — R197 Diarrhea, unspecified: Secondary | ICD-10-CM

## 2014-02-07 DIAGNOSIS — G479 Sleep disorder, unspecified: Secondary | ICD-10-CM

## 2014-02-07 DIAGNOSIS — G4720 Circadian rhythm sleep disorder, unspecified type: Secondary | ICD-10-CM

## 2014-02-07 DIAGNOSIS — R5383 Other fatigue: Secondary | ICD-10-CM

## 2014-02-07 NOTE — Telephone Encounter (Signed)
Patient called c/o diarrhea x 2 weeks. Also having issues with depression and has not taken his HIV med x 3 days; he plans to restart it today. Requesting an appointment and scheduled with Dr. Orvan Falconerampbell for 3:30 pm today. Wendall MolaJacqueline Cockerham CMA

## 2014-02-07 NOTE — Progress Notes (Signed)
Watery green diarrhea 7-10 x/day after taking oral antibiotic for dental problem approx 2 weeks ago.  Pt has had diarrhea for approx. 2 weeks.  Also having decrease appetite, sleep disturbance, depressive symptoms.  Called a friend living in [place] to come stay with him for several days.  And this friend will be staying with him for several days.  Pt contracts for safety, [facility].  Pt contracts to return to [facility] this Thursday for follow-up appt with MD to find out stool study results and discuss how his visits to services went for him.

## 2014-02-08 ENCOUNTER — Telehealth: Payer: Self-pay | Admitting: *Deleted

## 2014-02-08 NOTE — Telephone Encounter (Signed)
Received call from [facility] at [facility]. There was no stool in the white tube.  As such, they were unable to run the lab for C. Diff. Andree CossHowell, Daveah Varone M, RN

## 2014-02-08 NOTE — Telephone Encounter (Signed)
Folllow-up phone call from [facility] 02/07/14.  Wanting to find out how the pt is feeling today.  Unable reach pt.  His phone number does not accept incoming phone calls.

## 2014-02-09 ENCOUNTER — Ambulatory Visit: Payer: Self-pay | Admitting: Internal Medicine

## 2014-02-10 LAB — OVA AND PARASITE EXAMINATION

## 2014-02-12 LAB — HIV-1 INTEGRASE GENOTYPE

## 2014-02-13 LAB — STOOL CULTURE

## 2014-02-20 ENCOUNTER — Encounter: Payer: Self-pay | Admitting: Infectious Diseases

## 2014-02-20 ENCOUNTER — Telehealth: Payer: Self-pay | Admitting: *Deleted

## 2014-02-20 ENCOUNTER — Ambulatory Visit (INDEPENDENT_AMBULATORY_CARE_PROVIDER_SITE_OTHER): Payer: Self-pay | Admitting: Infectious Diseases

## 2014-02-20 VITALS — BP 129/78 | HR 91 | Temp 98.5°F | Wt 126.0 lb

## 2014-02-20 DIAGNOSIS — F411 Generalized anxiety disorder: Secondary | ICD-10-CM

## 2014-02-20 DIAGNOSIS — F17200 Nicotine dependence, unspecified, uncomplicated: Secondary | ICD-10-CM

## 2014-02-20 DIAGNOSIS — R55 Syncope and collapse: Secondary | ICD-10-CM

## 2014-02-20 DIAGNOSIS — K088 Other specified disorders of teeth and supporting structures: Secondary | ICD-10-CM

## 2014-02-20 DIAGNOSIS — K089 Disorder of teeth and supporting structures, unspecified: Secondary | ICD-10-CM

## 2014-02-20 DIAGNOSIS — Z720 Tobacco use: Secondary | ICD-10-CM

## 2014-02-20 DIAGNOSIS — Z113 Encounter for screening for infections with a predominantly sexual mode of transmission: Secondary | ICD-10-CM

## 2014-02-20 DIAGNOSIS — Z23 Encounter for immunization: Secondary | ICD-10-CM

## 2014-02-20 DIAGNOSIS — Z79899 Other long term (current) drug therapy: Secondary | ICD-10-CM

## 2014-02-20 DIAGNOSIS — B20 Human immunodeficiency virus [HIV] disease: Secondary | ICD-10-CM

## 2014-02-20 NOTE — Addendum Note (Signed)
Addended by: Wendall MolaOCKERHAM, JACQUELINE A on: 02/20/2014 04:22 PM   Modules accepted: Orders

## 2014-02-20 NOTE — Assessment & Plan Note (Signed)
Episodes preceded by headaches, feels dark, goes out on the floor. Feels heart beat increasing.

## 2014-02-20 NOTE — Assessment & Plan Note (Signed)
Encouraged to smoke.

## 2014-02-20 NOTE — Assessment & Plan Note (Signed)
Need to get back into dental.

## 2014-02-20 NOTE — Assessment & Plan Note (Addendum)
He is doing well. Offered/provided condoms. Has gotten flu shot. Restart Hep B series.  Will see him back in 6 months.

## 2014-02-20 NOTE — Assessment & Plan Note (Signed)
Encouraged him to see counselor.

## 2014-02-20 NOTE — Telephone Encounter (Signed)
Patient needs referral to Neurology. He is uninsured and [facility] to [facility] and he said he plans to get insurance soon and will call once he does this. Wendall MolaJacqueline Cockerham

## 2014-02-20 NOTE — Progress Notes (Signed)
   Subjective:    Patient ID: Rodney RoadsCary Chase, male    DOB: 1976/02/12, 38 y.o.   MRN: [id number]  HPI 38 yo M with hx of anxiety, HIV+ (dx 8-07), started on complera 01-2012. Had Naive genotype 08-30-08. INI naive at last visit.  Was changed to Triumeq on 2015. Taking regularly.   Has been feeling depressed, has lost friends to HIV, has no job. Today he feels happy though. [facility], [facility] for counseling.  Still smoking, has cut back.   HIV 1 RNA QUANT (copies/mL)  Date Value  01/30/2014 <20  10/11/2013 120*  08/16/2013 <20   CD4 T CELL ABS (/uL)  Date Value  01/30/2014 1060  10/11/2013 780  08/16/2013 860   occas takes megace.   Review of Systems  Constitutional: Positive for appetite change. Negative for unexpected weight change.  Gastrointestinal: Negative for diarrhea and constipation.  Genitourinary: Negative for difficulty urinating.  Psychiatric/Behavioral: Positive for dysphoric mood.      Objective:   Physical Exam  Constitutional: He appears well-developed and well-nourished.  HENT:  Mouth/Throat: No oropharyngeal exudate.  Normal temporal pulses  Eyes: EOM are normal. Pupils are equal, round, and reactive to light.  Neck: Neck supple.  Cardiovascular: Normal rate, regular rhythm and normal heart sounds.   Pulmonary/Chest: Effort normal and breath sounds normal.  Abdominal: Soft. Bowel sounds are normal. He exhibits no distension. There is no tenderness.  Lymphadenopathy:    He has no cervical adenopathy.          Assessment & Plan:

## 2014-03-21 ENCOUNTER — Ambulatory Visit (INDEPENDENT_AMBULATORY_CARE_PROVIDER_SITE_OTHER): Payer: Self-pay | Admitting: *Deleted

## 2014-03-21 DIAGNOSIS — Z23 Encounter for immunization: Secondary | ICD-10-CM

## 2014-03-21 DIAGNOSIS — B20 Human immunodeficiency virus [HIV] disease: Secondary | ICD-10-CM

## 2014-03-23 ENCOUNTER — Ambulatory Visit: Payer: Self-pay

## 2014-08-01 ENCOUNTER — Emergency Department (HOSPITAL_BASED_OUTPATIENT_CLINIC_OR_DEPARTMENT_OTHER)
Admission: EM | Admit: 2014-08-01 | Discharge: 2014-08-01 | Disposition: A | Discharge status: Home / Self Care | Payer: Self-pay | Attending: Emergency Medicine | Admitting: Emergency Medicine

## 2014-08-01 ENCOUNTER — Encounter (HOSPITAL_BASED_OUTPATIENT_CLINIC_OR_DEPARTMENT_OTHER): Payer: Self-pay | Admitting: Emergency Medicine

## 2014-08-01 DIAGNOSIS — Z880 Allergy status to penicillin: Secondary | ICD-10-CM | POA: Insufficient documentation

## 2014-08-01 DIAGNOSIS — Z21 Asymptomatic human immunodeficiency virus [HIV] infection status: Secondary | ICD-10-CM | POA: Insufficient documentation

## 2014-08-01 DIAGNOSIS — L02214 Cutaneous abscess of groin: Secondary | ICD-10-CM | POA: Insufficient documentation

## 2014-08-01 DIAGNOSIS — Z720 Tobacco use: Secondary | ICD-10-CM | POA: Insufficient documentation

## 2014-08-01 DIAGNOSIS — Z79899 Other long term (current) drug therapy: Secondary | ICD-10-CM | POA: Insufficient documentation

## 2014-08-01 MED ORDER — LIDOCAINE HCL (PF) 1 % IJ SOLN
5.0000 mL | Freq: Once | INTRAMUSCULAR | Status: AC
  Administered 2014-08-01: 5 mL
  Filled 2014-08-01: qty 5

## 2014-08-01 MED ORDER — IBUPROFEN 800 MG PO TABS
800.0000 mg | ORAL_TABLET | Freq: Once | ORAL | Status: AC
  Administered 2014-08-01: 800 mg via ORAL
  Filled 2014-08-01: qty 1

## 2014-08-01 NOTE — ED Notes (Signed)
Pt d/c home with instructions for wound care and follow up

## 2014-08-01 NOTE — ED Notes (Addendum)
Pt in c/o abscess to R groin area x 3 days, no drainage noted. Pt is HIV+.

## 2014-08-01 NOTE — Discharge Instructions (Signed)

## 2014-08-01 NOTE — ED Provider Notes (Signed)
CSN: [id number]     Arrival date & time 08/01/14  1338 History   First MD Initiated Contact with Patient 08/01/14 1349     Chief Complaint  Patient presents with  . Abscess     (Consider location/radiation/quality/duration/timing/severity/associated sxs/prior Treatment) HPI Pt is a 39yo male with hx of HIV well controlled at ID clinic, presenting to ED with c/o 3 day hx of gradually worsening pain and swelling in Right groin. Pain is constant, aching and sharp, 8/10 at worst, worse with walking and certain positions. Denies previous hx of boils. Does report shaving in his groin area. Denies fever, chills, n/v/d. Denies urinary or penile symptoms.   Past Medical History  Diagnosis Date  . HIV (human immunodeficiency virus infection)    History reviewed. No pertinent past surgical history. Family History  Problem Relation Age of Onset  . Diabetes Daughter   . Cancer Mother     breast   History  Substance Use Topics  . Smoking status: Current Every Day Smoker -- 0.30 packs/day    Types: Cigarettes  . Smokeless tobacco: Never Used  . Alcohol Use: No    Review of Systems  Constitutional: Negative for fever, chills and appetite change.  Gastrointestinal: Negative for nausea, vomiting, abdominal pain and diarrhea.  Genitourinary: Positive for scrotal swelling and testicular pain (Right side Right side, abscess). Negative for dysuria, urgency, frequency, hematuria, flank pain, decreased urine volume, discharge, penile swelling and penile pain.  All other systems reviewed and are negative.     Allergies  Penicillins  Home Medications   Prior to Admission medications   Medication Sig Start Date End Date Taking? Authorizing Provider  Abacavir-Dolutegravir-Lamivud 600-50-300 MG TABS Take 1 tablet by mouth daily. 01/11/14   Ginnie Smart, MD  valACYclovir (VALTREX) 500 MG tablet TAKE 1 TABLET BY MOUTH TWICE DAILY Patient not taking: Reported on 02/20/2014 08/15/13   Ginnie Smart, MD   BP 125/97 mmHg  Pulse 111  Temp(Src) 98.2 F (36.8 C) (Oral)  Resp 18  Ht  (1.626 m)  Wt 130 lb (58.968 kg)  BMI 22.30 kg/m2  SpO2 100% Physical Exam  Constitutional: He is oriented to person, place, and time. He appears well-developed and well-nourished.  HENT:  Head: Normocephalic and atraumatic.  Eyes: EOM are normal.  Neck: Normal range of motion.  Cardiovascular: Normal rate.   Pulmonary/Chest: Effort normal.  Abdominal: Hernia confirmed negative in the right inguinal area.  Genitourinary:    Right testis shows mass and tenderness.  Right groin/scrotum: 3cm area of induration and tenderness with centralized erythema. No active bleeding or discharge. No red streaking.   Musculoskeletal: Normal range of motion.  Neurological: He is alert and oriented to person, place, and time.  Skin: Skin is warm and dry.  Psychiatric: He has a normal mood and affect. His behavior is normal.  Nursing note and vitals reviewed.   ED Course  Procedures   INCISION AND DRAINAGE Performed by: Junius Finner A. Consent: Verbal consent obtained. Risks and benefits: risks, benefits and alternatives were discussed Type: abscess  Body area: Right groin  Anesthesia: local infiltration  Incision was made with a scalpel.  Local anesthetic: lidocaine 1% without epinephrine  Anesthetic total: 0.5 ml  Complexity: complex Blunt dissection to break up loculations  Drainage: purulent  Drainage amount: 2cc  Packing material: none  Patient tolerance: Patient tolerated the procedure well with no immediate complications.     Labs Review Labs Reviewed - No data to  display  Imaging Review No results found.   EKG Interpretation None      MDM   Final diagnoses:  Abscess of right groin    Pt is a 39yo male with hx of HIV, well controlled. Pt has routine f/u in 2 weeks. Pt presenting to ED with c/o abscess to Right groin. I&D performed successfully in ED.  Pt  is afebrile. Viral load is undetectable 6 months ago.  Will advised pt to f/u in 2-3 days for wound recheck by PCP. Return precautions provided. Pt verbalized understanding and agreement with tx plan.     Junius Finnerrin O'Malley, PA-C 08/01/14 1440  Azalia BilisKevin Campos, MD 08/01/14 [id number]

## 2014-08-01 NOTE — ED Notes (Signed)
[facility]

## 2014-08-08 ENCOUNTER — Other Ambulatory Visit: Payer: Self-pay

## 2014-08-09 ENCOUNTER — Other Ambulatory Visit: Payer: Self-pay

## 2014-08-09 ENCOUNTER — Ambulatory Visit: Payer: Self-pay | Admitting: Internal Medicine

## 2014-08-17 ENCOUNTER — Ambulatory Visit: Payer: Self-pay | Admitting: Infectious Diseases

## 2014-08-23 ENCOUNTER — Ambulatory Visit: Payer: Self-pay | Admitting: Infectious Diseases

## 2014-09-13 ENCOUNTER — Telehealth: Payer: Self-pay | Admitting: *Deleted

## 2014-09-13 NOTE — Telephone Encounter (Signed)
[facility] is [facility] [place]  [facility] received.  Records sent.

## 2014-09-20 ENCOUNTER — Telehealth: Payer: Self-pay | Admitting: *Deleted

## 2014-09-20 NOTE — Telephone Encounter (Signed)
Patient called asking us to [facility] [phone], fax [phone]. They need a signed release and patient can come here to sign. His phone is not accepting incoming calls.

## 2014-11-01 ENCOUNTER — Encounter: Payer: Self-pay | Admitting: Infectious Diseases

## 2014-11-01 ENCOUNTER — Ambulatory Visit (INDEPENDENT_AMBULATORY_CARE_PROVIDER_SITE_OTHER): Payer: Self-pay | Admitting: Infectious Diseases

## 2014-11-01 ENCOUNTER — Telehealth: Payer: Self-pay | Admitting: *Deleted

## 2014-11-01 VITALS — BP 149/85 | HR 76 | Temp 97.7°F | Ht 63.0 in | Wt 133.0 lb

## 2014-11-01 DIAGNOSIS — L732 Hidradenitis suppurativa: Secondary | ICD-10-CM | POA: Insufficient documentation

## 2014-11-01 DIAGNOSIS — R55 Syncope and collapse: Secondary | ICD-10-CM

## 2014-11-01 DIAGNOSIS — I10 Essential (primary) hypertension: Secondary | ICD-10-CM | POA: Insufficient documentation

## 2014-11-01 DIAGNOSIS — B20 Human immunodeficiency virus [HIV] disease: Secondary | ICD-10-CM

## 2014-11-01 DIAGNOSIS — F17200 Nicotine dependence, unspecified, uncomplicated: Secondary | ICD-10-CM

## 2014-11-01 DIAGNOSIS — Z23 Encounter for immunization: Secondary | ICD-10-CM

## 2014-11-01 DIAGNOSIS — Z720 Tobacco use: Secondary | ICD-10-CM

## 2014-11-01 LAB — COMPREHENSIVE METABOLIC PANEL
ALK PHOS: 49 U/L (ref 40–115)
ALT: 9 U/L (ref 9–46)
AST: 16 U/L (ref 10–40)
Albumin: 3.9 g/dL (ref 3.6–5.1)
BILIRUBIN TOTAL: 0.5 mg/dL (ref 0.2–1.2)
BUN: 14 mg/dL (ref 7–25)
CO2: 27 mmol/L (ref 20–31)
Calcium: 9.4 mg/dL (ref 8.6–10.3)
Chloride: 105 mmol/L (ref 98–110)
Creat: 1.1 mg/dL (ref 0.60–1.35)
GLUCOSE: 97 mg/dL (ref 65–99)
Potassium: 4.4 mmol/L (ref 3.5–5.3)
Sodium: 142 mmol/L (ref 135–146)
TOTAL PROTEIN: 6.8 g/dL (ref 6.1–8.1)

## 2014-11-01 LAB — LIPID PANEL
CHOLESTEROL: 153 mg/dL (ref 125–200)
HDL: 48 mg/dL (ref >=40)
LDL Cholesterol: 94 mg/dL (ref <130)
Total CHOL/HDL Ratio: 3.2 Ratio (ref <=5.0)
Triglycerides: 57 mg/dL (ref <150)
VLDL: 11 mg/dL (ref <30)

## 2014-11-01 LAB — CBC
HEMATOCRIT: 42.5 % (ref 39.0–52.0)
Hemoglobin: 14 g/dL (ref 13.0–17.0)
MCH: 28.2 pg (ref 26.0–34.0)
MCHC: 32.9 g/dL (ref 30.0–36.0)
MCV: 85.7 fL (ref 78.0–100.0)
MPV: 9.2 fL (ref 8.6–12.4)
PLATELETS: 222 10*3/uL (ref 150–400)
RBC: 4.96 MIL/uL (ref 4.22–5.81)
RDW: 15.6 % — AB (ref 11.5–15.5)
WBC: 7 10*3/uL (ref 4.0–10.5)

## 2014-11-01 MED ORDER — ABACAVIR-DOLUTEGRAVIR-LAMIVUD 600-50-300 MG PO TABS: 1.0000 | ORAL_TABLET | Freq: Every day | ORAL | Status: DC

## 2014-11-01 NOTE — Assessment & Plan Note (Signed)
Pt does not want to restart his medicine. He will f/u with [place], we will rechck his BP at his next visit.

## 2014-11-01 NOTE — Assessment & Plan Note (Signed)
Encouraged pt to quit smoking

## 2014-11-01 NOTE — Assessment & Plan Note (Signed)
Doing well.  Will complete hep B Will give flu Given condoms Will check labs today.  Has THP Will renew ADAP rtc in 6 months.

## 2014-11-01 NOTE — Assessment & Plan Note (Signed)
No further episodes.  Will mark as resolved.  

## 2014-11-01 NOTE — Telephone Encounter (Signed)
Error

## 2014-11-01 NOTE — Progress Notes (Signed)
   Subjective:    Patient ID: Rodney Chase, male    DOB: October 20, 1975, 39 y.o.   MRN: [id number]  HPI 39 yo M with hx of anxiety, HIV+ (dx 8-07), started on complera 01-2012. Had Naive genotype 08-30-08. INI naive at last visit.  Was changed to Triumeq on 2015. He was seen in ED since last visit for groin abscess. Also has been incarcerated. (6-2 to 7-12). Has been off all meds except triumeq. Had labs done in jail, we don't have.   HIV 1 RNA QUANT (copies/mL)  Date Value  01/30/2014 <20  10/11/2013 120*  08/16/2013 <20   CD4 T CELL ABS (/uL)  Date Value  01/30/2014 1060  10/11/2013 780  08/16/2013 860    Having episodes of l scapular shooting pain, hypresthesia of his L arm. Denies hx of shingles.   Review of Systems  Constitutional: Negative for fever, chills, appetite change and unexpected weight change.  Gastrointestinal: Negative for diarrhea and constipation.  Genitourinary: Negative for difficulty urinating.  Skin: Positive for rash.  Neurological: Negative for headaches.       Objective:   Physical Exam  Constitutional: He appears well-developed and well-nourished.  HENT:  Mouth/Throat: No oropharyngeal exudate.  Eyes: EOM are normal. Pupils are equal, round, and reactive to light.  Neck: Neck supple.  Cardiovascular: Normal rate, regular rhythm and normal heart sounds.   Pulmonary/Chest: Effort normal and breath sounds normal.  Abdominal: Soft. Bowel sounds are normal. There is no tenderness. There is no rebound.  Musculoskeletal: He exhibits no edema.  Lymphadenopathy:    He has no cervical adenopathy.  Skin:  Mild folliculitis        Assessment & Plan:

## 2014-11-01 NOTE — Assessment & Plan Note (Signed)
Advised him to use acne soap, anti-bacterial soap.

## 2014-11-02 LAB — T-HELPER CELL (CD4) - (RCID CLINIC ONLY)
CD4 T CELL HELPER: 32 % — AB (ref 33–55)
CD4 T Cell Abs: 1040 /uL (ref 400–2700)

## 2014-11-02 LAB — RPR

## 2014-11-02 LAB — HIV-1 RNA QUANT-NO REFLEX-BLD
HIV 1 RNA QUANT: 21 {copies}/mL (ref <20)
HIV-1 RNA Quant, Log: 1.32 {Log} — ABNORMAL HIGH (ref <1.30)

## 2014-11-20 ENCOUNTER — Telehealth: Payer: Self-pay | Admitting: *Deleted

## 2014-11-20 NOTE — Telephone Encounter (Signed)
Patient notified his Rodney Chase has arrived at the clinic via [employer]. Patient will come 9/20 to pick it up.  He states he received a letter notifying him that his ADAP has been pended. Will forward to [facility] for advice. Andree Coss, RN

## 2015-01-11 ENCOUNTER — Other Ambulatory Visit: Payer: Self-pay | Admitting: *Deleted

## 2015-01-11 DIAGNOSIS — B20 Human immunodeficiency virus [HIV] disease: Secondary | ICD-10-CM

## 2015-01-11 MED ORDER — ABACAVIR-DOLUTEGRAVIR-LAMIVUD 600-50-300 MG PO TABS: 1.0000 | ORAL_TABLET | Freq: Every day | ORAL | Status: DC

## 2015-01-16 ENCOUNTER — Emergency Department (HOSPITAL_BASED_OUTPATIENT_CLINIC_OR_DEPARTMENT_OTHER): Payer: Medicare HMO

## 2015-01-16 ENCOUNTER — Emergency Department (HOSPITAL_BASED_OUTPATIENT_CLINIC_OR_DEPARTMENT_OTHER)
Admission: EM | Admit: 2015-01-16 | Discharge: 2015-01-17 | Disposition: A | Discharge status: Home / Self Care | Payer: Medicare HMO | Attending: Emergency Medicine | Admitting: Emergency Medicine

## 2015-01-16 ENCOUNTER — Encounter (HOSPITAL_BASED_OUTPATIENT_CLINIC_OR_DEPARTMENT_OTHER): Payer: Self-pay | Admitting: Emergency Medicine

## 2015-01-16 DIAGNOSIS — Z79899 Other long term (current) drug therapy: Secondary | ICD-10-CM | POA: Insufficient documentation

## 2015-01-16 DIAGNOSIS — E119 Type 2 diabetes mellitus without complications: Secondary | ICD-10-CM | POA: Insufficient documentation

## 2015-01-16 DIAGNOSIS — E785 Hyperlipidemia, unspecified: Secondary | ICD-10-CM | POA: Insufficient documentation

## 2015-01-16 DIAGNOSIS — Z791 Long term (current) use of non-steroidal anti-inflammatories (NSAID): Secondary | ICD-10-CM | POA: Diagnosis not present

## 2015-01-16 DIAGNOSIS — R609 Edema, unspecified: Secondary | ICD-10-CM | POA: Insufficient documentation

## 2015-01-16 DIAGNOSIS — I10 Essential (primary) hypertension: Secondary | ICD-10-CM | POA: Insufficient documentation

## 2015-01-16 DIAGNOSIS — E669 Obesity, unspecified: Secondary | ICD-10-CM | POA: Diagnosis not present

## 2015-01-16 DIAGNOSIS — R0602 Shortness of breath: Secondary | ICD-10-CM | POA: Insufficient documentation

## 2015-01-16 DIAGNOSIS — Z7984 Long term (current) use of oral hypoglycemic drugs: Secondary | ICD-10-CM | POA: Insufficient documentation

## 2015-01-16 NOTE — ED Provider Notes (Signed)
CSN: [id number]     Arrival date & time 01/16/15  2143 History  By signing my name below, I, Gwenyth Oberatherine Macek, attest that this documentation has been prepared under the direction and in the presence of Shon Batonourtney F Horton, MD.  Electronically Signed: Gwenyth Oberatherine Macek, ED Scribe. 01/16/2015. 12:04 AM.  Chief Complaint  Patient presents with  . Shortness of Breath   The history is provided by the patient. No language interpreter was used.    HPI Comments: Marnee SpringCory Grizzell is a 39 y.o. male with a history of DM, HTN and high cholesterol who presents to the Emergency Department complaining of intermittent episodes of moderate SOB and mild CP that started 2 days ago. Describes chest pain as tightness. Pt denies a trigger for symptoms and states onset has occurred with walking, using the restroom and cooking. Last episode was approximately 8 PM. He denies a family history of CAD less than 38 y.o. Pt also denies fever and cough.  Denies lower leg swelling or risk factors for PE. Currently is asymptomatic.  Past Medical History  Diagnosis Date  . Diabetes mellitus   . Hypertension   . Hyperlipidemia    History reviewed. No pertinent past surgical history. Family History  Problem Relation Age of Onset  . Cancer Mother     breast   Social History  Substance Use Topics  . Smoking status: Never Smoker   . Smokeless tobacco: Never Used  . Alcohol Use: No    Review of Systems  Constitutional: Negative for fever.  Respiratory: Positive for shortness of breath. Negative for cough.   Cardiovascular: Positive for chest pain.  Gastrointestinal: Negative for nausea, vomiting and abdominal pain.  All other systems reviewed and are negative.  Allergies  Review of patient's allergies indicates no known allergies.  Home Medications   Prior to Admission medications   Medication Sig Start Date End Date Taking? Authorizing Provider  benazepril (LOTENSIN) 20 MG tablet Take 20 mg by mouth daily.     Historical Provider, MD  ibuprofen (ADVIL,MOTRIN) 800 MG tablet Take 1 tablet (800 mg total) by mouth 3 (three) times daily. 12/20/11   Quita SkyeMichael Ghim, MD  metFORMIN (GLUCOPHAGE) 500 MG tablet Take 500 mg by mouth 2 (two) times daily with a meal.    Historical Provider, MD  oseltamivir (TAMIFLU) 75 MG capsule Take 1 capsule (75 mg total) by mouth every 12 (twelve) hours. 04/22/12   Lattie HawStephen A Beese, MD  pantoprazole (PROTONIX) 40 MG tablet Take 1 tablet (40 mg total) by mouth daily. 12/20/11   Quita SkyeMichael Ghim, MD  polyethylene glycol [facility](MIRALAX) packet Take 17 g by mouth daily. As needed until movement occurs 12/20/11   Quita SkyeMichael Ghim, MD  pravastatin (PRAVACHOL) 40 MG tablet Take 40 mg by mouth daily.    Historical Provider, MD   BP 131/86 mmHg  Pulse 99  Temp(Src) 98.2 F (36.8 C) (Oral)  Resp 18  Ht 5\' 7"  (1.702 m)  Wt 283 lb (128.368 kg)  BMI 44.31 kg/m2  SpO2 95% Physical Exam  Constitutional: He is oriented to person, place, and time. He appears well-developed and well-nourished.  Obese  HENT:  Head: Normocephalic and atraumatic.  Cardiovascular: Normal rate, regular rhythm and normal heart sounds.   No murmur heard. Pulmonary/Chest: Effort normal and breath sounds normal. No respiratory distress. He has no wheezes. He exhibits no tenderness.  Abdominal: Soft. Bowel sounds are normal. There is no tenderness. There is no rebound and no guarding.  Musculoskeletal: He exhibits edema.  Neurological: He is alert and oriented to person, place, and time.  Skin: Skin is warm and dry.  Psychiatric: He has a normal mood and affect.  Nursing note and vitals reviewed.   ED Course  Procedures  DIAGNOSTIC STUDIES: Oxygen Saturation is 100% on RA, normal by my interpretation.    COORDINATION OF CARE: 12:02 AM Discussed treatment plan with pt which includes lab work. Pt agreed to plan.  Labs Review Labs Reviewed  CBC WITH DIFFERENTIAL/PLATELET - Abnormal; Notable for the following:    MCV 76.0  (*)    Lymphs Abs 5.5 (*)    All other components within normal limits  BASIC METABOLIC PANEL - Abnormal; Notable for the following:    Glucose, Bld 182 (*)    All other components within normal limits  TROPONIN I  D-DIMER, QUANTITATIVE (NOT AT [facility])    Imaging Review Dg Chest 2 View  01/16/2015  CLINICAL DATA:  Shortness of breath 2 days with chest tightness. EXAM: CHEST  2 VIEW COMPARISON:  03/31/2013 FINDINGS: Lungs are adequately inflated without consolidation or effusion. Cardiomediastinal silhouette is within normal. Remaining bones and soft tissues within normal. IMPRESSION: No active cardiopulmonary disease. Electronically Signed   By: Elberta Fortis M.D.   On: 01/16/2015 22:02   I have personally reviewed and evaluated these images and lab results as part of my medical decision-making.   EKG Interpretation   Date/Time:  Wednesday January 17 2015 00:20:25 EST Ventricular Rate:  87 PR Interval:  144 QRS Duration: 86 QT Interval:  362 QTC Calculation: 435 R Axis:   77 Text Interpretation:  Normal sinus rhythm Normal ECG Confirmed by HORTON   MD, COURTNEY ([id number]) on 01/17/2015 12:55:28 AM      MDM   Final diagnoses:  Shortness of breath    Patient presents with shortness of breath. Reports episodes of shortness of breath in multiple settings including exertion and rest. Patient does have risk factors for ACS including hypertension, obesity, diabetes. EKG is normal without ischemic changes. Chest x-ray shows no evidence of pneumonia or pneumothorax. No pulmonary edema. Pulmonary exam is normal. Screening d-dimer sent and is negative. Patient ambulated and maintain pulse ox 95-99%. Patient's heart score is 3 for risk factors and a moderately suspicious story. Discussed with patient close follow-up with cardiology for evaluation for stress testing. Patient was agreeable.  Patient was given strict return precautions.  After history, exam, and medical workup I feel the patient  has been appropriately medically screened and is safe for discharge home. Pertinent diagnoses were discussed with the patient. Patient was given return precautions.  I personally performed the services described in this documentation, which was scribed in my presence. The recorded information has been reviewed and is accurate.    Shon Baton, MD 01/17/15 [phone]
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
## 2015-01-17 ENCOUNTER — Other Ambulatory Visit: Payer: Self-pay

## 2015-01-17 DIAGNOSIS — R0602 Shortness of breath: Secondary | ICD-10-CM | POA: Diagnosis not present

## 2015-01-17 LAB — CBC WITH DIFFERENTIAL/PLATELET
BASOS PCT: 0 %
Basophils Absolute: 0 10*3/uL (ref 0.0–0.1)
EOS ABS: 0.1 10*3/uL (ref 0.0–0.7)
EOS PCT: 1 %
HCT: 40.2 % (ref 39.0–52.0)
Hemoglobin: 14 g/dL (ref 13.0–17.0)
LYMPHS ABS: 5.5 10*3/uL — AB (ref 0.7–4.0)
Lymphocytes Relative: 52 %
MCH: 26.5 pg (ref 26.0–34.0)
MCHC: 34.8 g/dL (ref 30.0–36.0)
MCV: 76 fL — ABNORMAL LOW (ref 78.0–100.0)
MONOS PCT: 8 %
Monocytes Absolute: 0.8 10*3/uL (ref 0.1–1.0)
Neutro Abs: 4.1 10*3/uL (ref 1.7–7.7)
Neutrophils Relative %: 39 %
PLATELETS: 239 10*3/uL (ref 150–400)
RBC: 5.29 MIL/uL (ref 4.22–5.81)
RDW: 14 % (ref 11.5–15.5)
WBC: 10.5 10*3/uL (ref 4.0–10.5)

## 2015-01-17 LAB — BASIC METABOLIC PANEL
Anion gap: 6 (ref 5–15)
BUN: 16 mg/dL (ref 6–20)
CALCIUM: 9.3 mg/dL (ref 8.9–10.3)
CO2: 31 mmol/L (ref 22–32)
CREATININE: 0.86 mg/dL (ref 0.61–1.24)
Chloride: 101 mmol/L (ref 101–111)
Glucose, Bld: 182 mg/dL — ABNORMAL HIGH (ref 65–99)
Potassium: 3.6 mmol/L (ref 3.5–5.1)
SODIUM: 138 mmol/L (ref 135–145)

## 2015-01-17 LAB — D-DIMER, QUANTITATIVE: D-Dimer, Quant: <0.27 ug/mL-FEU (ref 0.00–0.50)

## 2015-01-17 LAB — TROPONIN I

## 2015-01-17 NOTE — ED Notes (Signed)
Walked patient in hallway O2 sats stayed 95-99%

## 2015-01-17 NOTE — Discharge Instructions (Signed)
You were seen today for shortness of breath. The exact cause of her shortness of breath is unknown; however, your workup is reassuring.  You do have risk factors for heart disease. He need to follow-up with cardiology for evaluation for stress testing. If you have any new or worsening symptoms you should be reevaluated immediately.  Shortness of Breath Shortness of breath means you have trouble breathing. It could also mean that you have a medical problem. You should get immediate medical care for shortness of breath. CAUSES   Not enough oxygen in the air such as with high altitudes or a smoke-filled room.  Certain lung diseases, infections, or problems.  Heart disease or conditions, such as angina or heart failure.  Low red blood cells (anemia).  Poor physical fitness, which can cause shortness of breath when you exercise.  Chest or back injuries or stiffness.  Being overweight.  Smoking.  Anxiety, which can make you feel like you are not getting enough air. DIAGNOSIS  Serious medical problems can often be found during your physical exam. Tests may also be done to determine why you are having shortness of breath. Tests may include:  Chest X-rays.  Lung function tests.  Blood tests.  An electrocardiogram (ECG).  An ambulatory electrocardiogram. An ambulatory ECG records your heartbeat patterns over a 24-hour period.  Exercise testing.  A transthoracic echocardiogram (TTE). During echocardiography, sound waves are used to evaluate how blood flows through your heart.  A transesophageal echocardiogram (TEE).  Imaging scans. Your health care provider may not be able to find a cause for your shortness of breath after your exam. In this case, it is important to have a follow-up exam with your health care provider as directed.  TREATMENT  Treatment for shortness of breath depends on the cause of your symptoms and can vary greatly. HOME CARE INSTRUCTIONS   Do not smoke.  Smoking is a common cause of shortness of breath. If you smoke, ask for help to quit.  Avoid being around chemicals or things that may bother your breathing, such as paint fumes and dust.  Rest as needed. Slowly resume your usual activities.  If medicines were prescribed, take them as directed for the full length of time directed. This includes oxygen and any inhaled medicines.  Keep all follow-up appointments as directed by your health care provider. SEEK MEDICAL CARE IF:   Your condition does not improve in the time expected.  You have a hard time doing your normal activities even with rest.  You have any new symptoms. SEEK IMMEDIATE MEDICAL CARE IF:   Your shortness of breath gets worse.  You feel light-headed, faint, or develop a cough not controlled with medicines.  You start coughing up blood.  You have pain with breathing.  You have chest pain or pain in your arms, shoulders, or abdomen.  You have a fever.  You are unable to walk up stairs or exercise the way you normally do. MAKE SURE YOU:  Understand these instructions.  Will watch your condition.  Will get help right away if you are not doing well or get worse.   This information is not intended to replace advice given to you by your health care provider. Make sure you discuss any questions you have with your health care provider.   Document Released: 11/12/2000 Document Revised: 02/22/2013 Document Reviewed: 05/05/2011 Elsevier Interactive Patient Education [employer].

## 2015-01-17 NOTE — ED Notes (Signed)
Patient placed on auto vitals q30.

## 2015-02-09 ENCOUNTER — Ambulatory Visit (INDEPENDENT_AMBULATORY_CARE_PROVIDER_SITE_OTHER): Payer: Medicare HMO | Admitting: Cardiovascular Disease

## 2015-02-09 ENCOUNTER — Encounter: Payer: Self-pay | Admitting: Cardiovascular Disease

## 2015-02-09 VITALS — BP 118/90 | HR 90 | Ht 67.0 in | Wt 286.1 lb

## 2015-02-09 DIAGNOSIS — G4733 Obstructive sleep apnea (adult) (pediatric): Secondary | ICD-10-CM | POA: Diagnosis not present

## 2015-02-09 DIAGNOSIS — E662 Morbid (severe) obesity with alveolar hypoventilation: Secondary | ICD-10-CM | POA: Diagnosis not present

## 2015-02-09 DIAGNOSIS — R0609 Other forms of dyspnea: Secondary | ICD-10-CM | POA: Diagnosis not present

## 2015-02-09 DIAGNOSIS — R0600 Dyspnea, unspecified: Secondary | ICD-10-CM | POA: Diagnosis not present

## 2015-02-09 NOTE — Progress Notes (Signed)
Cardiology Office Note   Date:  02/09/2015   ID:  Peter Woods, DOB [birth year], MRN [id number]  PCP:  No primary care provider on file.  Cardiologist:   Peter Woods, Peter PingPhilip J, MD   Chief Complaint  Patient presents with  . Shortness of Breath   Problem List 1. Dyspnea  2. Essential HTN 3. DM 4. Hyperlipidemia  5. Obstructive sleep apnea.    History of Present Illness: Peter Woods is a 39 y.o. male who presents for evaluation of his  Shortness of breath  Comes and goes.  Has DOE with everyday activities.   Regular exercise - on occasion . Does not work   Has OSA - does not wear his CPAP  - CPAP machine is not working well.    Primary is [facility] .    Past Medical History  Diagnosis Date  . Diabetes mellitus   . Hypertension   . Hyperlipidemia     No past surgical history on file.   Current Outpatient Prescriptions  Medication Sig Dispense Refill  . benazepril-hydrochlorthiazide (LOTENSIN HCT) 20-25 MG tablet Take 1 tablet by mouth daily.  3  . metFORMIN (GLUCOPHAGE) 1000 MG tablet Take 1,000 mg by mouth 2 (two) times daily.    Marland Kitchen. omeprazole (PRILOSEC) 40 MG capsule Take 40 mg by mouth daily.  3  . pravastatin (PRAVACHOL) 80 MG tablet Take 80 mg by mouth at bedtime.  1   No current facility-administered medications for this visit.    Allergies:   Review of patient's allergies indicates no known allergies.    Social History:  The patient  reports that he has never smoked. He has never used smokeless tobacco. He reports that he does not drink alcohol or use illicit drugs.   Family History:  The patient's family history includes Cancer in his mother.    ROS:  Please see the history of present illness.    Review of Systems: Constitutional:  denies fever, chills, diaphoresis, appetite change and fatigue.  HEENT: denies photophobia, eye pain, redness, hearing loss, ear pain, congestion, sore throat, rhinorrhea, sneezing,  neck pain, neck stiffness and tinnitus.  Respiratory: admits to SOB, DOE,    Cardiovascular: denies chest pain, palpitations and leg swelling.  Gastrointestinal: denies nausea, vomiting, abdominal pain, diarrhea, constipation, blood in stool.  Genitourinary: denies dysuria, urgency, frequency, hematuria, flank pain and difficulty urinating.  Musculoskeletal: denies  myalgias, back pain, joint swelling, arthralgias and gait problem.   Skin: denies pallor, rash and wound.  Neurological: denies dizziness, seizures, syncope, weakness, light-headedness, numbness and headaches.   Hematological: denies adenopathy, easy bruising, personal or family bleeding history.  Psychiatric/ Behavioral: denies suicidal ideation, mood changes, confusion, nervousness, sleep disturbance and agitation.       All other systems are reviewed and negative.    PHYSICAL EXAM: VS:  BP 118/90 mmHg  Pulse 90  Ht 5\' 7"  (1.702 m)  Wt 286 lb 1.9 oz (129.783 kg)  BMI 44.80 kg/m2  SpO2 92% , BMI Body mass index is 44.8 kg/(m^2). GEN: Well nourished, well developed, in no acute distress HEENT: normal Neck: no JVD, carotid bruits, or masses Cardiac: RRR; no murmurs, rubs, or gallops,no edema  Respiratory:  clear to auscultation bilaterally, normal work of breathing GI: soft, nontender, nondistended, + BS MS: no deformity or atrophy Skin: warm and dry, no rash Neuro:  Strength and sensation are intact Psych: normal   EKG:  EKG is not ordered today. The ekg  ordered 01/17/15  demonstrates NSR at 87.  No ST or T wave changes.    Recent Labs: 01/17/2015: BUN 16; Creatinine, Ser 0.86; Hemoglobin 14.0; Platelets 239; Potassium 3.6; Sodium 138    Lipid Panel No results found for: CHOL, TRIG, HDL, CHOLHDL, VLDL, LDLCALC, LDLDIRECT    Wt Readings from Last 3 Encounters:  02/09/15 286 lb 1.9 oz (129.783 kg)  01/16/15 283 lb (128.368 kg)  04/22/12 289 lb (131.09 kg)      Other studies Reviewed: Additional  studies/ records that were reviewed today include: . Review of the above records demonstrates:    ASSESSMENT AND PLAN:  1.  Shortness of breath: [name] presents for further evaluation of shortness breath. He denies any chest pain. At this point I doubt that he has significant cardiac disease. I've encouraged him to work on a better exercise, weight loss plan. He also has sleep apnea but does not use his CPAP. I've advised him to replace his CPAP machine. We will get an echo cardiogram. I'll see him back on an as-needed basis unless the echocardiogram reveals significant cardiac issues.  2. Essential hypertension: He's had hypertension that is currently well controlled. He will continue to follow-up with his medical doctor.  3. Obstructive sleep apnea:  He has OSA but his CPAP is not working properly. I've advised him to contact his medical doctor look into getting a referral to have a CPAP replaced. I've encouraged him to exercise on a regular basis in an effort to lose weight.   Current medicines are reviewed at length with the patient today.  The patient does not have concerns regarding medicines.  The following changes have been made:  no change  Labs/ tests ordered today include:  No orders of the defined types were placed in this encounter.     Disposition:   FU with me as needed.       Shakaya Bhullar, Peter Ping, MD  02/09/2015 10:47 AM    [facility] [address] [place], [place], [place]  [address] Phone: [phone]; Fax: [phone]   [facility]  [address] [place], [place]  [address] [phone]   Fax [phone]

## 2015-02-09 NOTE — Patient Instructions (Signed)
Medication Instructions:  Your physician recommends that you continue on your current medications as directed. Please refer to the Current Medication list given to you today.   Labwork: None Ordered   Testing/Procedures: Your physician has requested that you have an echocardiogram. Echocardiography is a painless test that uses sound waves to create images of your heart. It provides your doctor with information about the size and shape of your heart and how well your heart's chambers and valves are working. This procedure takes approximately one hour. There are no restrictions for this procedure.    Follow-Up: Your physician recommends that you schedule a follow-up appointment in: as needed with Dr. Nahser.    If you need a refill on your cardiac medications before your next appointment, please call your pharmacy.   Thank you for choosing [facility]! Suraj Ramdass, RN [phone]    

## 2015-02-28 ENCOUNTER — Ambulatory Visit (HOSPITAL_COMMUNITY): Payer: Medicare HMO

## 2015-03-09 ENCOUNTER — Other Ambulatory Visit: Payer: Self-pay

## 2015-03-09 ENCOUNTER — Ambulatory Visit (HOSPITAL_COMMUNITY): Payer: Medicare Other | Attending: Cardiovascular Disease

## 2015-03-09 DIAGNOSIS — G4733 Obstructive sleep apnea (adult) (pediatric): Secondary | ICD-10-CM | POA: Diagnosis not present

## 2015-03-09 DIAGNOSIS — R0600 Dyspnea, unspecified: Secondary | ICD-10-CM

## 2015-03-09 DIAGNOSIS — Z6841 Body Mass Index (BMI) 40.0 and over, adult: Secondary | ICD-10-CM | POA: Diagnosis not present

## 2015-03-22 ENCOUNTER — Ambulatory Visit: Payer: Self-pay

## 2015-04-05 ENCOUNTER — Ambulatory Visit: Payer: Self-pay

## 2015-04-05 ENCOUNTER — Other Ambulatory Visit (INDEPENDENT_AMBULATORY_CARE_PROVIDER_SITE_OTHER): Payer: Self-pay

## 2015-04-05 DIAGNOSIS — Z113 Encounter for screening for infections with a predominantly sexual mode of transmission: Secondary | ICD-10-CM

## 2015-04-05 DIAGNOSIS — B20 Human immunodeficiency virus [HIV] disease: Secondary | ICD-10-CM

## 2015-04-05 DIAGNOSIS — Z79899 Other long term (current) drug therapy: Secondary | ICD-10-CM

## 2015-04-05 LAB — COMPREHENSIVE METABOLIC PANEL
ALK PHOS: 48 U/L (ref 40–115)
ALT: 9 U/L (ref 9–46)
AST: 15 U/L (ref 10–40)
Albumin: 4.1 g/dL (ref 3.6–5.1)
BUN: 17 mg/dL (ref 7–25)
CALCIUM: 8.8 mg/dL (ref 8.6–10.3)
CHLORIDE: 107 mmol/L (ref 98–110)
CO2: 26 mmol/L (ref 20–31)
Creat: 1.14 mg/dL (ref 0.60–1.35)
GLUCOSE: 87 mg/dL (ref 65–99)
POTASSIUM: 4.2 mmol/L (ref 3.5–5.3)
Sodium: 142 mmol/L (ref 135–146)
Total Bilirubin: 0.4 mg/dL (ref 0.2–1.2)
Total Protein: 6.8 g/dL (ref 6.1–8.1)

## 2015-04-05 LAB — CBC
HEMATOCRIT: 43.5 % (ref 39.0–52.0)
HEMOGLOBIN: 13.9 g/dL (ref 13.0–17.0)
MCH: 27.8 pg (ref 26.0–34.0)
MCHC: 32 g/dL (ref 30.0–36.0)
MCV: 87 fL (ref 78.0–100.0)
MPV: 9.2 fL (ref 8.6–12.4)
Platelets: 205 10*3/uL (ref 150–400)
RBC: 5 MIL/uL (ref 4.22–5.81)
RDW: 15.8 % — AB (ref 11.5–15.5)
WBC: 6.2 10*3/uL (ref 4.0–10.5)

## 2015-04-05 LAB — LIPID PANEL
CHOL/HDL RATIO: 3.4 ratio (ref <=5.0)
CHOLESTEROL: 148 mg/dL (ref 125–200)
HDL: 44 mg/dL (ref >=40)
LDL Cholesterol: 88 mg/dL (ref <130)
Triglycerides: 80 mg/dL (ref <150)
VLDL: 16 mg/dL (ref <30)

## 2015-04-05 NOTE — Addendum Note (Signed)
Addended by: Mariea Clonts D on: 04/05/2015 05:13 PM   Modules accepted: Orders

## 2015-04-06 LAB — T-HELPER CELL (CD4) - (RCID CLINIC ONLY)
CD4 % Helper T Cell: 34 % (ref 33–55)
CD4 T CELL ABS: 1080 /uL (ref 400–2700)

## 2015-04-06 LAB — RPR

## 2015-04-09 LAB — HIV-1 RNA QUANT-NO REFLEX-BLD
HIV 1 RNA QUANT: 41 {copies}/mL — AB (ref <20)
HIV-1 RNA QUANT, LOG: 1.61 {Log_copies}/mL — AB (ref <1.30)

## 2015-04-09 LAB — URINE CYTOLOGY ANCILLARY ONLY
CHLAMYDIA, DNA PROBE: NEGATIVE
NEISSERIA GONORRHEA: NEGATIVE

## 2015-04-18 ENCOUNTER — Ambulatory Visit: Payer: Self-pay | Admitting: Infectious Diseases

## 2015-05-29 ENCOUNTER — Encounter: Payer: Self-pay | Admitting: Infectious Diseases

## 2015-06-28 ENCOUNTER — Telehealth: Payer: Self-pay | Admitting: *Deleted

## 2015-06-28 ENCOUNTER — Emergency Department (HOSPITAL_COMMUNITY): Payer: Self-pay

## 2015-06-28 ENCOUNTER — Encounter (HOSPITAL_COMMUNITY): Payer: Self-pay | Admitting: Emergency Medicine

## 2015-06-28 ENCOUNTER — Emergency Department (HOSPITAL_COMMUNITY)
Admission: EM | Admit: 2015-06-28 | Discharge: 2015-06-28 | Disposition: A | Discharge status: Home / Self Care | Payer: Self-pay | Attending: Emergency Medicine | Admitting: Emergency Medicine

## 2015-06-28 DIAGNOSIS — R002 Palpitations: Secondary | ICD-10-CM | POA: Insufficient documentation

## 2015-06-28 DIAGNOSIS — R5383 Other fatigue: Secondary | ICD-10-CM | POA: Insufficient documentation

## 2015-06-28 DIAGNOSIS — M79602 Pain in left arm: Secondary | ICD-10-CM | POA: Insufficient documentation

## 2015-06-28 DIAGNOSIS — F17210 Nicotine dependence, cigarettes, uncomplicated: Secondary | ICD-10-CM | POA: Insufficient documentation

## 2015-06-28 LAB — BASIC METABOLIC PANEL
Anion gap: 9 (ref 5–15)
BUN: 8 mg/dL (ref 6–20)
CO2: 26 mmol/L (ref 22–32)
Calcium: 9.4 mg/dL (ref 8.9–10.3)
Chloride: 106 mmol/L (ref 101–111)
Creatinine, Ser: 0.97 mg/dL (ref 0.61–1.24)
GFR calc Af Amer: >60 mL/min (ref >60)
GFR calc non Af Amer: >60 mL/min (ref >60)
GLUCOSE: 102 mg/dL — AB (ref 65–99)
Potassium: 3.9 mmol/L (ref 3.5–5.1)
Sodium: 141 mmol/L (ref 135–145)

## 2015-06-28 LAB — CBC
HCT: 42.3 % (ref 39.0–52.0)
HEMOGLOBIN: 14.1 g/dL (ref 13.0–17.0)
MCH: 28.6 pg (ref 26.0–34.0)
MCHC: 33.3 g/dL (ref 30.0–36.0)
MCV: 85.8 fL (ref 78.0–100.0)
Platelets: 207 10*3/uL (ref 150–400)
RBC: 4.93 MIL/uL (ref 4.22–5.81)
RDW: 14.4 % (ref 11.5–15.5)
WBC: 7.6 10*3/uL (ref 4.0–10.5)

## 2015-06-28 LAB — I-STAT TROPONIN, ED: TROPONIN I, POC: 0 ng/mL (ref 0.00–0.08)

## 2015-06-28 NOTE — ED Notes (Signed)
Pt reports feeling increased fatigue and palpitations x 1 hour with left arm pain. Pt alert x4. NAD at this time.

## 2015-06-28 NOTE — ED Notes (Signed)
Pt called for room, no answer. Pt moved OTF 

## 2015-06-28 NOTE — ED Notes (Signed)
Pt called for room, no answer.
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
## 2015-07-31 ENCOUNTER — Ambulatory Visit: Payer: Self-pay | Admitting: Infectious Diseases

## 2015-07-31 ENCOUNTER — Telehealth: Payer: Self-pay | Admitting: *Deleted

## 2015-07-31 NOTE — Telephone Encounter (Signed)
Unable to contact pt, no answer. 

## 2015-08-03 ENCOUNTER — Telehealth: Payer: Self-pay

## 2015-08-03 ENCOUNTER — Other Ambulatory Visit: Payer: Self-pay | Admitting: Infectious Diseases

## 2015-08-03 NOTE — Telephone Encounter (Signed)
Pharmacist Thurston [facility] to verify patient's current regimen due to patient's requesting a refill of Triumeq. Notified Thurston Holenne patient has not been seen in office since August of last year and missed appointment scheduled on May 30th. Thurston Holenne stated their pharmacy has not sent out medication to patient since December of 2016. Refill request denied. Patient told to contact office for another appointment. RN Supervisor notified of refill request. Rejeana Brockandace Murray, LPN

## 2015-08-08 ENCOUNTER — Telehealth: Payer: Self-pay | Admitting: *Deleted

## 2015-08-08 DIAGNOSIS — B20 Human immunodeficiency virus [HIV] disease: Secondary | ICD-10-CM

## 2015-08-08 MED ORDER — ABACAVIR-DOLUTEGRAVIR-LAMIVUD 600-50-300 MG PO TABS: 1.0000 | ORAL_TABLET | Freq: Every day | ORAL | Status: DC

## 2015-08-08 NOTE — Telephone Encounter (Signed)
Pt needed f/u appt.  Made appt for 08/13/15 with Dr. Ninetta LightsHatcher.  Reminded the pt that he needed to keep this appt due to Dr. Moshe CiproHatcher's next appointment is not until August, 2017.  Pt stated that he would keep the appt and ask his new employer to give him the time off.

## 2015-08-13 ENCOUNTER — Ambulatory Visit: Payer: Self-pay | Admitting: Infectious Diseases

## 2015-08-15 ENCOUNTER — Ambulatory Visit: Payer: Self-pay | Admitting: Infectious Diseases

## 2015-09-13 ENCOUNTER — Other Ambulatory Visit: Payer: Self-pay

## 2015-09-26 ENCOUNTER — Ambulatory Visit: Payer: Self-pay | Admitting: Infectious Diseases

## 2015-10-16 ENCOUNTER — Telehealth: Payer: Self-pay | Admitting: *Deleted

## 2015-10-16 ENCOUNTER — Ambulatory Visit: Payer: Self-pay

## 2015-10-16 ENCOUNTER — Other Ambulatory Visit: Payer: Self-pay

## 2015-10-16 DIAGNOSIS — B20 Human immunodeficiency virus [HIV] disease: Secondary | ICD-10-CM

## 2015-10-16 DIAGNOSIS — Z79899 Other long term (current) drug therapy: Secondary | ICD-10-CM

## 2015-10-16 DIAGNOSIS — Z113 Encounter for screening for infections with a predominantly sexual mode of transmission: Secondary | ICD-10-CM

## 2015-10-16 NOTE — Telephone Encounter (Signed)
Patient called, thinking he was scheduled [facility].  He in fact missed his last appointment with Dr. Ninetta LightsHatcher 7/26. Patient last seen 11/01/14.   Patient has today off work, would like to come in at least for labs and RW/ADAP renewal - those appoinments made. He will bring his financial information to the counselor.   He is scheduled with Dr Ninetta LightsHatcher at his first available - 10/18. Patient will call to see if there are any cancellations or openings between now and then. He will contact [facility] for refills.  Andree CossHowell, Zionna Homewood M, RN

## 2015-10-17 ENCOUNTER — Ambulatory Visit: Payer: Self-pay

## 2015-10-24 ENCOUNTER — Encounter: Payer: Self-pay | Admitting: Infectious Diseases

## 2015-11-29 ENCOUNTER — Other Ambulatory Visit: Payer: Self-pay

## 2015-12-19 ENCOUNTER — Ambulatory Visit: Payer: Self-pay | Admitting: Infectious Diseases

## 2015-12-24 ENCOUNTER — Telehealth: Payer: Self-pay | Admitting: *Deleted

## 2015-12-24 NOTE — Telephone Encounter (Signed)
Patient called, asked for another appointment. RN made him aware he has no-showed 7 appointment, has not been seen since 8/16. Pt promised to keep lab/appointments made today. Andree CossHowell, Cledis Sohn M, RN

## 2015-12-27 ENCOUNTER — Telehealth: Payer: Self-pay | Admitting: *Deleted

## 2015-12-27 NOTE — Telephone Encounter (Signed)
Patient called stating he needed to get in to see Dr. Ninetta LightsHatcher ASAP for a physical for employment and a TB test. Explained that he is already scheduled for Dr. Moshe CiproHatcher's first available and that he can get a TB test at the health dept or occupational health. Advised him that he has had several no shows with Dr. Ninetta LightsHatcher and he does have a lab appt tomorrow and he was encouraged to keep these upcoming appts.

## 2015-12-28 ENCOUNTER — Other Ambulatory Visit (INDEPENDENT_AMBULATORY_CARE_PROVIDER_SITE_OTHER): Payer: Self-pay

## 2015-12-28 DIAGNOSIS — Z113 Encounter for screening for infections with a predominantly sexual mode of transmission: Secondary | ICD-10-CM

## 2015-12-28 DIAGNOSIS — B20 Human immunodeficiency virus [HIV] disease: Secondary | ICD-10-CM

## 2015-12-28 DIAGNOSIS — Z79899 Other long term (current) drug therapy: Secondary | ICD-10-CM

## 2015-12-28 LAB — LIPID PANEL
CHOLESTEROL: 135 mg/dL (ref 125–200)
HDL: 37 mg/dL — AB (ref >=40)
LDL Cholesterol: 83 mg/dL (ref <130)
TRIGLYCERIDES: 75 mg/dL (ref <150)
Total CHOL/HDL Ratio: 3.6 Ratio (ref <=5.0)
VLDL: 15 mg/dL (ref <30)

## 2015-12-28 LAB — COMPLETE METABOLIC PANEL WITH GFR
ALBUMIN: 4 g/dL (ref 3.6–5.1)
ALK PHOS: 49 U/L (ref 40–115)
ALT: 10 U/L (ref 9–46)
AST: 17 U/L (ref 10–40)
BILIRUBIN TOTAL: 0.4 mg/dL (ref 0.2–1.2)
BUN: 15 mg/dL (ref 7–25)
CALCIUM: 9.2 mg/dL (ref 8.6–10.3)
CO2: 26 mmol/L (ref 20–31)
Chloride: 106 mmol/L (ref 98–110)
Creat: 0.98 mg/dL (ref 0.60–1.35)
Glucose, Bld: 84 mg/dL (ref 65–99)
Potassium: 4.3 mmol/L (ref 3.5–5.3)
Sodium: 141 mmol/L (ref 135–146)
TOTAL PROTEIN: 6.9 g/dL (ref 6.1–8.1)

## 2015-12-28 LAB — CBC WITH DIFFERENTIAL/PLATELET
BASOS ABS: 0 {cells}/uL (ref 0–200)
Basophils Relative: 0 %
EOS ABS: 97 {cells}/uL (ref 15–500)
Eosinophils Relative: 1 %
HCT: 45.8 % (ref 38.5–50.0)
HEMOGLOBIN: 15 g/dL (ref 13.2–17.1)
LYMPHS ABS: 3977 {cells}/uL — AB (ref 850–3900)
Lymphocytes Relative: 41 %
MCH: 28.5 pg (ref 27.0–33.0)
MCHC: 32.8 g/dL (ref 32.0–36.0)
MCV: 87.1 fL (ref 80.0–100.0)
MPV: 9.1 fL (ref 7.5–12.5)
Monocytes Absolute: 679 cells/uL (ref 200–950)
Monocytes Relative: 7 %
NEUTROS ABS: 4947 {cells}/uL (ref 1500–7800)
NEUTROS PCT: 51 %
Platelets: 230 10*3/uL (ref 140–400)
RBC: 5.26 MIL/uL (ref 4.20–5.80)
RDW: 15.4 % — ABNORMAL HIGH (ref 11.0–15.0)
WBC: 9.7 10*3/uL (ref 3.8–10.8)

## 2015-12-28 LAB — T-HELPER CELL (CD4) - (RCID CLINIC ONLY)
CD4 % Helper T Cell: 31 % — ABNORMAL LOW (ref 33–55)
CD4 T CELL ABS: 1190 /uL (ref 400–2700)

## 2015-12-29 LAB — RPR

## 2015-12-31 LAB — HIV-1 RNA QUANT-NO REFLEX-BLD
HIV 1 RNA Quant: <20 copies/mL (ref <20)
HIV-1 RNA Quant, Log: <1.3 Log copies/mL (ref <1.30)

## 2016-01-24 ENCOUNTER — Other Ambulatory Visit: Payer: Self-pay | Admitting: Infectious Diseases

## 2016-01-24 DIAGNOSIS — B20 Human immunodeficiency virus [HIV] disease: Secondary | ICD-10-CM

## 2016-01-29 ENCOUNTER — Ambulatory Visit: Payer: Self-pay | Admitting: Infectious Diseases

## 2016-01-31 ENCOUNTER — Ambulatory Visit (INDEPENDENT_AMBULATORY_CARE_PROVIDER_SITE_OTHER): Payer: Self-pay | Admitting: Infectious Diseases

## 2016-01-31 ENCOUNTER — Encounter: Payer: Self-pay | Admitting: Infectious Diseases

## 2016-01-31 VITALS — BP 121/80 | HR 80 | Temp 98.4°F | Ht 62.0 in | Wt 123.0 lb

## 2016-01-31 DIAGNOSIS — Z79899 Other long term (current) drug therapy: Secondary | ICD-10-CM

## 2016-01-31 DIAGNOSIS — K089 Disorder of teeth and supporting structures, unspecified: Secondary | ICD-10-CM

## 2016-01-31 DIAGNOSIS — Z113 Encounter for screening for infections with a predominantly sexual mode of transmission: Secondary | ICD-10-CM

## 2016-01-31 DIAGNOSIS — L732 Hidradenitis suppurativa: Secondary | ICD-10-CM

## 2016-01-31 DIAGNOSIS — B20 Human immunodeficiency virus [HIV] disease: Secondary | ICD-10-CM

## 2016-01-31 MED ORDER — CLINDAMYCIN PHOSPHATE 1 % EX GEL: Freq: Two times a day (BID) | CUTANEOUS | 0 refills | Status: DC

## 2016-01-31 NOTE — Assessment & Plan Note (Signed)
Fairly mild.  Will give him topical clinda.

## 2016-01-31 NOTE — Assessment & Plan Note (Signed)
Will cont triumeq.  Check gc/chalmydia today Given condoms.  Given copy of his labs Refuses flu shot.  Will see him back in 6 months with labs prior.

## 2016-01-31 NOTE — Progress Notes (Signed)
   Subjective:    Patient ID: Junius RoadsCary Peffley, male    DOB: October 31, 1975, 40 y.o.   MRN: [id number]  HPI 40 yo M with hx of anxiety, HIV+ (dx 8-07), started on complera 01-2012. Had Naive genotype 08-30-08. INI naive previously.  Was changed to Triumeq on 2015. Previously seen in ED for groin abscess. Also previously incarcerated. (08-03-14 to 09-12-14).  Has been feeling well, Denis missed meds.  Thinks he has carpal tunnel.  [facility]. His BMI does not qualify for ensure from [facility].   HIV 1 RNA Quant (copies/mL)  Date Value  12/28/2015 <20  04/05/2015 41 (H)  11/01/2014 21   CD4 T Cell Abs (/uL)  Date Value  12/28/2015 1,190  04/05/2015 1,080  11/01/2014 1,040    Review of Systems  Constitutional: Negative for appetite change and unexpected weight change.  Gastrointestinal: Negative for constipation and diarrhea.  Genitourinary: Negative for difficulty urinating.  has variable appetite.  Has had lesions in groin again.      Objective:   Physical Exam  Constitutional: He appears well-developed and well-nourished.  HENT:  Mouth/Throat: No oropharyngeal exudate.  Eyes: EOM are normal. Pupils are equal, round, and reactive to light.  Neck: Neck supple.  Cardiovascular: Normal rate, regular rhythm and normal heart sounds.   Pulmonary/Chest: Effort normal and breath sounds normal.  Abdominal: Soft. Bowel sounds are normal. There is no tenderness. There is no rebound.  Genitourinary:     Musculoskeletal: He exhibits no edema.  Lymphadenopathy:    He has no cervical adenopathy.      Assessment & Plan:

## 2016-01-31 NOTE — Assessment & Plan Note (Signed)
He will call Rodney Chase for f/u appt.

## 2016-02-01 LAB — URINE CYTOLOGY ANCILLARY ONLY
Chlamydia: NEGATIVE
Neisseria Gonorrhea: NEGATIVE

## 2016-03-11 ENCOUNTER — Emergency Department (HOSPITAL_BASED_OUTPATIENT_CLINIC_OR_DEPARTMENT_OTHER)
Admission: EM | Admit: 2016-03-11 | Discharge: 2016-03-11 | Disposition: A | Discharge status: Home / Self Care | Payer: No Typology Code available for payment source | Attending: Emergency Medicine | Admitting: Emergency Medicine

## 2016-03-11 ENCOUNTER — Encounter (HOSPITAL_BASED_OUTPATIENT_CLINIC_OR_DEPARTMENT_OTHER): Payer: Self-pay | Admitting: *Deleted

## 2016-03-11 ENCOUNTER — Emergency Department (HOSPITAL_BASED_OUTPATIENT_CLINIC_OR_DEPARTMENT_OTHER): Payer: No Typology Code available for payment source

## 2016-03-11 DIAGNOSIS — Y92410 Unspecified street and highway as the place of occurrence of the external cause: Secondary | ICD-10-CM | POA: Insufficient documentation

## 2016-03-11 DIAGNOSIS — Y999 Unspecified external cause status: Secondary | ICD-10-CM | POA: Diagnosis not present

## 2016-03-11 DIAGNOSIS — S199XXA Unspecified injury of neck, initial encounter: Secondary | ICD-10-CM | POA: Diagnosis present

## 2016-03-11 DIAGNOSIS — Z21 Asymptomatic human immunodeficiency virus [HIV] infection status: Secondary | ICD-10-CM | POA: Insufficient documentation

## 2016-03-11 DIAGNOSIS — F17210 Nicotine dependence, cigarettes, uncomplicated: Secondary | ICD-10-CM | POA: Diagnosis not present

## 2016-03-11 DIAGNOSIS — Y9389 Activity, other specified: Secondary | ICD-10-CM | POA: Diagnosis not present

## 2016-03-11 DIAGNOSIS — M542 Cervicalgia: Secondary | ICD-10-CM

## 2016-03-11 DIAGNOSIS — I10 Essential (primary) hypertension: Secondary | ICD-10-CM | POA: Insufficient documentation

## 2016-03-11 MED ORDER — METHOCARBAMOL 500 MG PO TABS: 500.0000 mg | ORAL_TABLET | Freq: Two times a day (BID) | ORAL | 0 refills | Status: DC | PRN

## 2016-03-11 MED ORDER — NAPROXEN 250 MG PO TABS: 250.0000 mg | ORAL_TABLET | Freq: Two times a day (BID) | ORAL | 0 refills | Status: DC

## 2016-03-11 NOTE — ED Notes (Signed)
ED Provider at bedside. 
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
## 2016-04-01 ENCOUNTER — Ambulatory Visit: Payer: Self-pay

## 2016-05-02 ENCOUNTER — Ambulatory Visit: Payer: Self-pay

## 2016-05-04 ENCOUNTER — Encounter: Payer: Self-pay | Admitting: Emergency Medicine

## 2016-05-04 ENCOUNTER — Emergency Department (INDEPENDENT_AMBULATORY_CARE_PROVIDER_SITE_OTHER)
Admission: EM | Admit: 2016-05-04 | Discharge: 2016-05-04 | Disposition: A | Discharge status: Home / Self Care | Payer: Medicare Other | Source: Home / Self Care | Attending: Family Medicine | Admitting: Family Medicine

## 2016-05-04 DIAGNOSIS — S0501XA Injury of conjunctiva and corneal abrasion without foreign body, right eye, initial encounter: Secondary | ICD-10-CM

## 2016-05-04 MED ORDER — SULFACETAMIDE SODIUM 10 % OP SOLN: 1.0000 [drp] | OPHTHALMIC | 0 refills | Status: AC

## 2016-05-04 MED ORDER — KETOROLAC TROMETHAMINE 0.5 % OP SOLN: 1.0000 [drp] | Freq: Four times a day (QID) | OPHTHALMIC | 0 refills | Status: AC

## 2016-05-04 NOTE — Discharge Instructions (Signed)
Wear sun glasses when outdoors.  May apply a cool compress several times daily. If symptoms become significantly worse during the night or over the weekend, proceed to the local emergency room.

## 2016-05-04 NOTE — ED Triage Notes (Signed)
Pt states he hit his right eye with his hand last week. C/o getting more red and painful. Denies itching.

## 2016-05-04 NOTE — ED Provider Notes (Signed)
[facility]    CSN: [id number] Arrival date & time: 05/04/16  1321     History   Chief Complaint Chief Complaint  Patient presents with  . Eye Pain    HPI Peter Woods is a 41 y.o. male.   Patient states that he accidentally his his right eye with his hand last week.  He has developed increasing light sensitivity and discomfort in his right eye during the past 2 days, but no pain with eye movement.  He has had some improvement with lubricating eye drops.   The history is provided by the patient.  Eye Problem  Location:  Right eye Quality:  Aching Severity:  Mild Onset quality:  Sudden Duration:  2 days Timing:  Constant Progression:  Worsening Chronicity:  New Context: direct trauma   Relieved by:  Nothing Worsened by:  Bright light Ineffective treatments: lubricating eye drops. Associated symptoms: blurred vision, crusting, discharge, photophobia, redness and tearing   Associated symptoms: no decreased vision, no double vision, no headaches, no itching, no scotomas and no swelling   Risk factors: previous injury to eye     Past Medical History:  Diagnosis Date  . Diabetes mellitus   . Hyperlipidemia   . Hypertension     Patient Active Problem List   Diagnosis Date Noted  . DOE (dyspnea on exertion) 02/09/2015  . OSA (obstructive sleep apnea) 02/09/2015  . Morbid obesity (HCC) 02/09/2015    History reviewed. No pertinent surgical history.     Home Medications    Prior to Admission medications   Medication Sig Start Date End Date Taking? Authorizing Provider  benazepril-hydrochlorthiazide (LOTENSIN HCT) 20-25 MG tablet Take 1 tablet by mouth daily. 11/08/14   Historical Provider, MD  ketorolac (ACULAR) 0.5 % ophthalmic solution Place 1 drop into the right eye 4 (four) times daily. 05/04/16   Lattie Haw, MD  metFORMIN (GLUCOPHAGE) 1000 MG tablet Take 1,000 mg by mouth 2 (two) times daily.    Historical Provider, MD  omeprazole (PRILOSEC) 40  MG capsule Take 40 mg by mouth daily. 11/30/14   Historical Provider, MD  pravastatin (PRAVACHOL) 80 MG tablet Take 80 mg by mouth at bedtime. 12/29/14   Historical Provider, MD  sulfacetamide (BLEPH-10) 10 % ophthalmic solution Place 1-2 drops into the right eye every 3 (three) hours. 05/04/16   Lattie Haw, MD    Family History Family History  Problem Relation Age of Onset  . Cancer Mother     breast    Social History Social History  Substance Use Topics  . Smoking status: Never Smoker  . Smokeless tobacco: Never Used  . Alcohol use No     Allergies   Patient has no known allergies.   Review of Systems Review of Systems  Eyes: Positive for blurred vision, photophobia, discharge and redness. Negative for double vision and itching.  Neurological: Negative for headaches.  All other systems reviewed and are negative.    Physical Exam Triage Vital Signs ED Triage Vitals  Enc Vitals Group     BP 05/04/16 1425 121/88     Pulse Rate 05/04/16 1425 96     Resp --      Temp 05/04/16 1425 98.6 F (37 C)     Temp Source 05/04/16 1425 Oral     SpO2 05/04/16 1425 94 %     Weight 05/04/16 1425 297 lb (134.7 kg)     Height --      Head Circumference --  Peak Flow --      Pain Score 05/04/16 1427 3     Pain Loc --      Pain Edu? --      Excl. in GC? --    No data found.   Updated Vital Signs BP 121/88 (BP Location: Right Arm)   Pulse 96   Temp 98.6 F (37 C) (Oral)   Wt 297 lb (134.7 kg)   SpO2 94%   BMI 46.52 kg/m   Visual Acuity Right Eye Distance:   Left Eye Distance:   Bilateral Distance:    Right Eye Near:   Left Eye Near:    Bilateral Near:     Physical Exam  Constitutional: He appears well-developed and well-nourished. No distress.  HENT:  Head: Atraumatic.  Right Ear: External ear normal.  Left Ear: External ear normal.  Nose: Nose normal.  Mouth/Throat: Oropharynx is clear and moist.  Eyes: EOM and lids are normal. Pupils are equal,  round, and reactive to light. Lids are everted and swept, no foreign bodies found. Right eye exhibits no chemosis and no discharge. Right conjunctiva is not injected.    Fluorescein reveals two small 2mm long abrasions central cornea as noted on diagram.   Neck: Neck supple.  Lymphadenopathy:    He has no cervical adenopathy.  Nursing note and vitals reviewed.    UC Treatments / Results  Labs (all labs ordered are listed, but only abnormal results are displayed) Labs Reviewed - No data to display  EKG  EKG Interpretation None       Radiology No results found.  Procedures Procedures (including critical care time)  Medications Ordered in UC Medications - No data to display   Initial Impression / Assessment and Plan / UC Course  I have reviewed the triage vital signs and the nursing notes.  Pertinent labs & imaging results that were available during my care of the patient were reviewed by me and considered in my medical decision making (see chart for details).        Final Clinical Impressions(s) / UC Diagnoses   Final diagnoses:  Right corneal abrasion, initial encounter    New Prescriptions New Prescriptions   KETOROLAC (ACULAR) 0.5 % OPHTHALMIC SOLUTION    Place 1 drop into the right eye 4 (four) times daily.   SULFACETAMIDE (BLEPH-10) 10 % OPHTHALMIC SOLUTION    Place 1-2 drops into the right eye every 3 (three) hours.     Lattie HawStephen A Sharona Rovner, MD 05/06/16 [phone]

## 2016-05-19 ENCOUNTER — Ambulatory Visit: Payer: Self-pay

## 2016-05-20 ENCOUNTER — Ambulatory Visit: Payer: Self-pay

## 2016-05-22 ENCOUNTER — Other Ambulatory Visit: Payer: Self-pay | Admitting: Infectious Diseases

## 2016-05-22 DIAGNOSIS — B20 Human immunodeficiency virus [HIV] disease: Secondary | ICD-10-CM

## 2016-05-29 ENCOUNTER — Encounter: Payer: Self-pay | Admitting: Infectious Diseases

## 2016-05-29 ENCOUNTER — Telehealth: Payer: Self-pay | Admitting: *Deleted

## 2016-05-29 NOTE — Telephone Encounter (Signed)
Letter in "letters" section of chart thanks
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
## 2016-05-29 NOTE — Telephone Encounter (Signed)
Patient notified, placed upfront for pick up on Monday 06/02/16. Rodney Chase

## 2016-06-04 ENCOUNTER — Ambulatory Visit: Payer: Self-pay

## 2016-06-09 ENCOUNTER — Ambulatory Visit (HOSPITAL_COMMUNITY)
Admission: EM | Admit: 2016-06-09 | Discharge: 2016-06-09 | Disposition: A | Discharge status: Home / Self Care | Payer: Self-pay | Attending: Internal Medicine | Admitting: Internal Medicine

## 2016-06-09 ENCOUNTER — Encounter (HOSPITAL_COMMUNITY): Payer: Self-pay | Admitting: Emergency Medicine

## 2016-06-09 DIAGNOSIS — F419 Anxiety disorder, unspecified: Secondary | ICD-10-CM

## 2016-06-09 MED ORDER — HYDROXYZINE HCL 25 MG PO TABS: 25.0000 mg | ORAL_TABLET | Freq: Four times a day (QID) | ORAL | 0 refills | Status: DC

## 2016-06-09 NOTE — ED Triage Notes (Signed)
The patient presented to the [facility] with hypertension. The patient stated that he has not been on HTN medication for several years.

## 2016-06-09 NOTE — ED Provider Notes (Signed)
CSN: [id number]     Arrival date & time 06/09/16  1706 History   First MD Initiated Contact with Patient 06/09/16 1758     Chief Complaint  Patient presents with  . Hypertension   (Consider location/radiation/quality/duration/timing/severity/associated sxs/prior Treatment) Patient states he has been experiencing some anxiety.  He states he used to take HTN medicine and was concerned about his BP causing his anxiety.   The history is provided by the patient.  Anxiety  This is a new problem. The current episode started less than 1 hour ago. The problem occurs constantly. The problem has not changed since onset.Nothing aggravates the symptoms. Nothing relieves the symptoms.    Past Medical History:  Diagnosis Date  . HIV (human immunodeficiency virus infection) (HCC)    History reviewed. No pertinent surgical history. Family History  Problem Relation Age of Onset  . Diabetes Daughter   . Cancer Mother     breast   Social History  Substance Use Topics  . Smoking status: Current Every Day Smoker    Packs/day: 0.20    Types: Cigarettes  . Smokeless tobacco: Never Used  . Alcohol use No    Review of Systems  Constitutional: Negative.   HENT: Negative.   Eyes: Negative.   Respiratory: Negative.   Cardiovascular: Negative.   Gastrointestinal: Negative.   Endocrine: Negative.   Genitourinary: Negative.   Musculoskeletal: Negative.   Allergic/Immunologic: Negative.   Neurological: Negative.   Hematological: Negative.   Psychiatric/Behavioral: Positive for agitation.    Allergies  Penicillins  Home Medications   Prior to Admission medications   Medication Sig Start Date End Date Taking? Authorizing Provider  TRIUMEQ 600-50-300 MG tablet TAKE 1 TABLET BY MOUTH DAILY 05/22/16  Yes Ginnie Smart, MD  hydrOXYzine (ATARAX/VISTARIL) 25 MG tablet Take 1 tablet (25 mg total) by mouth every 6 (six) hours. 06/09/16   Deatra Canter, FNP   Meds Ordered and Administered this  Visit  Medications - No data to display  BP 118/62 (BP Location: Right Arm)   Pulse 99   Temp 98.2 F (36.8 C) (Oral)   Resp 18   SpO2 100%  No data found.   Physical Exam  Constitutional: He is oriented to person, place, and time. He appears well-developed and well-nourished.  HENT:  Head: Normocephalic and atraumatic.  Eyes: Conjunctivae and EOM are normal. Pupils are equal, round, and reactive to light.  Neck: Normal range of motion. Neck supple.  Cardiovascular: Normal rate, regular rhythm and normal heart sounds.   Pulmonary/Chest: Effort normal and breath sounds normal.  Abdominal: Soft. Bowel sounds are normal.  Musculoskeletal: Normal range of motion.  Neurological: He is alert and oriented to person, place, and time.  Nursing note and vitals reviewed.   Urgent Care Course     Procedures (including critical care time)  Labs Review Labs Reviewed - No data to display  Imaging Review No results found.   Visual Acuity Review  Right Eye Distance:   Left Eye Distance:   Bilateral Distance:    Right Eye Near:   Left Eye Near:    Bilateral Near:         MDM   1. Anxiety    Hydroxyzine  one po tid prn #30  Follow up with PCP prn      Deatra Canter, FNP 06/09/16 1817    Deatra Canter, FNP 06/09/16 [phone]

## 2016-06-11 ENCOUNTER — Encounter: Payer: Self-pay | Admitting: Infectious Diseases

## 2016-06-16 ENCOUNTER — Ambulatory Visit: Payer: Self-pay

## 2016-07-29 ENCOUNTER — Ambulatory Visit: Payer: Self-pay | Admitting: Infectious Diseases

## 2016-09-01 ENCOUNTER — Ambulatory Visit: Payer: Self-pay | Admitting: Infectious Diseases

## 2016-09-20 ENCOUNTER — Other Ambulatory Visit: Payer: Self-pay | Admitting: Infectious Diseases

## 2016-09-20 DIAGNOSIS — B20 Human immunodeficiency virus [HIV] disease: Secondary | ICD-10-CM

## 2016-09-27 ENCOUNTER — Other Ambulatory Visit: Payer: Self-pay | Admitting: Infectious Diseases

## 2016-09-27 DIAGNOSIS — B20 Human immunodeficiency virus [HIV] disease: Secondary | ICD-10-CM

## 2016-10-25 ENCOUNTER — Other Ambulatory Visit: Payer: Self-pay | Admitting: Infectious Diseases

## 2016-10-25 DIAGNOSIS — B20 Human immunodeficiency virus [HIV] disease: Secondary | ICD-10-CM

## 2016-11-22 ENCOUNTER — Other Ambulatory Visit: Payer: Self-pay | Admitting: Infectious Diseases

## 2016-11-22 DIAGNOSIS — B20 Human immunodeficiency virus [HIV] disease: Secondary | ICD-10-CM

## 2016-12-23 ENCOUNTER — Other Ambulatory Visit: Payer: Self-pay

## 2017-01-06 ENCOUNTER — Ambulatory Visit: Payer: Self-pay | Admitting: Infectious Diseases

## 2017-01-06 ENCOUNTER — Encounter: Payer: Self-pay | Admitting: Infectious Diseases

## 2017-01-06 ENCOUNTER — Ambulatory Visit (INDEPENDENT_AMBULATORY_CARE_PROVIDER_SITE_OTHER): Payer: Self-pay | Admitting: Infectious Diseases

## 2017-01-06 VITALS — BP 108/70 | HR 96 | Temp 98.6°F | Wt 126.0 lb

## 2017-01-06 DIAGNOSIS — Z79899 Other long term (current) drug therapy: Secondary | ICD-10-CM

## 2017-01-06 DIAGNOSIS — Z113 Encounter for screening for infections with a predominantly sexual mode of transmission: Secondary | ICD-10-CM

## 2017-01-06 DIAGNOSIS — F17200 Nicotine dependence, unspecified, uncomplicated: Secondary | ICD-10-CM

## 2017-01-06 DIAGNOSIS — B20 Human immunodeficiency virus [HIV] disease: Secondary | ICD-10-CM

## 2017-01-06 NOTE — Progress Notes (Signed)
   Subjective:    Patient ID: Rodney RoadsCary Hakes, male    DOB: [birth year], 41 y.o.   MRN: [id number]  HPI 41 yo M with hx of anxiety, HIV+ (dx 8-07), started on complera 01-2012. Had Naive genotype 08-30-08. INI naive previously.  Was changed to Triumeq on 2015. Has been feeling well. Having mild neck pain for 2 weeks. Helped with Motrin.  No problems with ART.   HIV 1 RNA Quant (copies/mL)  Date Value  12/28/2015 <20  04/05/2015 41 (H)  11/01/2014 21   CD4 T Cell Abs (/uL)  Date Value  12/28/2015 1,190  04/05/2015 1,080  11/01/2014 1,040    Review of Systems  Constitutional: Negative for appetite change, chills, fever and unexpected weight change.  Respiratory: Negative for cough and shortness of breath.   Gastrointestinal: Negative for constipation and diarrhea.  Genitourinary: Negative for difficulty urinating.  Musculoskeletal: Positive for arthralgias.  Neurological: Positive for headaches.  prn tylenol for migraines.  Please see HPI. All other systems reviewed and negative.      Objective:   Physical Exam  Constitutional: He appears well-developed and well-nourished.  HENT:  Mouth/Throat: No oropharyngeal exudate.  Eyes: EOM are normal. Pupils are equal, round, and reactive to light.  Neck: Neck supple.  Cardiovascular: Normal rate, regular rhythm and normal heart sounds.  Pulmonary/Chest: Effort normal and breath sounds normal.  Abdominal: Soft. Bowel sounds are normal. There is no tenderness. There is no rebound.  Musculoskeletal: He exhibits no edema.  Lymphadenopathy:    He has no cervical adenopathy.  Psychiatric: He has a normal mood and affect.      Assessment & Plan:

## 2017-01-06 NOTE — Assessment & Plan Note (Signed)
Will check labs today Does not want flu shot. Give him mening vax.  Given condoms.  Will see him in 6 months.

## 2017-01-06 NOTE — Assessment & Plan Note (Signed)
Encouraged to quit. 

## 2017-01-07 ENCOUNTER — Encounter: Payer: Self-pay | Admitting: Infectious Diseases

## 2017-01-07 LAB — COMPREHENSIVE METABOLIC PANEL
AG Ratio: 1.4 (calc) (ref 1.0–2.5)
ALT: 9 U/L (ref 9–46)
AST: 14 U/L (ref 10–40)
Albumin: 4 g/dL (ref 3.6–5.1)
Alkaline phosphatase (APISO): 44 U/L (ref 40–115)
BILIRUBIN TOTAL: 0.5 mg/dL (ref 0.2–1.2)
BUN: 15 mg/dL (ref 7–25)
CALCIUM: 9.3 mg/dL (ref 8.6–10.3)
CO2: 29 mmol/L (ref 20–32)
Chloride: 104 mmol/L (ref 98–110)
Creat: 1.04 mg/dL (ref 0.60–1.35)
Globulin: 2.8 g/dL (calc) (ref 1.9–3.7)
Glucose, Bld: 105 mg/dL — ABNORMAL HIGH (ref 65–99)
Potassium: 4 mmol/L (ref 3.5–5.3)
SODIUM: 140 mmol/L (ref 135–146)
TOTAL PROTEIN: 6.8 g/dL (ref 6.1–8.1)

## 2017-01-07 LAB — CBC
HEMATOCRIT: 42.4 % (ref 38.5–50.0)
Hemoglobin: 14.2 g/dL (ref 13.2–17.1)
MCH: 28.2 pg (ref 27.0–33.0)
MCHC: 33.5 g/dL (ref 32.0–36.0)
MCV: 84.1 fL (ref 80.0–100.0)
MPV: 9.6 fL (ref 7.5–12.5)
Platelets: 215 10*3/uL (ref 140–400)
RBC: 5.04 10*6/uL (ref 4.20–5.80)
RDW: 14.2 % (ref 11.0–15.0)
WBC: 7.3 10*3/uL (ref 3.8–10.8)

## 2017-01-07 LAB — LIPID PANEL
CHOLESTEROL: 145 mg/dL (ref <200)
HDL: 48 mg/dL (ref >40)
LDL CHOLESTEROL (CALC): 74 mg/dL
Non-HDL Cholesterol (Calc): 97 mg/dL (calc) (ref <130)
TRIGLYCERIDES: 146 mg/dL (ref <150)
Total CHOL/HDL Ratio: 3 (calc) (ref <5.0)

## 2017-01-07 LAB — T-HELPER CELL (CD4) - (RCID CLINIC ONLY)
CD4 T CELL ABS: 1400 /uL (ref 400–2700)
CD4 T CELL HELPER: 36 % (ref 33–55)

## 2017-01-07 LAB — RPR: RPR Ser Ql: NONREACTIVE

## 2017-01-08 LAB — HIV-1 RNA QUANT-NO REFLEX-BLD
HIV 1 RNA QUANT: NOT DETECTED {copies}/mL
HIV-1 RNA QUANT, LOG: NOT DETECTED {Log_copies}/mL

## 2017-03-11 ENCOUNTER — Telehealth: Payer: Self-pay | Admitting: *Deleted

## 2017-03-11 NOTE — Telephone Encounter (Signed)
Patient called stating he has been feeling very cold recently and had his land lord come to his home to check his heat. He stated that he was told his heating system is fine. He is also c/o neck pain that has been going on for quite some time. He stated that he did not know when his follow up with Dr. Ninetta LightsHatcher is but that he needed to be seen before his next appt. Spoke to Dr. Ninetta LightsHatcher and patient was advised that unfortunately MD does not have any available urgent appts. Patient advised to go to Urgent Care for evaluation and he agreed with this plan.

## 2017-06-22 ENCOUNTER — Telehealth: Payer: Self-pay | Admitting: *Deleted

## 2017-06-22 ENCOUNTER — Ambulatory Visit: Payer: Self-pay

## 2017-06-22 NOTE — Telephone Encounter (Signed)
Patient missed his appointment for meningitis vaccine today. He called to reschedule (will get this at his office visit 5/8). Patient still needs labs, will come 4/26 9:00 for bloodwork. Andree CossHowell, Brionna Romanek M, RN

## 2017-06-26 ENCOUNTER — Encounter: Payer: Self-pay | Admitting: Infectious Diseases

## 2017-06-26 ENCOUNTER — Other Ambulatory Visit: Payer: Self-pay

## 2017-06-26 ENCOUNTER — Telehealth: Payer: Self-pay | Admitting: *Deleted

## 2017-06-26 DIAGNOSIS — Z79899 Other long term (current) drug therapy: Secondary | ICD-10-CM

## 2017-06-26 DIAGNOSIS — B20 Human immunodeficiency virus [HIV] disease: Secondary | ICD-10-CM

## 2017-06-26 DIAGNOSIS — Z113 Encounter for screening for infections with a predominantly sexual mode of transmission: Secondary | ICD-10-CM

## 2017-06-26 LAB — COMPREHENSIVE METABOLIC PANEL
AG Ratio: 1.3 (calc) (ref 1.0–2.5)
ALT: 11 U/L (ref 9–46)
AST: 18 U/L (ref 10–40)
Albumin: 4 g/dL (ref 3.6–5.1)
Alkaline phosphatase (APISO): 51 U/L (ref 40–115)
BUN: 14 mg/dL (ref 7–25)
CO2: 30 mmol/L (ref 20–32)
CREATININE: 0.86 mg/dL (ref 0.60–1.35)
Calcium: 9.1 mg/dL (ref 8.6–10.3)
Chloride: 105 mmol/L (ref 98–110)
GLOBULIN: 3.1 g/dL (ref 1.9–3.7)
GLUCOSE: 92 mg/dL (ref 65–99)
Potassium: 4.1 mmol/L (ref 3.5–5.3)
Sodium: 141 mmol/L (ref 135–146)
Total Bilirubin: 0.4 mg/dL (ref 0.2–1.2)
Total Protein: 7.1 g/dL (ref 6.1–8.1)

## 2017-06-26 LAB — CBC
HEMATOCRIT: 40.4 % (ref 38.5–50.0)
HEMOGLOBIN: 13.3 g/dL (ref 13.2–17.1)
MCH: 26.8 pg — ABNORMAL LOW (ref 27.0–33.0)
MCHC: 32.9 g/dL (ref 32.0–36.0)
MCV: 81.5 fL (ref 80.0–100.0)
MPV: 9.2 fL (ref 7.5–12.5)
Platelets: 213 10*3/uL (ref 140–400)
RBC: 4.96 10*6/uL (ref 4.20–5.80)
RDW: 14.1 % (ref 11.0–15.0)
WBC: 4.9 10*3/uL (ref 3.8–10.8)

## 2017-06-26 LAB — LIPID PANEL
Cholesterol: 136 mg/dL (ref <200)
HDL: 41 mg/dL (ref >40)
LDL CHOLESTEROL (CALC): 78 mg/dL
NON-HDL CHOLESTEROL (CALC): 95 mg/dL (ref <130)
TRIGLYCERIDES: 86 mg/dL (ref <150)
Total CHOL/HDL Ratio: 3.3 (calc) (ref <5.0)

## 2017-06-26 LAB — T-HELPER CELL (CD4) - (RCID CLINIC ONLY)
CD4 % Helper T Cell: 28 % — ABNORMAL LOW (ref 33–55)
CD4 T Cell Abs: 730 /uL (ref 400–2700)

## 2017-06-26 NOTE — Telephone Encounter (Signed)
Patient here for bloodwork, asked for copy of his last labs.  He states he stopped taking medications, has been "doing the church thing, praying" instead. RN attempted to reeducate patient, patient said "you sound like my brother. I know, I know." He is scheduled to see Dr Ninetta LightsHatcher 5/8. Andree CossHowell, Michelle M, RN

## 2017-06-26 NOTE — Telephone Encounter (Signed)
Thank you :)

## 2017-06-29 LAB — URINE CYTOLOGY ANCILLARY ONLY
CHLAMYDIA, DNA PROBE: NEGATIVE
NEISSERIA GONORRHEA: NEGATIVE

## 2017-06-29 LAB — RPR: RPR Ser Ql: NONREACTIVE

## 2017-06-30 LAB — HIV-1 RNA QUANT-NO REFLEX-BLD
HIV 1 RNA QUANT: 48600 {copies}/mL — AB
HIV-1 RNA QUANT, LOG: 4.69 {Log_copies}/mL — AB

## 2017-07-08 ENCOUNTER — Encounter: Payer: Self-pay | Admitting: Infectious Diseases

## 2017-07-08 ENCOUNTER — Ambulatory Visit (INDEPENDENT_AMBULATORY_CARE_PROVIDER_SITE_OTHER): Payer: Self-pay | Admitting: Licensed Clinical Social Worker

## 2017-07-08 ENCOUNTER — Ambulatory Visit (INDEPENDENT_AMBULATORY_CARE_PROVIDER_SITE_OTHER): Payer: Self-pay | Admitting: Infectious Diseases

## 2017-07-08 DIAGNOSIS — K089 Disorder of teeth and supporting structures, unspecified: Secondary | ICD-10-CM

## 2017-07-08 DIAGNOSIS — F17200 Nicotine dependence, unspecified, uncomplicated: Secondary | ICD-10-CM

## 2017-07-08 DIAGNOSIS — F411 Generalized anxiety disorder: Secondary | ICD-10-CM

## 2017-07-08 DIAGNOSIS — F331 Major depressive disorder, recurrent, moderate: Secondary | ICD-10-CM

## 2017-07-08 DIAGNOSIS — B20 Human immunodeficiency virus [HIV] disease: Secondary | ICD-10-CM

## 2017-07-08 DIAGNOSIS — R630 Anorexia: Secondary | ICD-10-CM

## 2017-07-08 MED ORDER — BICTEGRAVIR-EMTRICITAB-TENOFOV 50-200-25 MG PO TABS: 1.0000 | ORAL_TABLET | Freq: Every day | ORAL | 5 refills | Status: DC

## 2017-07-08 MED ORDER — BICTEGRAVIR-EMTRICITAB-TENOFOV 50-200-25 MG PO TABS: 1.0000 | ORAL_TABLET | Freq: Every day | ORAL | 1 refills | Status: DC

## 2017-07-08 MED FILL — BIKTARVY 50-200-25 MG TABS: 50-200-25 | 30 days supply | Qty: 30 | Fill #0

## 2017-07-08 NOTE — Assessment & Plan Note (Signed)
He is cutting back on cigarettes.

## 2017-07-08 NOTE — Progress Notes (Signed)
HPI: Rodney Chase is a 42 y.o. male who is here to f/u with Dr. Ninetta Lights for his HIV.   Allergies: Allergies  Allergen Reactions  . Penicillins     Vitals: Temp: 98.1 F (36.7 C) (05/08 1106) BP: 116/78 (05/08 1106) Pulse Rate: 92 (05/08 1106)  Past Medical History: Past Medical History:  Diagnosis Date  . HIV (human immunodeficiency virus infection) (HCC)     Social History: Social History   Socioeconomic History  . Marital status: Single    Spouse name: Not on file  . Number of children: Not on file  . Years of education: Not on file  . Highest education level: Not on file  Occupational History  . Not on file  Social Needs  . Financial resource strain: Not on file  . Food insecurity:    Worry: Not on file    Inability: Not on file  . Transportation needs:    Medical: Not on file    Non-medical: Not on file  Tobacco Use  . Smoking status: Current Every Day Smoker    Packs/day: 0.20    Types: Cigarettes  . Smokeless tobacco: Never Used  Substance and Sexual Activity  . Alcohol use: No    Alcohol/week: 0.0 oz  . Drug use: Yes    Types: Marijuana  . Sexual activity: Yes    Partners: Male    Birth control/protection: Condom    Comment: pt. given condoms  Lifestyle  . Physical activity:    Days per week: Not on file    Minutes per session: Not on file  . Stress: Not on file  Relationships  . Social connections:    Talks on phone: Not on file    Gets together: Not on file    Attends religious service: Not on file    Active member of club or organization: Not on file    Attends meetings of clubs or organizations: Not on file    Relationship status: Not on file  Other Topics Concern  . Not on file  Social History Narrative  . Not on file    Previous Regimen: Complera, Triumeq  Current Regimen: Off  Labs: HIV 1 RNA Quant (copies/mL)  Date Value  06/26/2017 48,600 (H)  01/06/2017 <20 NOT DETECTED  12/28/2015 <20   CD4 T Cell Abs (/uL)  Date  Value  06/26/2017 730  01/06/2017 1,400  12/28/2015 1,190   Hep B S Ab (no units)  Date Value  09/06/2012 NONREACTIVE   Hepatitis B Surface Ag (no units)  Date Value  04/27/2006 No   HCV Ab (no units)  Date Value  04/27/2006 No    CrCl: Estimated Creatinine Clearance: 88.2 mL/min (by C-G formula based on SCr of 0.86 mg/dL).  Lipids:    Component Value Date/Time   CHOL 136 06/26/2017 1057   TRIG 86 06/26/2017 1057   HDL 41 06/26/2017 1057   CHOLHDL 3.3 06/26/2017 1057   VLDL 15 12/28/2015 1043   LDLCALC 78 06/26/2017 1057    Assessment: Rian is here for his HIV f/u and his VL [place] up again. He decided to take a break from his therapy. He has not renew his HMAP until late April. We convinced him to restart his therapy today with Biktarvy because the pill is much smaller and easier to take. We can use the Advancing Access to get him meds today. He agreed to it. He will pick up the meds at [facility] today and likely next month because HMAP  takes 6-8 wks to be approved now. Advised him that once HMAP is approved, he'll need to get it from [facility].   Recommendations:  Start Biktarvy 1 PO qday Send to [facility] pharmacy  Ulyses Southward, PharmD, BCPS, AAHIVP, CPP Clinical Infectious Disease Pharmacist [facility] 07/08/2017, 10:39 PM  BIN#: [id number] PCN: [id number] GROUP: [id number] MEMBER#:MEMBER#: [id number]

## 2017-07-08 NOTE — Assessment & Plan Note (Signed)
Will get him in with counselor.

## 2017-07-08 NOTE — BH Specialist Note (Signed)
Integrated Behavioral Health Initial Visit  MRN: [id number] Name: Murad Staples  Number of Integrated Behavioral Health Clinician visits:: 1/6 Session Start time: 12:17pm  Session End time: 12:35 pm Total time: 15 minutes  Type of Service: Integrated Behavioral Health- Individual/Family Interpretor:No. Interpretor Name and Language: n/a   Warm Hand Off Completed.       SUBJECTIVE: Hilman Kissling is a 42 y.o. male accompanied by self Patient was referred by Dr. Ninetta Lights  for anxiety and depression. Patient reports the following symptoms/concerns: worried, "keyed up", heart pounding, crying spells, racing thoughts, "hot flashes" when thinking about stressors, feeling alone, sad/depressed mood, desire to sleep Severity of problem: moderate  OBJECTIVE: Mood: Anxious and Affect: Constricted Risk of harm to self or others: No plan to harm self or others  LIFE CONTEXT: Family and Social: Patient reports he is "all alone" without friends or support other than his dog. He states that his mother died 20 years ago, and he is still stuck in grieving. He also reports that his dogs were picked up by the dog catcher and he grieves that loss as well.  School/Work: Patient states he cannot hold a job, hopes to be working soon but has not yet heard anything from any of the places he has applied to Life Changes: Starting new medications feels like "starting all over again" and is stressful.  GOALS ADDRESSED: Patient will: 1. Reduce symptoms of: anxiety and depression 2. Increase knowledge and/or ability of: self-management skills   INTERVENTIONS: Interventions utilized: Solution-Focused Strategies and Supportive Counseling   ASSESSMENT: Patient currently experiencing anxiety in various areas of life, anhedonia, feelings of isolation and tendency to isolate, crying jags, worries about the future, grief over loss of mom many years ago, low self-esteem, feelings of worthlessness. His symptoms are   Consistent with a diagnosis of Major Depressive Disorder, recurring in frequency and of moderate severity. Patient reports he is most bothered by anxiety state. Counselor will continue to monitor whethere this constitutes a different or additional diagnosis, or is part of the depression. Patient states that he feels all alone and as though he is starting over again medically because he is beginning a new medicine. Patient reports this feels overwhelming to him and that every little thing he has to do seems too big. Counselor normalized feelings of being overwhelmed, and emphasized that with large events like the loss of a parent the goal is to make it a part of him rather than the way he defines himself. Patient was engaged with counselor and asked to begin regular sessions.   Patient may benefit from continued and regular counseling; CBT to address unhealthy thinking patterns.  PLAN: Follow up with behavioral health clinician on : 07/14/17  Angus Palms, LCSW

## 2017-07-08 NOTE — Progress Notes (Signed)
   Subjective:    Patient ID: Rodney Chase, male    DOB: 03-08-1975, 42 y.o.   MRN: [id number]  HPI 42yo M with hx of anxiety, HIV+ (dx 8-07), started on complera 01-2012. Had Naive genotype 08-30-08. INI naive previously.  Was changed to Triumeq on 2015. Took a break from his ART last month. Feels fine.   Would like liquid or smaller pill.  Wants megace  HIV 1 RNA Quant (copies/mL)  Date Value  06/26/2017 48,600 (H)  01/06/2017 <20 NOT DETECTED  12/28/2015 <20   CD4 T Cell Abs (/uL)  Date Value  06/26/2017 730  01/06/2017 1,400  12/28/2015 1,190    Review of Systems  Constitutional: Positive for appetite change and fatigue. Negative for unexpected weight change.  Respiratory: Negative for cough and shortness of breath.   Gastrointestinal: Negative for constipation and diarrhea.  Genitourinary: Negative for difficulty urinating.  Hematological: Positive for adenopathy.  Psychiatric/Behavioral: The patient is nervous/anxious.   Please see HPI. All other systems reviewed and negative.      Objective:   Physical Exam  Constitutional: He is oriented to person, place, and time. He appears well-developed and well-nourished.  HENT:  Head: Normocephalic and atraumatic.  Eyes: Pupils are equal, round, and reactive to light. Conjunctivae and EOM are normal.  Neck: Normal range of motion. Neck supple.  Cardiovascular: Normal rate, regular rhythm and normal heart sounds.  Pulmonary/Chest: Effort normal and breath sounds normal.  Musculoskeletal: Normal range of motion.  Neurological: He is alert and oriented to person, place, and time.  Psychiatric: He has a normal mood and affect.        Assessment & Plan:

## 2017-07-08 NOTE — Assessment & Plan Note (Signed)
He is going to f/u with [facility]

## 2017-07-08 NOTE — Assessment & Plan Note (Signed)
Will refill his megace

## 2017-07-08 NOTE — Assessment & Plan Note (Signed)
He will restart on smaller pill.  Will have pharm meet with him Will see him back in 2 months.

## 2017-07-14 ENCOUNTER — Institutional Professional Consult (permissible substitution): Payer: Self-pay | Admitting: Licensed Clinical Social Worker

## 2017-07-22 ENCOUNTER — Encounter: Payer: Self-pay | Admitting: Infectious Diseases

## 2017-08-12 ENCOUNTER — Ambulatory Visit: Payer: Self-pay | Admitting: Licensed Clinical Social Worker

## 2017-08-12 ENCOUNTER — Telehealth: Payer: Self-pay

## 2017-08-12 NOTE — Telephone Encounter (Signed)
PT called today to confirm an appt with Dr. Ninetta LightsHatcher. Informed pt that his appt is 09/02/17. Pt also stated during the call that he would like to make an appt to meet our counselor Rene KocherRegina; made an appt for pt to be seen today at 12. Pt stated that he would try to make this appt, but will call us if he needs to reschedule since he is waiting on a call from the temp. Agency. Lorenso CourierJose L Kynadie Yaun, [place]

## 2017-08-23 ENCOUNTER — Encounter (HOSPITAL_COMMUNITY): Payer: Self-pay | Admitting: Emergency Medicine

## 2017-08-23 ENCOUNTER — Emergency Department (HOSPITAL_COMMUNITY): Payer: Self-pay

## 2017-08-23 ENCOUNTER — Emergency Department (HOSPITAL_COMMUNITY)
Admission: EM | Admit: 2017-08-23 | Discharge: 2017-08-23 | Disposition: A | Discharge status: Home / Self Care | Payer: Self-pay | Attending: Emergency Medicine | Admitting: Emergency Medicine

## 2017-08-23 DIAGNOSIS — F17210 Nicotine dependence, cigarettes, uncomplicated: Secondary | ICD-10-CM | POA: Insufficient documentation

## 2017-08-23 DIAGNOSIS — B20 Human immunodeficiency virus [HIV] disease: Secondary | ICD-10-CM | POA: Insufficient documentation

## 2017-08-23 DIAGNOSIS — Z79899 Other long term (current) drug therapy: Secondary | ICD-10-CM | POA: Insufficient documentation

## 2017-08-23 DIAGNOSIS — R531 Weakness: Secondary | ICD-10-CM | POA: Insufficient documentation

## 2017-08-23 DIAGNOSIS — R519 Headache, unspecified: Secondary | ICD-10-CM

## 2017-08-23 DIAGNOSIS — I10 Essential (primary) hypertension: Secondary | ICD-10-CM | POA: Insufficient documentation

## 2017-08-23 DIAGNOSIS — R51 Headache: Secondary | ICD-10-CM | POA: Insufficient documentation

## 2017-08-23 LAB — COMPREHENSIVE METABOLIC PANEL
ALT: 31 U/L (ref 17–63)
AST: 35 U/L (ref 15–41)
Albumin: 4 g/dL (ref 3.5–5.0)
Alkaline Phosphatase: 44 U/L (ref 38–126)
Anion gap: 8 (ref 5–15)
BUN: 9 mg/dL (ref 6–20)
CO2: 27 mmol/L (ref 22–32)
CREATININE: 0.91 mg/dL (ref 0.61–1.24)
Calcium: 9.2 mg/dL (ref 8.9–10.3)
Chloride: 103 mmol/L (ref 101–111)
GFR calc Af Amer: >60 mL/min (ref >60)
GLUCOSE: 80 mg/dL (ref 65–99)
Potassium: 3.9 mmol/L (ref 3.5–5.1)
SODIUM: 138 mmol/L (ref 135–145)
Total Bilirubin: 0.9 mg/dL (ref 0.3–1.2)
Total Protein: 7.6 g/dL (ref 6.5–8.1)

## 2017-08-23 LAB — CBC
HCT: 41.2 % (ref 39.0–52.0)
Hemoglobin: 13.1 g/dL (ref 13.0–17.0)
MCH: 27 pg (ref 26.0–34.0)
MCHC: 31.8 g/dL (ref 30.0–36.0)
MCV: 84.8 fL (ref 78.0–100.0)
PLATELETS: 224 10*3/uL (ref 150–400)
RBC: 4.86 MIL/uL (ref 4.22–5.81)
RDW: 14.6 % (ref 11.5–15.5)
WBC: 6.7 10*3/uL (ref 4.0–10.5)

## 2017-08-23 LAB — URINALYSIS, ROUTINE W REFLEX MICROSCOPIC
Bilirubin Urine: NEGATIVE
GLUCOSE, UA: NEGATIVE mg/dL
HGB URINE DIPSTICK: NEGATIVE
KETONES UR: NEGATIVE mg/dL
Leukocytes, UA: NEGATIVE
Nitrite: NEGATIVE
PROTEIN: NEGATIVE mg/dL
Specific Gravity, Urine: 1.01 (ref 1.005–1.030)
pH: 8 (ref 5.0–8.0)

## 2017-08-23 LAB — LIPASE, BLOOD: Lipase: 18 U/L (ref 11–51)

## 2017-08-23 MED ORDER — SODIUM CHLORIDE 0.9 % IV BOLUS
1000.0000 mL | Freq: Once | INTRAVENOUS | Status: AC
  Administered 2017-08-23: 1000 mL via INTRAVENOUS

## 2017-08-23 NOTE — ED Triage Notes (Addendum)
Pt states he has been feeling tired since this morning before church. Pt states he feels weak when walking. Pt reports nausea and 1 episode of emesis. Pt also had generalized abdominal pain, none at present though. Denies pain with urination. Denies chest pain. Endorses shortness of breath at times. States when he works his arms out they feel weak. This has been going on for several weeks. Pt has many complaints. No pain anywhere currently. Pt states "if feels like my symptoms are resolving. I am just nervous."

## 2017-08-23 NOTE — ED Notes (Signed)
Patient transported to CT 

## 2017-08-23 NOTE — ED Notes (Signed)
Pt walked out while nurse was not at desk. Pt notified MD he did not want his paperwork and had to go pick up his kids and ambulated steady gait out of ED.

## 2017-08-23 NOTE — ED Provider Notes (Signed)
[facility] EMERGENCY DEPARTMENT Provider Note   CSN: [id number] Arrival date & time: 08/23/17  1453     History   Chief Complaint Chief Complaint  Patient presents with  . Dehydration  . Fatigue  . Shortness of Breath  . Abdominal Pain    HPI Rodney Chase is a 42 y.o. male with history of HIV, hypertension, anxiety, headaches, epigastric pain, neck pain, back pain presents for evaluation of acute onset of generalized fatigue.  He states that when he awoke this morning he felt very fatigued.  States this worsens with ambulation and he feels very weak, left worse than right.  He also notes left-sided numbness that he noticed since this morning.  While he was driving to church at around 10 AM he developed sudden onset throbbing frontal headache which he states was "excruciating".  This improved with Tylenol and a cold rag.  Endorses nausea and one episode of nonbloody nonbilious emesis but denies any abdominal pain at this time.  He did experience approximately 15 minutes of generalized dull pain "deep in my belly ", but he is expands pain like this in the past.  He denies melena, hematochezia, urinary symptoms, chest pain, or shortness of breath.  He is unsure if he has been febrile but states he has felt warm all day.  Endorses lightheadedness which worsens with ambulation, denies syncope.  He smokes a few cigarettes daily, smokes marijuana multiple times daily.  Denies any other recreational drug use or excessive alcohol intake.  Denies travel outside of the country.  States he has missed his HIV medication twice this past week, viral load was detectable and CD4 count was 730 when labs were last check with his infectious disease doctor on 06/26/2017.   The history is provided by the patient.    Past Medical History:  Diagnosis Date  . HIV (human immunodeficiency virus infection) [facility])     Patient Active Problem List   Diagnosis Date Noted  . Essential hypertension  11/01/2014  . Hidradenitis suppurativa 11/01/2014  . Poor dentition 10/19/2013  . 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 Lymphadenopathy, inguinal 01/21/2013  . Prostatitis 11/15/2012  . Sinusitis, chronic 04/26/2012  . Onychomycosis due to dermatophyte 02/04/2011  . TOBACCO USER 04/01/2010  . 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 NECK PAIN 10/31/2008  . BACK PAIN, ACUTE 09/24/2007  . Anxiety state 03/02/2007  . EPIGASTRIC PAIN 11/10/2006  . Human immunodeficiency virus (HIV) disease (HCC) 03/27/2006  . HEADACHE 03/27/2006    No past surgical history on file.      Home Medications    Prior to Admission medications   Medication Sig Start Date End Date Taking? Authorizing Provider  acetaminophen (TYLENOL) 500 MG tablet Take 1,000 mg by mouth every 6 (six) hours as needed for mild pain or headache.   Yes [provider]  bictegravir-emtricitabine-tenofovir AF (BIKTARVY) 50-200-25 MG TABS tablet Take 1 tablet by mouth daily. 07/08/17  Yes Ginnie Smart, MD  bictegravir-emtricitabine-tenofovir AF (BIKTARVY) 50-200-25 MG TABS tablet Take 1 tablet by mouth daily. 07/08/17   Ginnie Smart, MD  hydrOXYzine (ATARAX/VISTARIL) 25 MG tablet Take 1 tablet (25 mg total) by mouth every 6 (six) hours. Patient not taking: Reported on 01/06/2017 06/09/16   Deatra Canter, FNP    Family History Family History  Problem Relation Age of Onset  . Diabetes Daughter   . Cancer Mother        breast    Social History Social History  Tobacco Use  . Smoking status: Current Every Day Smoker    Packs/day: 0.20    Types: Cigarettes  . Smokeless tobacco: Never Used  Substance Use Topics  . Alcohol use: No    Alcohol/week: 0.0 oz  . Drug use: Yes    Types: Marijuana     Allergies   Penicillins   Review of Systems Review of Systems  Constitutional: Positive for fatigue and fever.  Respiratory: Negative for shortness of breath.   Cardiovascular: Negative for chest pain.  Gastrointestinal:  Positive for abdominal pain, nausea and vomiting. Negative for constipation and diarrhea.  Genitourinary: Negative for dysuria and hematuria.  Neurological: Positive for weakness, light-headedness, numbness and headaches. Negative for syncope.  All other systems reviewed and are negative.    Physical Exam Updated Vital Signs BP 124/80   Pulse 65   Temp 98.2 F (36.8 C) (Oral)   Resp 16   Ht 5\' 3"  (1.6 m)   Wt 59 kg (130 lb)   SpO2 99%   BMI 23.03 kg/m   Physical Exam  Constitutional: He is oriented to person, place, and time. He appears well-developed and well-nourished. No distress.  Resting comfortably in bed  HENT:  Head: Normocephalic and atraumatic.  Eyes: Pupils are equal, round, and reactive to light. Conjunctivae and EOM are normal. Right eye exhibits no discharge. Left eye exhibits no discharge.  No pain with EOMs, no nystagmus  Neck: Normal range of motion. Neck supple. No JVD present. No tracheal deviation present. No Brudzinski's sign and no Kernig's sign noted.  Cardiovascular: Normal rate, regular rhythm, normal heart sounds and intact distal pulses.  Pulmonary/Chest: Effort normal and breath sounds normal. No accessory muscle usage. No tachypnea. He exhibits no tenderness.  Abdominal: He exhibits no distension.  Musculoskeletal: He exhibits no edema.       Right lower leg: Normal. He exhibits no tenderness and no edema.       Left lower leg: Normal. He exhibits no tenderness and no edema.  Neurological: He is alert and oriented to person, place, and time.  Mental Status:  Alert, thought content appropriate, able to give a coherent history. Speech fluent without evidence of aphasia. Able to follow 2 step commands without difficulty.  Cranial Nerves:  II:  Peripheral visual fields grossly normal, pupils equal, round, reactive to light III,IV, VI: ptosis not present, extra-ocular motions intact bilaterally  V,VII: smile symmetric, altered sensation to soft touch of  the left side of the face VIII: hearing grossly normal to voice  X: uvula elevates symmetrically  XI: bilateral shoulder shrug symmetric and strong XII: midline tongue extension without fassiculations Motor:  Normal tone. 5/5 strength of BUE and BLE major muscle groups including strong and equal grip strength and dorsiflexion/plantar flexion Sensory: Altered sensation to soft touch of the left side of the face and extremities Cerebellar: normal finger-to-nose with bilateral upper extremities, although patient states he feels very fatigued when lifting his arms bilaterally Gait: Ambulates with a slow slightly unsteady gait.  Able to walk on heels and toes with mild difficulty  No pronator drift, although both  upper extremities will lower symmetrically when patient is asked to elevate his arms against gravity for greater than 10 seconds   Romberg sign is present.  Skin: Skin is warm and dry. No erythema.  Psychiatric: He has a normal mood and affect. His behavior is normal.  Nursing note and vitals reviewed.     ED Treatments / Results  Labs (all labs  ordered are listed, but only abnormal results are displayed) Labs Reviewed  URINALYSIS, ROUTINE W REFLEX MICROSCOPIC - Abnormal; Notable for the following components:      Result Value   Color, Urine STRAW (*)    All other components within normal limits  LIPASE, BLOOD  COMPREHENSIVE METABOLIC PANEL  CBC    EKG EKG Interpretation  Date/Time:  Sunday August 23 2017 16:01:29 EDT Ventricular Rate:  75 PR Interval:    QRS Duration: 80 QT Interval:  387 QTC Calculation: 433 R Axis:   89 Text Interpretation:  Sinus rhythm ST elevation c/w early repolarization vs pericarditis Confirmed by Tilden Fossa [phone]) on 08/23/2017 4:26:10 PM   Radiology Dg Chest 2 View  Result Date: 08/23/2017 CLINICAL DATA:  Shortness of breath. EXAM: CHEST - 2 VIEW COMPARISON:  Radiographs of June 28, 2015. FINDINGS: The heart size and mediastinal  contours are within normal limits. Both lungs are clear. No pneumothorax or pleural effusion is noted. The visualized skeletal structures are unremarkable. IMPRESSION: No active cardiopulmonary disease. Electronically Signed   By: Lupita Raider, M.D.   On: 08/23/2017 17:41   Ct Head Wo Contrast  Result Date: 08/23/2017 CLINICAL DATA:  42 year old male with history of weakness while walking. Worst headache of his life this morning. EXAM: CT HEAD WITHOUT CONTRAST TECHNIQUE: Contiguous axial images were obtained from the base of the skull through the vertex without intravenous contrast. COMPARISON:  None. FINDINGS: Brain: No evidence of acute infarction, hemorrhage, hydrocephalus, extra-axial collection or mass lesion/mass effect. Vascular: No hyperdense vessel or unexpected calcification. Skull: Normal. Negative for fracture or focal lesion. Sinuses/Orbits: No acute finding. Other: None. IMPRESSION: 1. No acute intracranial abnormalities. The appearance of the brain is normal. Electronically Signed   By: Trudie Reed M.D.   On: 08/23/2017 18:57    Procedures Procedures (including critical care time)  Medications Ordered in ED Medications  sodium chloride 0.9 % bolus 1,000 mL (0 mLs Intravenous Stopped 08/23/17 1800)     Initial Impression / Assessment and Plan / ED Course  I have reviewed the triage vital signs and the nursing notes.  Pertinent labs & imaging results that were available during my care of the patient were reviewed by me and considered in my medical decision making (see chart for details).     Patient resents for evaluation of acute onset, resolved headache and ongoing generalized fatigue.  He is afebrile, vital signs are stable.  He is nontoxic in appearance.  Notes subjective left-sided numbness to soft touch on exam.  Appears to be generally weak, initially told me that weakness was acute but later tells my attending that his weakness is only localized to the left upper  extremity and has been chronic for over 1 year.  No fever or meningeal signs to suggest meningitis.  Lab work reviewed by me shows no leukocytosis, no anemia, no electrolyte abnormalities.  No concern for UTI or nephrolithiasis.  Chest x-ray shows no acute cardiopulmonary abnormalities EKG shows normal sinus rhythm with changes consistent with early repolarization but no ischemic changes.  Doubt ACS or MI.  CT head shows no acute intracranial abnormalities.  Low suspicion of subarachnoid hemorrhage, CVA, or ICH.  Was informed prior to reevaluation of the patient that he walked out without my or RN knowledge.  He did speak with Dr. Madilyn Hook who evaluated the patient and agrees that he would likely be stable for discharge home.  He informed Dr. Madilyn Hook that he needed to leave and pick  up his children were refused to stay for his paperwork or discussion of his workup. Final Clinical Impressions(s) / ED Diagnoses   Final diagnoses:  Weakness  Bad headache    ED Discharge Orders    None       Bennye AlmFawze, Lateesha Bezold A, PA-C 08/24/17 0051    Tilden Fossaees, Elizabeth, MD 09/01/17 [phone]

## 2017-08-23 NOTE — ED Notes (Signed)
Patient transported to X-ray 

## 2017-09-02 ENCOUNTER — Ambulatory Visit: Payer: Self-pay | Admitting: Infectious Diseases

## 2017-09-21 ENCOUNTER — Ambulatory Visit (HOSPITAL_COMMUNITY)
Admission: EM | Admit: 2017-09-21 | Discharge: 2017-09-21 | Disposition: A | Discharge status: Home / Self Care | Payer: Self-pay

## 2017-09-21 NOTE — ED Notes (Signed)
Patient called for triage x2 with no response 

## 2017-09-22 ENCOUNTER — Encounter (HOSPITAL_COMMUNITY): Payer: Self-pay | Admitting: Emergency Medicine

## 2017-09-22 ENCOUNTER — Ambulatory Visit (HOSPITAL_COMMUNITY)
Admission: EM | Admit: 2017-09-22 | Discharge: 2017-09-22 | Disposition: A | Discharge status: Home / Self Care | Payer: Self-pay | Attending: Family Medicine | Admitting: Family Medicine

## 2017-09-22 DIAGNOSIS — M25561 Pain in right knee: Secondary | ICD-10-CM

## 2017-09-22 DIAGNOSIS — M542 Cervicalgia: Secondary | ICD-10-CM

## 2017-09-22 MED ORDER — IBUPROFEN 800 MG PO TABS: 800.0000 mg | ORAL_TABLET | Freq: Three times a day (TID) | ORAL | 0 refills | Status: DC

## 2017-09-22 NOTE — Discharge Instructions (Signed)
Take ibuprofen 3 times a day with food, as needed for knee pain or headache Follow-up with your primary care doctor as scheduled

## 2017-09-22 NOTE — ED Triage Notes (Signed)
Pt sts neck pain x 2 months; htn and right knee pain

## 2017-09-22 NOTE — ED Provider Notes (Signed)
[facility]    CSN: [id number] Arrival date & time: 09/22/17  1027     History   Chief Complaint Chief Complaint  Patient presents with  . multiple complaints    HPI Junius RoadsCary Neace is a 42 y.o. male.   HPI  Patient is complaining of neck pain with range of motion.  No injury or trauma.  He does spend a lot of time looking at his phone screen and computer screen in a downward fashion.  He  he has no history of neck injury.  No numbness or weakness in the hands.  This is been bothering him off and on for a couple of months. He worries also about his right knee.  He forcibly hit it on a wooden edge and he has had some pain off and on ever since.  He wonders if there is any "damage".  No buckling, no locking, no instability.  No swelling, no bruising.  He walks with a normal gait. He also complains that at times he feels like his blood pressure is elevated.  He will have spells where he has a headache and feels slightly dizzy.  He does not take his blood pressure at this time, but assumes the symptoms are from blood pressure.  His blood pressure in the office today is normal.  I have told him that without blood pressure readings during the spell, it is impossible for me to say if he has significant blood pressure issues.  He states blood pressure problems do run in his family. He states he has been compliant with his HIV medication.  He states he is only missed one dose in the last month.  Past Medical History:  Diagnosis Date  . HIV (human immunodeficiency virus infection) [facility](HCC)     Patient Active Problem List   Diagnosis Date Noted  . Essential hypertension 11/01/2014  . Hidradenitis suppurativa 11/01/2014  . Poor dentition 10/19/2013  . 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 Lymphadenopathy, inguinal 01/21/2013  . Onychomycosis due to dermatophyte 02/04/2011  . TOBACCO USER 04/01/2010  . 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 Anxiety state 03/02/2007  . EPIGASTRIC PAIN 11/10/2006  .  Human immunodeficiency virus (HIV) disease (HCC) 03/27/2006  . HEADACHE 03/27/2006    History reviewed. No pertinent surgical history.     Home Medications    Prior to Admission medications   Medication Sig Start Date End Date Taking? Authorizing Provider  acetaminophen (TYLENOL) 500 MG tablet Take 1,000 mg by mouth every 6 (six) hours as needed for mild pain or headache.    [provider]  bictegravir-emtricitabine-tenofovir AF (BIKTARVY) 50-200-25 MG TABS tablet Take 1 tablet by mouth daily. 07/08/17   Ginnie SmartHatcher, Jeffrey C, MD  ibuprofen (ADVIL,MOTRIN) 800 MG tablet Take 1 tablet (800 mg total) by mouth 3 (three) times daily. 09/22/17   Eustace MooreNelson, Offie Pickron Sue, MD    Family History Family History  Problem Relation Age of Onset  . Diabetes Daughter   . Cancer Mother        breast    Social History Social History   Tobacco Use  . Smoking status: Current Every Day Smoker    Packs/day: 0.20    Types: Cigarettes  . Smokeless tobacco: Never Used  Substance Use Topics  . Alcohol use: No    Alcohol/week: 0.0 oz  . Drug use: Yes    Types: Marijuana     Allergies   Penicillins   Review of Systems Review of Systems  Constitutional: Negative for  chills and fever.  HENT: Negative for ear pain and sore throat.   Eyes: Negative for pain and visual disturbance.  Respiratory: Negative for cough and shortness of breath.   Cardiovascular: Negative for chest pain and palpitations.  Gastrointestinal: Negative for abdominal pain and vomiting.  Genitourinary: Negative for dysuria and hematuria.  Musculoskeletal: Positive for arthralgias, neck pain and neck stiffness. Negative for back pain.  Skin: Negative for color change and rash.  Neurological: Positive for dizziness and headaches. Negative for seizures, syncope, weakness and numbness.  All other systems reviewed and are negative.    Physical Exam Triage Vital Signs ED Triage Vitals [09/22/17 1049]  Enc Vitals Group      BP 129/88     Pulse Rate 68     Resp 18     Temp 97.8 F (36.6 C)     Temp Source Oral     SpO2 97 %   No data found.  Updated Vital Signs BP 129/88 (BP Location: Left Arm)   Pulse 68   Temp 97.8 F (36.6 C) (Oral)   Resp 18   SpO2 97%       Physical Exam  Constitutional: He appears well-developed and well-nourished. No distress.  Negative.  Pleasant.  No acute distress  HENT:  Head: Normocephalic and atraumatic.  Right Ear: External ear normal.  Left Ear: External ear normal.  Mouth/Throat: Oropharynx is clear and moist.  Eyes: Pupils are equal, round, and reactive to light. Conjunctivae are normal.  Disks are flat.  Pupils equal  Neck: Normal range of motion. Neck supple.  No tenderness palpation of the cervical muscles.  Mild tenderness to palpation of upper body of trapezius.  Full range of motion.  Strength sensation range of motion and reflexes are intact in both upper extremities  Cardiovascular: Normal rate, regular rhythm and normal heart sounds.  Pulmonary/Chest: Effort normal. No respiratory distress.  Abdominal: Soft. He exhibits no distension.  Musculoskeletal: Normal range of motion. He exhibits no edema.  Both knees are examined.  No swelling, effusion.  Full range of motion.  Right knee exhibits pain with patellofemoral grind testing.  Neurological: He is alert.  Skin: Skin is warm and dry.  Psychiatric: He has a normal mood and affect. His behavior is normal.     UC Treatments / Results  Labs (all labs ordered are listed, but only abnormal results are displayed) Labs Reviewed - No data to display  EKG None  Radiology No results found.  Procedures Procedures (including critical care time)  Medications Ordered in UC Medications - No data to display  Initial Impression / Assessment and Plan / UC Course  I have reviewed the triage vital signs and the nursing notes.  Pertinent labs & imaging results that were available during my care of the  patient were reviewed by me and considered in my medical decision making (see chart for details).      Final Clinical Impressions(s) / UC Diagnoses   Final diagnoses:  Anterior knee pain, right  Musculoskeletal neck pain     Discharge Instructions     Take ibuprofen 3 times a day with food, as needed for knee pain or headache Follow-up with your primary care doctor as scheduled    ED Prescriptions    Medication Sig Dispense Auth. Provider   ibuprofen (ADVIL,MOTRIN) 800 MG tablet Take 1 tablet (800 mg total) by mouth 3 (three) times daily. 21 tablet Eustace Moore, MD     Controlled Substance Prescriptions  [place] Controlled Substance Registry consulted? Not Applicable   Eustace Moore, MD 09/22/17 [id number]

## 2017-09-23 ENCOUNTER — Ambulatory Visit: Payer: Self-pay | Admitting: Licensed Clinical Social Worker

## 2017-09-23 ENCOUNTER — Ambulatory Visit: Payer: Self-pay

## 2017-09-23 ENCOUNTER — Ambulatory Visit: Payer: Self-pay | Admitting: Infectious Diseases

## 2017-10-07 ENCOUNTER — Other Ambulatory Visit: Payer: Self-pay | Admitting: Behavioral Health

## 2017-10-07 ENCOUNTER — Telehealth: Payer: Self-pay

## 2017-10-07 ENCOUNTER — Telehealth: Payer: Self-pay | Admitting: Behavioral Health

## 2017-10-07 DIAGNOSIS — B20 Human immunodeficiency virus [HIV] disease: Secondary | ICD-10-CM

## 2017-10-07 NOTE — Telephone Encounter (Signed)
Called patient back after he left a voicemail.  Patient wanted to reschedule an appointment with Dr. Ninetta LightsHatcher, and he wanted to be added to the dental list.  Patient was scheduled with Dr. Ninetta LightsHatcher 8/292019 and [facility] left her a voicemail with patient's information to inquire about dental for him. Angeline SlimAshley Reagyn Facemire RN

## 2017-10-07 NOTE — Telephone Encounter (Signed)
Pharmacy called today to inform us pt was trying to pick up his Biktarvy from them and that he has not had any refills picked up since May. Pharmacist was just giving us a call to inform our office pt might not be adherent and that his Viral load would be high. Pt is scheduled to see lab on 10/09/17 for labs. Lorenso CourierJose L Maldonado, [place]

## 2017-10-09 ENCOUNTER — Other Ambulatory Visit: Payer: Self-pay

## 2017-10-13 ENCOUNTER — Other Ambulatory Visit: Payer: Self-pay

## 2017-10-13 DIAGNOSIS — B20 Human immunodeficiency virus [HIV] disease: Secondary | ICD-10-CM

## 2017-10-14 LAB — T-HELPER CELL (CD4) - (RCID CLINIC ONLY)
CD4 T CELL HELPER: 33 % (ref 33–55)
CD4 T Cell Abs: 910 /uL (ref 400–2700)

## 2017-10-15 LAB — HIV-1 RNA QUANT-NO REFLEX-BLD
HIV 1 RNA QUANT: DETECTED {copies}/mL — AB
HIV-1 RNA Quant, Log: <1.3 Log copies/mL — AB

## 2017-10-29 ENCOUNTER — Ambulatory Visit (INDEPENDENT_AMBULATORY_CARE_PROVIDER_SITE_OTHER): Payer: Self-pay | Admitting: Infectious Diseases

## 2017-10-29 VITALS — BP 102/68 | HR 82 | Temp 98.6°F | Wt 120.0 lb

## 2017-10-29 DIAGNOSIS — B20 Human immunodeficiency virus [HIV] disease: Secondary | ICD-10-CM

## 2017-11-05 ENCOUNTER — Ambulatory Visit: Payer: Self-pay | Admitting: Infectious Diseases

## 2017-12-17 ENCOUNTER — Ambulatory Visit: Payer: Self-pay | Admitting: Infectious Diseases

## 2017-12-21 ENCOUNTER — Telehealth: Payer: Self-pay

## 2017-12-21 NOTE — Telephone Encounter (Signed)
Patient called office stating he is experiencing some arm and hand pain. Patient feels like he could have carpal tunnel or arthritis. Patient does not have a pcp, and would like Dr. Ninetta Lights to evaluate him. Patient is scheduled to come into office on 10/30. Lorenso Courier, [place]

## 2017-12-30 ENCOUNTER — Ambulatory Visit: Payer: Self-pay | Admitting: Infectious Diseases

## 2018-01-01 ENCOUNTER — Telehealth: Payer: Self-pay

## 2018-01-01 NOTE — Telephone Encounter (Signed)
Patient walked into office today stating he is having pain on his hand and arm. Advised patient that he needs to see a primary care doctor or go to urgent care for evaluation. Patient verbalized understanding and will reach out to [facility] to establish care.  Jose Darnelle Bos

## 2018-01-04 ENCOUNTER — Encounter: Payer: Self-pay | Admitting: Family

## 2018-01-05 ENCOUNTER — Ambulatory Visit (INDEPENDENT_AMBULATORY_CARE_PROVIDER_SITE_OTHER): Payer: Self-pay | Admitting: Family

## 2018-01-05 ENCOUNTER — Other Ambulatory Visit: Payer: Self-pay | Admitting: Family

## 2018-01-05 ENCOUNTER — Encounter: Payer: Self-pay | Admitting: Family

## 2018-01-05 VITALS — BP 115/76 | HR 94 | Temp 98.8°F | Wt 121.0 lb

## 2018-01-05 DIAGNOSIS — B20 Human immunodeficiency virus [HIV] disease: Secondary | ICD-10-CM

## 2018-01-05 DIAGNOSIS — M62838 Other muscle spasm: Secondary | ICD-10-CM | POA: Insufficient documentation

## 2018-01-05 MED ORDER — BICTEGRAVIR-EMTRICITAB-TENOFOV 50-200-25 MG PO TABS: 1.0000 | ORAL_TABLET | Freq: Every day | ORAL | 5 refills | Status: DC

## 2018-01-05 MED ORDER — ENSURE PO LIQD: 237.0000 mL | Freq: Two times a day (BID) | ORAL | 5 refills | Status: DC

## 2018-01-05 NOTE — Assessment & Plan Note (Signed)
Rodney Chase has well controlled HIV disease with his current regimen of Biktarvy. He is adherent to the regimen without adverse side effects. He has no problems obtaining his medications. No signs/symptoms of opportunistic infection or progressive HIV disease. Declines vaccinations today. Check viral load today. Continue current dose of Biktarvy. Declines condoms and not sexually active at present. Plan for follow up office visit in 4 months or sooner if needed with lab work 1-2 weeks prior to appointment.
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
## 2018-01-05 NOTE — Patient Instructions (Signed)
Nice to meet you.  We will check your blood work today.  Continue to take your Biktarvy today.  Will work with [facility] to get you Ensure/Boost.  For your neck - ice/moist 20 minutes every 2 hours or as needed.  Stretches and exercises throughout the day.    Cervical Strain and Sprain Rehab Ask your health care provider which exercises are safe for you. Do exercises exactly as told by your health care provider and adjust them as directed. It is normal to feel mild stretching, pulling, tightness, or discomfort as you do these exercises, but you should stop right away if you feel sudden pain or your pain gets worse.Do not begin these exercises until told by your health care provider. Stretching and range of motion exercises These exercises warm up your muscles and joints and improve the movement and flexibility of your neck. These exercises also help to relieve pain, numbness, and tingling. Exercise A: Cervical side bend  1. Using good posture, sit on a stable chair or stand up. 2. Without moving your shoulders, slowly tilt your left / right ear to your shoulder until you feel a stretch in your neck muscles. You should be looking straight ahead. 3. Hold for __________ seconds. 4. Repeat with the other side of your neck. Repeat __________ times. Complete this exercise __________ times a day. Exercise B: Cervical rotation  1. Using good posture, sit on a stable chair or stand up. 2. Slowly turn your head to the side as if you are looking over your left / right shoulder. ? Keep your eyes level with the ground. ? Stop when you feel a stretch along the side and the back of your neck. 3. Hold for __________ seconds. 4. Repeat this by turning to your other side. Repeat __________ times. Complete this exercise __________ times a day. Exercise C: Thoracic extension and pectoral stretch 1. Roll a towel or a small blanket so it is about 4 inches (10 cm) in diameter. 2. Lie down on your back on a  firm surface. 3. Put the towel lengthwise, under your spine in the middle of your back. It should not be not under your shoulder blades. The towel should line up with your spine from your middle back to your lower back. 4. Put your hands behind your head and let your elbows fall out to your sides. 5. Hold for __________ seconds. Repeat __________ times. Complete this exercise __________ times a day. Strengthening exercises These exercises build strength and endurance in your neck. Endurance is the ability to use your muscles for a long time, even after your muscles get tired. Exercise D: Upper cervical flexion, isometric 1. Lie on your back with a thin pillow behind your head and a small rolled-up towel under your neck. 2. Gently tuck your chin toward your chest and nod your head down to look toward your feet. Do not lift your head off the pillow. 3. Hold for __________ seconds. 4. Release the tension slowly. Relax your neck muscles completely before you repeat this exercise. Repeat __________ times. Complete this exercise __________ times a day. Exercise E: Cervical extension, isometric  1. Stand about 6 inches (15 cm) away from a wall, with your back facing the wall. 2. Place a soft object, about 6-8 inches (15-20 cm) in diameter, between the back of your head and the wall. A soft object could be a small pillow, a ball, or a folded towel. 3. Gently tilt your head back and press into the  soft object. Keep your jaw and forehead relaxed. 4. Hold for __________ seconds. 5. Release the tension slowly. Relax your neck muscles completely before you repeat this exercise. Repeat __________ times. Complete this exercise __________ times a day. Posture and body mechanics  Body mechanics refers to the movements and positions of your body while you do your daily activities. Posture is part of body mechanics. Good posture and healthy body mechanics can help to relieve stress in your body's tissues and  joints. Good posture means that your spine is in its natural S-curve position (your spine is neutral), your shoulders are pulled back slightly, and your head is not tipped forward. The following are general guidelines for applying improved posture and body mechanics to your everyday activities. Standing  When standing, keep your spine neutral and keep your feet about hip-width apart. Keep a slight bend in your knees. Your ears, shoulders, and hips should line up.  When you do a task in which you stand in one place for a long time, place one foot up on a stable object that is 2-4 inches (5-10 cm) high, such as a footstool. This helps keep your spine neutral. Sitting   When sitting, keep your spine neutral and your keep feet flat on the floor. Use a footrest, if necessary, and keep your thighs parallel to the floor. Avoid rounding your shoulders, and avoid tilting your head forward.  When working at a desk or a computer, keep your desk at a height where your hands are slightly lower than your elbows. Slide your chair under your desk so you are close enough to maintain good posture.  When working at a computer, place your monitor at a height where you are looking straight ahead and you do not have to tilt your head forward or downward to look at the screen. Resting When lying down and resting, avoid positions that are most painful for you. Try to support your neck in a neutral position. You can use a contour pillow or a small rolled-up towel. Your pillow should support your neck but not push on it. This information is not intended to replace advice given to you by your health care provider. Make sure you discuss any questions you have with your health care provider. Document Released: 02/17/2005 Document Revised: 10/25/2015 Document Reviewed: 01/24/2015 Elsevier Interactive Patient Education  [employer].
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
## 2018-01-06 LAB — RPR: RPR: NONREACTIVE

## 2018-01-06 LAB — COMPREHENSIVE METABOLIC PANEL
AG Ratio: 1.3 (calc) (ref 1.0–2.5)
ALT: 8 U/L — ABNORMAL LOW (ref 9–46)
AST: 17 U/L (ref 10–40)
Albumin: 3.9 g/dL (ref 3.6–5.1)
Alkaline phosphatase (APISO): 39 U/L — ABNORMAL LOW (ref 40–115)
BILIRUBIN TOTAL: 0.6 mg/dL (ref 0.2–1.2)
BUN: 15 mg/dL (ref 7–25)
CALCIUM: 9.3 mg/dL (ref 8.6–10.3)
CO2: 30 mmol/L (ref 20–32)
Chloride: 103 mmol/L (ref 98–110)
Creat: 0.94 mg/dL (ref 0.60–1.35)
Globulin: 3.1 g/dL (calc) (ref 1.9–3.7)
Glucose, Bld: 128 mg/dL — ABNORMAL HIGH (ref 65–99)
Potassium: 3.7 mmol/L (ref 3.5–5.3)
Sodium: 137 mmol/L (ref 135–146)
Total Protein: 7 g/dL (ref 6.1–8.1)

## 2018-01-08 LAB — HIV-1 RNA QUANT-NO REFLEX-BLD
HIV 1 RNA Quant: 126 copies/mL — ABNORMAL HIGH
HIV-1 RNA QUANT, LOG: 2.1 {Log_copies}/mL — AB

## 2018-01-22 ENCOUNTER — Telehealth: Payer: Self-pay

## 2018-01-22 NOTE — Telephone Encounter (Signed)
Patient called office regarding lab work done earlier this month.Also to see if Marcos EkeGreg Calone, Np would be able to write a letter stating if he is diabetic or not. Patient has not established primary care yet.  Relayed lab results to patient. Patient states he has missed a few days of medication. Denies missing more than 1-2 a month.  Lorenso CourierJose L Maldonado, [place]

## 2018-01-27 NOTE — Telephone Encounter (Signed)
Based on current lab work would need a hemoglobin A1c to determine if he definitively has diabetes. From the basic blood work it appears he does not.

## 2018-02-18 ENCOUNTER — Telehealth: Payer: Self-pay | Admitting: *Deleted

## 2018-02-18 DIAGNOSIS — M79601 Pain in right arm: Secondary | ICD-10-CM

## 2018-02-18 DIAGNOSIS — R202 Paresthesia of skin: Principal | ICD-10-CM

## 2018-02-18 DIAGNOSIS — M79602 Pain in left arm: Principal | ICD-10-CM

## 2018-02-18 NOTE — Telephone Encounter (Signed)
Referral to neurology sent. Will check A1c at office visit.

## 2018-02-18 NOTE — Telephone Encounter (Signed)
Patient called to let Tammy SoursGreg know his symptoms are unchanged, he is interested in a neurology referral.  RN scheduled patient for follow up with Tammy SoursGreg on 12/26. Noted in chart that Tammy SoursGreg was interested in obtaining an A1C as well. Andree CossHowell, Isaid Salvia M, RN

## 2018-02-18 NOTE — Addendum Note (Signed)
Addended by: Jeanine LuzALONE, Million Maharaj D on: 02/18/2018 05:47 PM   Modules accepted: Orders

## 2018-02-19 NOTE — Telephone Encounter (Signed)
Thanks! I wasn't sure if you needed to see him again before making the referral.

## 2018-02-25 ENCOUNTER — Ambulatory Visit: Payer: Self-pay | Admitting: Family

## 2018-03-05 ENCOUNTER — Ambulatory Visit (INDEPENDENT_AMBULATORY_CARE_PROVIDER_SITE_OTHER): Payer: Self-pay | Admitting: Family

## 2018-03-05 ENCOUNTER — Encounter: Payer: Self-pay | Admitting: Family

## 2018-03-05 VITALS — BP 100/66 | HR 87 | Temp 98.5°F | Wt 127.0 lb

## 2018-03-05 DIAGNOSIS — R739 Hyperglycemia, unspecified: Secondary | ICD-10-CM | POA: Insufficient documentation

## 2018-03-05 DIAGNOSIS — B20 Human immunodeficiency virus [HIV] disease: Secondary | ICD-10-CM

## 2018-03-05 NOTE — Assessment & Plan Note (Signed)
Mr. Rodney Chase had elevated blood sugar on most recent blood work, however even if not fasting is below 200 an unlikely representing diabetes. He has no symptoms currently. Will check A1c.

## 2018-03-05 NOTE — Assessment & Plan Note (Signed)
Rodney Chase has continues to take his Biktarvy with good adherence and tolerance. No signs/symptoms of opportunistic infection. Continue current Biktarvy. Follow up in March as scheduled.

## 2018-03-05 NOTE — Patient Instructions (Signed)
Nice to see you.  Continue to take your Hooks as prescribed.  We will check your A1c today.  Follow up in March as planned.  Happy New Year!!!

## 2018-03-05 NOTE — Progress Notes (Signed)
Subjective:    Patient ID: Rodney Chase, male    DOB: 1976/01/06, 43 y.o.   MRN: [id number]  Chief Complaint  Patient presents with  . HIV Positive/AIDS  . Hyperglycemia     HPI:  Rodney Chase is a 43 y.o. male who presents today for an office visit to discuss labs.   Rodney Chase continues to take his Biktarvy as prescribed with no adverse side effects or missed doses. He has no problems obtaining his medications. Overall feeling good. Continues to work at [employer]. Denies fevers, chills, night sweats, headaches, changes in vision, neck pain/stiffness, nausea, diarrhea, vomiting, lesions or rashes.  Rodney Chase has concern for diabetes as it runs in his family. His most recent blood sugar was elevated at 128, however he does not recall if this was a fasting blood sugar. Denies any excessive hunger, thirst, or urination. Would like to have his A1c checked for confirmation.   Allergies  Allergen Reactions  . Penicillins     Has patient had a PCN reaction causing immediate rash, facial/tongue/throat swelling, SOB or lightheadedness with hypotension: YES Has patient had a PCN reaction causing severe rash involving mucus membranes or skin necrosis: NO Has patient had a PCN reaction that required hospitalization: UNK Has patient had a PCN reaction occurring within the last 10 years: NO If all of the above answers are "NO", then may proceed with Cephalosporin use.      Outpatient Medications Prior to Visit  Medication Sig Dispense Refill  . acetaminophen (TYLENOL) 500 MG tablet Take 1,000 mg by mouth every 6 (six) hours as needed for mild pain or headache.    . bictegravir-emtricitabine-tenofovir AF (BIKTARVY) 50-200-25 MG TABS tablet Take 1 tablet by mouth daily. 30 tablet 5  . ENSURE (ENSURE) Take 237 mLs by mouth 2 (two) times daily between meals. 237 mL 5  . ibuprofen (ADVIL,MOTRIN) 800 MG tablet Take 1 tablet (800 mg total) by mouth 3 (three) times daily. 21 tablet 0   No  facility-administered medications prior to visit.      Past Medical History:  Diagnosis Date  . HIV (human immunodeficiency virus infection) (HCC)      History reviewed. No pertinent surgical history.     Review of Systems  Constitutional: Negative for appetite change, chills, fatigue, fever and unexpected weight change.  Eyes: Negative for visual disturbance.  Respiratory: Negative for cough, chest tightness, shortness of breath and wheezing.   Cardiovascular: Negative for chest pain and leg swelling.  Gastrointestinal: Negative for abdominal pain, constipation, diarrhea, nausea and vomiting.  Endocrine: Negative for polydipsia, polyphagia and polyuria.  Genitourinary: Negative for dysuria, flank pain, frequency, genital sores, hematuria and urgency.  Skin: Negative for rash.  Allergic/Immunologic: Negative for immunocompromised state.  Neurological: Negative for dizziness, numbness and headaches.      Objective:    BP 100/66   Pulse 87   Temp 98.5 F (36.9 C) (Oral)   Wt 127 lb (57.6 kg)   BMI 22.50 kg/m  Nursing note and vital signs reviewed.  Physical Exam Constitutional:      General: He is not in acute distress.    Appearance: He is well-developed.  Eyes:     Conjunctiva/sclera: Conjunctivae normal.  Neck:     Musculoskeletal: Neck supple.  Cardiovascular:     Rate and Rhythm: Normal rate and regular rhythm.     Heart sounds: Normal heart sounds. No murmur. No friction rub. No gallop.   Pulmonary:     Effort: Pulmonary  effort is normal. No respiratory distress.     Breath sounds: Normal breath sounds. No wheezing or rales.  Chest:     Chest wall: No tenderness.  Abdominal:     General: Bowel sounds are normal.     Palpations: Abdomen is soft.     Tenderness: There is no abdominal tenderness.  Lymphadenopathy:     Cervical: No cervical adenopathy.  Skin:    General: Skin is warm and dry.     Findings: No rash.  Neurological:     Mental Status: He  is alert and oriented to person, place, and time.  Psychiatric:        Behavior: Behavior normal.        Thought Content: Thought content normal.        Judgment: Judgment normal.        Assessment & Plan:   Problem List Items Addressed This Visit      Other   Human immunodeficiency virus (HIV) disease (HCC)    Rodney Chase has continues to take his Biktarvy with good adherence and tolerance. No signs/symptoms of opportunistic infection. Continue current Biktarvy. Follow up in March as scheduled.       Relevant Orders   HIV 1 RNA quant-no reflex-bld   Elevated blood sugar level - Primary    Rodney Chase had elevated blood sugar on most recent blood work, however even if not fasting is below 200 an unlikely representing diabetes. He has no symptoms currently. Will check A1c.       Relevant Orders   HgB A1c       I am having Rodney Roads maintain his acetaminophen, ibuprofen, ENSURE, and bictegravir-emtricitabine-tenofovir AF.   Follow-up: Return in about 2 months (around 05/04/2018), or if symptoms worsen or fail to improve.   Marcos Eke, MSN, FNP-C Nurse Practitioner [facility] for Infectious Disease [facility] Office phone: [phone] Pager: [phone] [facility] Main number: [phone]

## 2018-03-09 LAB — HIV-1 RNA QUANT-NO REFLEX-BLD
HIV 1 RNA Quant: <20 copies/mL
HIV-1 RNA QUANT, LOG: NOT DETECTED {Log_copies}/mL

## 2018-03-09 LAB — HEMOGLOBIN A1C
EAG (MMOL/L): 6.3 (calc)
HEMOGLOBIN A1C: 5.6 %{Hb} (ref <5.7)
MEAN PLASMA GLUCOSE: 114 (calc)

## 2018-04-06 ENCOUNTER — Encounter: Payer: Self-pay | Admitting: Neurology

## 2018-04-06 ENCOUNTER — Telehealth: Payer: Self-pay

## 2018-04-06 ENCOUNTER — Ambulatory Visit: Payer: Self-pay | Admitting: Neurology

## 2018-04-06 DIAGNOSIS — M62838 Other muscle spasm: Secondary | ICD-10-CM

## 2018-04-06 DIAGNOSIS — R202 Paresthesia of skin: Secondary | ICD-10-CM

## 2018-04-06 DIAGNOSIS — R200 Anesthesia of skin: Secondary | ICD-10-CM

## 2018-04-06 NOTE — Addendum Note (Signed)
Addended by: Jeanine Luz D on: 04/06/2018 02:17 PM   Modules accepted: Orders

## 2018-04-06 NOTE — Telephone Encounter (Signed)
[facility] and can not afford $50 co pay for the visit and would like referral sent to a different location. Margaret  working on referral to find another location.  Valarie ConesShaquenia Zavion Sleight, LPN

## 2018-04-06 NOTE — Telephone Encounter (Signed)
Referral placed.

## 2018-04-07 ENCOUNTER — Encounter: Payer: Self-pay | Admitting: Neurology

## 2018-05-06 ENCOUNTER — Other Ambulatory Visit: Payer: Self-pay

## 2018-05-20 ENCOUNTER — Encounter: Payer: Self-pay | Admitting: Family

## 2018-06-07 ENCOUNTER — Ambulatory Visit: Payer: Self-pay | Admitting: Neurology

## 2018-06-09 ENCOUNTER — Encounter: Payer: Self-pay | Admitting: Family

## 2018-06-10 ENCOUNTER — Other Ambulatory Visit: Payer: Self-pay

## 2018-06-10 ENCOUNTER — Telehealth (INDEPENDENT_AMBULATORY_CARE_PROVIDER_SITE_OTHER): Payer: Self-pay | Admitting: Neurology

## 2018-06-10 ENCOUNTER — Encounter: Payer: Self-pay | Admitting: *Deleted

## 2018-06-10 ENCOUNTER — Encounter: Payer: Self-pay | Admitting: Neurology

## 2018-06-10 VITALS — Ht 64.0 in | Wt 120.0 lb

## 2018-06-10 DIAGNOSIS — R202 Paresthesia of skin: Secondary | ICD-10-CM

## 2018-06-10 DIAGNOSIS — R29898 Other symptoms and signs involving the musculoskeletal system: Secondary | ICD-10-CM

## 2018-06-10 DIAGNOSIS — Z720 Tobacco use: Secondary | ICD-10-CM

## 2018-06-10 NOTE — Progress Notes (Signed)
New Patient Virtual Visit via Video Note The purpose of this virtual visit is to provide medical care while limiting exposure to the novel coronavirus.    Consent was obtained for video visit:  Yes.   Answered questions that patient had about telehealth interaction:  Yes.   I discussed the limitations, risks, security and privacy concerns of performing an evaluation and management service by telemedicine. I also discussed with the patient that there may be a patient responsible charge related to this service. The patient expressed understanding and agreed to proceed.  Pt location: Home Physician Location: office Name of referring provider:  Veryl Speak, FNP I connected with Rodney Chase at patients initiation/request on 06/10/2018 at 11:00 AM EDT by video enabled telemedicine application and verified that I am speaking with the correct person using two identifiers. Pt MRN:  [id number] Pt DOB:  30-Oct-1975 Video Participants:  Rodney Chase    History of Present Illness: Rodney Chase is a 43 y.o. right-handed African American male with HIV and tobacco use presenting for evaluation of bilateral hand pain and weakness. Starting around 2018, he began experiencing numbness/tingling and weakness over the left > right arm.  When symptoms began, he was doing a lot of heavy lifting at [employer] and recalls that he would favor the right arm when moving heavy boxes.  He recalls having achy pain over the forearm and into the hands. He no longer works there and now at the [employer].  He continues to have tingling and burning sensation over the forearm which radiates into the hands.  He does wake up with his left hand falling asleep several times per week. He has difficulty opening jars and door knobs.   There is no associated neck pain.  Initially, he was having electrical shock-like sensation over the left abdomen/torso which would move up the chest and was associated with skin  hypersensitivity.  This has significantly improved.   He has history of HIV diagnosed in August 2007 and takes [name].  Out-side paper records, electronic medical record, and images have been reviewed where available and summarized as:  Lab Results  Component Value Date   HGBA1C 5.6 03/05/2018    Past Medical History:  Diagnosis Date   HIV (human immunodeficiency virus infection) (HCC)    Tobacco use     No past surgical history on file.   Medications:  Outpatient Encounter Medications as of 06/10/2018  Medication Sig   acetaminophen (TYLENOL) 500 MG tablet Take 1,000 mg by mouth every 6 (six) hours as needed for mild pain or headache.   bictegravir-emtricitabine-tenofovir AF (BIKTARVY) 50-200-25 MG TABS tablet Take 1 tablet by mouth daily.   ENSURE (ENSURE) Take 237 mLs by mouth 2 (two) times daily between meals.   ibuprofen (ADVIL,MOTRIN) 800 MG tablet Take 1 tablet (800 mg total) by mouth 3 (three) times daily.   No facility-administered encounter medications on file as of 06/10/2018.     Allergies:  Allergies  Allergen Reactions   Penicillins     Has patient had a PCN reaction causing immediate rash, facial/tongue/throat swelling, SOB or lightheadedness with hypotension: YES Has patient had a PCN reaction causing severe rash involving mucus membranes or skin necrosis: NO Has patient had a PCN reaction that required hospitalization: UNK Has patient had a PCN reaction occurring within the last 10 years: NO If all of the above answers are "NO", then may proceed with Cephalosporin use.    Family History: Family History  Problem Relation Age of Onset   Diabetes Daughter    Breast cancer Mother        breast   Cancer Maternal Grandmother    Diabetes Paternal Grandmother    Lung cancer Maternal Aunt     Social History: Social History   Tobacco Use   Smoking status: Current Every Day Smoker    Packs/day: 0.20    Types: Cigarettes   Smokeless tobacco:  Never Used  Substance Use Topics   Alcohol use: No    Alcohol/week: 0.0 standard drinks   Drug use: Yes    Types: Marijuana   Social History   Social History Narrative   Lives with roommate   He was working at the water plant treatment for [employer]   Highest level of education: 12th    Review of Systems:  CONSTITUTIONAL: No fevers, chills, night sweats, or weight loss.   EYES: No visual changes or eye pain ENT: No hearing changes.  No history of nose bleeds.   RESPIRATORY: No cough, wheezing and shortness of breath.   CARDIOVASCULAR: Negative for chest pain, and palpitations.   GI: Negative for abdominal discomfort, blood in stools or black stools.  No recent change in bowel habits.   GU:  No history of incontinence.   MUSCLOSKELETAL: No history of joint pain or swelling.  No myalgias.   SKIN: Negative for lesions, rash, and itching.   HEMATOLOGY/ONCOLOGY: Negative for prolonged bleeding, bruising easily, and swollen nodes.  No history of cancer.   ENDOCRINE: Negative for cold or heat intolerance, polydipsia or goiter.   PSYCH:  No depression or anxiety symptoms.   NEURO: As Above.   Vital Signs:  Ht 5\' 4"  (1.626 m)    Wt 120 lb (54.4 kg)    BMI 20.60 kg/m    General Medical Exam:  Well appearing, comfortable.  Nonlabored breathing.  No deformity or edema.  No rash.  Neurological Exam: MENTAL STATUS including orientation to time, place, person, recent and remote memory, attention span and concentration, language, and fund of knowledge is normal.  Speech is not dysarthric.  CRANIAL NERVES:  Normal conjugate, extra-ocular eye movements in all directions of gaze.  No ptosis.  Normal facial symmetry and movements.  Normal shoulder shrug and head rotation.  Tongue is midline.  MOTOR:  Antigravity in all extremities. There is no atrophy seen in the intrinsic hand muscles.  No abnormal movements.  No pronator drift.   SENSORY:  Positive Prayer testing on the right.      COORDINATION/GAIT: Normal finger to nose bilaterally.  Intact rapid alternating movements bilaterally.  Able to rise from a chair without using arms.  Gait narrow based and stable.   IMPRESSION/PLAN: 1.  Bilateral hand paresthesias and pain, worse on the left where there is associated weakness.  Symptoms may be due to tendonitis from overuse with superimposed entrapment neuropathy, such as ulnar neuropathy or carpal tunnel syndrome. Individuals with HIV can develop neuropathy but this is less likely given asymmetrical symptoms.  NCS/EMG of the arms will be ordered to better localize symptoms.  In the meantime, I discussed strategies to avoid compression of the nerve at the wrist and elbow, especially when sleeping. All questions were answered.   2.  Tobacco use.  Currently smoking 0.2 packs/day.  Patient was informed of the dangers of tobacco abuse including stroke, cancer, and MI, as well as benefits of tobacco cessation.Patient is not willing to quit at this time. Approximately 5 mins were  spent counseling patient cessation techniques. We discussed various methods to help quit smoking, including deciding on a date to quit, joining a support group, pharmacological agents- nicotine gum/patch/lozenges, chantix.  I will reassess his progress at the next follow-up visit   Follow Up Instructions:  I discussed the assessment and treatment plan with the patient. The patient was provided an opportunity to ask questions and all were answered. The patient agreed with the plan and demonstrated an understanding of the instructions.   The patient was advised to call back or seek an in-person evaluation if the symptoms worsen or if the condition fails to improve as anticipated.   Glendale Chard, DO

## 2018-06-14 ENCOUNTER — Other Ambulatory Visit: Payer: Self-pay | Admitting: *Deleted

## 2018-06-14 DIAGNOSIS — R202 Paresthesia of skin: Secondary | ICD-10-CM

## 2018-06-14 NOTE — Progress Notes (Signed)
Placed order

## 2018-06-16 ENCOUNTER — Encounter: Payer: Self-pay | Admitting: Neurology

## 2018-07-12 ENCOUNTER — Ambulatory Visit: Payer: Self-pay | Admitting: Neurology

## 2018-07-15 ENCOUNTER — Telehealth: Payer: Self-pay | Admitting: Family

## 2018-07-15 NOTE — Telephone Encounter (Signed)
COVID-19 Pre-Screening Questions: ° °Do you currently have a fever (>100 °F), chills or unexplained body aches? No  ° °Are you currently experiencing new cough, shortness of breath, sore throat, runny nose? No  °•  °Have you recently travelled outside the state of [place] in the last 14 days? No  °•  °Have you been in contact with someone that is currently pending confirmation of Covid19 testing or has been confirmed to have the Covid19 virus?  No  °

## 2018-07-18 ENCOUNTER — Encounter (HOSPITAL_COMMUNITY): Payer: Self-pay

## 2018-07-18 ENCOUNTER — Other Ambulatory Visit: Payer: Self-pay

## 2018-07-18 ENCOUNTER — Ambulatory Visit (HOSPITAL_COMMUNITY)
Admission: EM | Admit: 2018-07-18 | Discharge: 2018-07-18 | Disposition: A | Discharge status: Home / Self Care | Payer: Self-pay | Attending: Urgent Care | Admitting: Urgent Care

## 2018-07-18 DIAGNOSIS — M25512 Pain in left shoulder: Secondary | ICD-10-CM

## 2018-07-18 DIAGNOSIS — M62838 Other muscle spasm: Secondary | ICD-10-CM

## 2018-07-18 MED ORDER — NAPROXEN 500 MG PO TABS
500.0000 mg | ORAL_TABLET | Freq: Two times a day (BID) | ORAL | 0 refills | Status: DC
Start: 2018-07-18 — End: 2018-07-19

## 2018-07-18 MED ORDER — CYCLOBENZAPRINE HCL 10 MG PO TABS: 10.0000 mg | ORAL_TABLET | Freq: Every day | ORAL | 0 refills | Status: DC

## 2018-07-18 NOTE — ED Provider Notes (Signed)
MRN: [id number] DOB: [birth year]  Subjective:   Rodney Chase is a 43 y.o. male presenting for 6 day history of recurrent moderate achy left shoulder and arm pain.  Patient also has ongoing neck pain.  He had a cervical x-ray that was negative in 2018.  Patient reports that he does not drink as much water and drinks mostly juice.  He does a variety of jobs Building services engineer.  Admits that his work is strenuous.  Does not generally use medications for his symptoms.  Denies weakness, numbness or tingling, shooting pain, falls, trauma.  No current facility-administered medications for this encounter.   Current Outpatient Medications:    acetaminophen (TYLENOL) 500 MG tablet, Take 1,000 mg by mouth every 6 (six) hours as needed for mild pain or headache., Disp: , Rfl:    bictegravir-emtricitabine-tenofovir AF (BIKTARVY) 50-200-25 MG TABS tablet, Take 1 tablet by mouth daily., Disp: 30 tablet, Rfl: 5   ENSURE (ENSURE), Take 237 mLs by mouth 2 (two) times daily between meals., Disp: 237 mL, Rfl: 5   ibuprofen (ADVIL,MOTRIN) 800 MG tablet, Take 1 tablet (800 mg total) by mouth 3 (three) times daily., Disp: 21 tablet, Rfl: 0   Allergies  Allergen Reactions   Penicillins     Has patient had a PCN reaction causing immediate rash, facial/tongue/throat swelling, SOB or lightheadedness with hypotension: YES Has patient had a PCN reaction causing severe rash involving mucus membranes or skin necrosis: NO Has patient had a PCN reaction that required hospitalization: UNK Has patient had a PCN reaction occurring within the last 10 years: NO If all of the above answers are "NO", then may proceed with Cephalosporin use.    Past Medical History:  Diagnosis Date   HIV (human immunodeficiency virus infection) (HCC)    Tobacco use      History reviewed. No pertinent surgical history.  ROS  Objective:   Vitals: BP 114/81 (BP Location: Right Arm)    Pulse (!) 101    Temp 98.4  F (36.9 C) (Oral)    Resp 18    Wt 125 lb (56.7 kg)    SpO2 100%    BMI 21.46 kg/m   Physical Exam Constitutional:      Appearance: Normal appearance. He is well-developed and normal weight.  HENT:     Head: Normocephalic and atraumatic.     Right Ear: External ear normal.     Left Ear: External ear normal.     Nose: Nose normal.     Mouth/Throat:     Pharynx: Oropharynx is clear.  Eyes:     Extraocular Movements: Extraocular movements intact.     Pupils: Pupils are equal, round, and reactive to light.  Cardiovascular:     Rate and Rhythm: Normal rate.  Pulmonary:     Effort: Pulmonary effort is normal.  Musculoskeletal:     Left shoulder: He exhibits tenderness (Over lateral and posterior deltoids) and spasm. He exhibits normal range of motion, no bony tenderness, no swelling, no effusion, no crepitus, no deformity and normal strength.     Cervical back: He exhibits tenderness (With range of motion testing) and spasm (Along left trapezius as depicted). He exhibits normal range of motion, no bony tenderness, no swelling, no edema and no deformity.       Back:  Neurological:     Mental Status: He is alert and oriented to person, place, and time.  Psychiatric:        Mood and Affect: Mood  normal.        Behavior: Behavior normal.     Assessment and Plan :   Spasm of cervical paraspinous muscle  Trapezius muscle spasm  Acute pain of left shoulder  Counseled patient on need to drink water more instead of juice especially given the nature of his work.  In the meantime we will use conservative management with naproxen and Flexeril.  Counseled patient on appropriate back care and nature of muscle spasms. Counseled patient on potential for adverse effects with medications prescribed today, patient verbalized understanding. ER and return-to-clinic precautions discussed, patient verbalized understanding.    Wallis BambergMani, Melven Stockard, PA-C 07/18/18 1724

## 2018-07-18 NOTE — ED Triage Notes (Signed)
Pt states he has some body aches like muscle pain in his left shoulder 6 days.

## 2018-07-19 ENCOUNTER — Encounter: Payer: Self-pay | Admitting: Family

## 2018-07-19 ENCOUNTER — Ambulatory Visit (INDEPENDENT_AMBULATORY_CARE_PROVIDER_SITE_OTHER): Payer: Self-pay | Admitting: Family

## 2018-07-19 DIAGNOSIS — B20 Human immunodeficiency virus [HIV] disease: Secondary | ICD-10-CM

## 2018-07-19 DIAGNOSIS — F17210 Nicotine dependence, cigarettes, uncomplicated: Secondary | ICD-10-CM

## 2018-07-19 DIAGNOSIS — R630 Anorexia: Secondary | ICD-10-CM

## 2018-07-19 DIAGNOSIS — F17200 Nicotine dependence, unspecified, uncomplicated: Secondary | ICD-10-CM

## 2018-07-19 NOTE — Progress Notes (Signed)
Subjective:    Patient ID: Rodney Chase, male    DOB: 10-Feb-1976, 43 y.o.   MRN: [id number]  Chief Complaint  Patient presents with  . HIV Positive/AIDS     Virtual Visit via Telephone Note   I connected with Rodney Chase on 07/20/2018 at 4:30 PM by telephone and verified that I am speaking with the correct person using two identifiers.   I discussed the limitations, risks, security and privacy concerns of performing an evaluation and management service by telephone and the availability of in person appointments. I also discussed with the patient that there may be a patient responsible charge related to this service. The patient expressed understanding and agreed to proceed.   HPI:  Rodney Chase is a 43 y.o. male with HIV disease who was last seen in the office 03/22/2018 with good adherence and tolerance to his ART regimen of Biktarvy.  His viral load at the time was undetectable with previous CD4 count of 910.  Healthcare maintenance due includes Menveo.  Mr. [name] has been taking his Biktarvy as prescribed with no adverse side effects and 1-2 missed doses since his last office visit.  Overall he is feeling okay although has concerns because he is having difficulty having a large appetite indicating he weighs currently 115 pounds. Denies fevers, chills, night sweats, headaches, changes in vision, neck pain/stiffness, nausea, diarrhea, vomiting, lesions or rashes.  Mr. [name] is currently looking for work and unemployed.  He remains covered through [facility] and has no problems obtaining his medication from the pharmacy.  Denies feelings of being down, depressed, or hopeless recently.  He does continue to smoke marijuana daily and tobacco on occasion.  No alcohol consumption currently.  He is not currently sexually active.   Allergies  Allergen Reactions  . Penicillins     Has patient had a PCN reaction causing immediate rash, facial/tongue/throat swelling, SOB or lightheadedness with  hypotension: YES Has patient had a PCN reaction causing severe rash involving mucus membranes or skin necrosis: NO Has patient had a PCN reaction that required hospitalization: UNK Has patient had a PCN reaction occurring within the last 10 years: NO If all of the above answers are "NO", then may proceed with Cephalosporin use.      Outpatient Medications Prior to Visit  Medication Sig Dispense Refill  . bictegravir-emtricitabine-tenofovir AF (BIKTARVY) 50-200-25 MG TABS tablet Take 1 tablet by mouth daily. 30 tablet 5  . cyclobenzaprine (FLEXERIL) 10 MG tablet Take 1 tablet (10 mg total) by mouth at bedtime. 30 tablet 0  . naproxen (NAPROSYN) 500 MG tablet Take 1 tablet (500 mg total) by mouth 2 (two) times daily. 30 tablet 0  . acetaminophen (TYLENOL) 500 MG tablet Take 1,000 mg by mouth every 6 (six) hours as needed for mild pain or headache.    . ENSURE (ENSURE) Take 237 mLs by mouth 2 (two) times daily between meals. (Patient not taking: Reported on 07/19/2018) 237 mL 5  . ibuprofen (ADVIL,MOTRIN) 800 MG tablet Take 1 tablet (800 mg total) by mouth 3 (three) times daily. (Patient not taking: Reported on 07/19/2018) 21 tablet 0   No facility-administered medications prior to visit.      Past Medical History:  Diagnosis Date  . HIV (human immunodeficiency virus infection) (HCC)   . Tobacco use      History reviewed. No pertinent surgical history.     Review of Systems  Constitutional: Positive for unexpected weight change. Negative for appetite change, chills, fatigue  and fever.  Eyes: Negative for visual disturbance.  Respiratory: Negative for cough, chest tightness, shortness of breath and wheezing.   Cardiovascular: Negative for chest pain and leg swelling.  Gastrointestinal: Negative for abdominal pain, constipation, diarrhea, nausea and vomiting.  Genitourinary: Negative for dysuria, flank pain, frequency, genital sores, hematuria and urgency.  Skin: Negative for rash.   Allergic/Immunologic: Negative for immunocompromised state.  Neurological: Negative for dizziness and headaches.      Objective:    Nursing note and vital signs reviewed.    Rodney Chase is pleasant to speak with and sounds to be doing well overall. Assessment & Plan:   Problem List Items Addressed This Visit      Other   Human immunodeficiency virus (HIV) disease (HCC) - Primary    Rodney Chase has well-controlled HIV disease with good adherence and tolerance to his ART regimen of Biktarvy.  No symptoms of opportunistic infection or progressive HIV disease at present although does have difficulties maintaining his weight with decreased appetite.  Question if this is related to well-controlled HIV versus alternative diagnosis with differentials including endocrine related or malignancy related.  Continue current dose of Biktarvy.  Will need to renew UMAP in July.  Plan for follow-up in 3 months or sooner if needed with lab work at that appointment.      Relevant Medications   bictegravir-emtricitabine-tenofovir AF (BIKTARVY) 50-200-25 MG TABS tablet   LOSS OF APPETITE    Rodney Chase is concern about his weight with a normal BMI at present. Discussed eating small frequent meals with increased calories for weight gain. Discussed it is okay to eat only when hungry. If weight loss continues or does not stabilize would recommend further evaluation.       TOBACCO USER    Continues to be a some day smoker.  Discussed importance of tobacco cessation to reduce risk respiratory, malignancy, or cardiovascular disease in the future which would be exponentially increased with his HIV.  He is in the precontemplation stage of quitting.  Continue to monitor.          I have discontinued [name]'s acetaminophen, ibuprofen, Ensure, naproxen, and cyclobenzaprine. I am also having him maintain his bictegravir-emtricitabine-tenofovir AF.   Meds ordered this encounter  Medications  .  bictegravir-emtricitabine-tenofovir AF (BIKTARVY) 50-200-25 MG TABS tablet    Sig: Take 1 tablet by mouth daily.    Dispense:  30 tablet    Refill:  5    ADAP pending    Order Specific Question:   Supervising Provider    Answer:   Judyann MunsonSNIDER, CYNTHIA [phone][[id number]]     I discussed the assessment and treatment plan with the patient. The patient was provided an opportunity to ask questions and all were answered. The patient agreed with the plan and demonstrated an understanding of the instructions.   The patient was advised to call back or seek an in-person evaluation if the symptoms worsen or if the condition fails to improve as anticipated.   I provided 14  minutes of non-face-to-face time during this encounter.  Follow-up: Return in about 3 months (around 10/19/2018), or if symptoms worsen or fail to improve.   Marcos EkeGreg Marty Sadlowski, MSN, FNP-C Nurse Practitioner [facility] Main number: [phone]

## 2018-07-20 ENCOUNTER — Encounter: Payer: Self-pay | Admitting: Family

## 2018-07-20 MED ORDER — BICTEGRAVIR-EMTRICITAB-TENOFOV 50-200-25 MG PO TABS: 1.0000 | ORAL_TABLET | Freq: Every day | ORAL | 5 refills | Status: DC

## 2018-07-20 NOTE — Patient Instructions (Signed)
Nice to speak with you.  Please continue to take your [place] as prescribed.  Eat when hungry and try frequent small meals that are calorie dense.  You will need to renew UMAP in July.  Plan for follow up in 3 months or sooner if needed.

## 2018-07-20 NOTE — Assessment & Plan Note (Signed)
Mr. Salmons has well-controlled HIV disease with good adherence and tolerance to his ART regimen of Biktarvy.  No symptoms of opportunistic infection or progressive HIV disease at present although does have difficulties maintaining his weight with decreased appetite.  Question if this is related to well-controlled HIV versus alternative diagnosis with differentials including endocrine related or malignancy related.  Continue current dose of Biktarvy.  Will need to renew UMAP in July.  Plan for follow-up in 3 months or sooner if needed with lab work at that appointment.

## 2018-07-20 NOTE — Assessment & Plan Note (Signed)
Continues to be a some day smoker.  Discussed importance of tobacco cessation to reduce risk respiratory, malignancy, or cardiovascular disease in the future which would be exponentially increased with his HIV.  He is in the precontemplation stage of quitting.  Continue to monitor.

## 2018-07-20 NOTE — Assessment & Plan Note (Signed)
Rodney Chase is concern about his weight with a normal BMI at present. Discussed eating small frequent meals with increased calories for weight gain. Discussed it is okay to eat only when hungry. If weight loss continues or does not stabilize would recommend further evaluation.

## 2018-07-22 ENCOUNTER — Ambulatory Visit (INDEPENDENT_AMBULATORY_CARE_PROVIDER_SITE_OTHER): Payer: Self-pay | Admitting: Licensed Clinical Social Worker

## 2018-07-22 ENCOUNTER — Other Ambulatory Visit: Payer: Self-pay

## 2018-07-22 DIAGNOSIS — F331 Major depressive disorder, recurrent, moderate: Secondary | ICD-10-CM

## 2018-07-23 NOTE — Progress Notes (Signed)
Integrated Behavioral Health Visit via Telemedicine (Telephone)  07/23/2018 Rodney Chase [id number]   Session Start time: 2:00pm  Session End time: 2:30pm Total time: 30 minutes  Referring Provider: Marcos Eke Type of Visit: Telephonic Patient location: Patient's home [facility] Provider location: Counselor's home office All persons participating in visit: counselor, patient  Confirmed patient's address: Yes  Confirmed patient's phone number: Yes  Any changes to demographics: No   Confirmed patient's insurance: Yes  Any changes to patient's insurance: No   Discussed confidentiality: Yes    The following statements were read to the patient and/or legal guardian that are established with the [facility] Provider.  "The purpose of this phone visit is to provide behavioral health care while limiting exposure to the coronavirus (COVID19).  There is a possibility of technology failure and discussed alternative modes of communication if that failure occurs."  "By engaging in this telephone visit, you consent to the provision of healthcare.  Additionally, you authorize for your insurance to be billed for the services provided during this telephone visit."   Patient and/or legal guardian consented to telephone visit: Yes   PRESENTING CONCERNS: Patient and/or family reports the following symptoms/concerns: sadness, grief, no appetite, sleep changes, irritability, tendency to isolate Duration of problem: 1 month Severity of problem: moderate  STRENGTHS (Protective Factors/Coping Skills): Motivated, good supports, resilient  GOALS ADDRESSED: Patient will: 1.  Reduce symptoms of: depression   INTERVENTIONS: Interventions utilized:  Motivational Interviewing and Supportive Counseling  ASSESSMENT: Patient currently experiencing sadness, grief, appetite change, difficulty sleeping, irritability and a desire to isolate himself. He recently lost an aunt who had been like a mother to him  since his own mother's death. However, patient reports previous history of depressive episodes and states that his mood had worsened prior to his aunt's death. 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  Patient reports that the loss of his aunt has retriggered the feelings he had after losing his mother. Now he feels he is heavily grieving both of them at the same time. Patient reports that his depression has worsened to the point that he is angry all the time and does not want to engage in any of the things that usually bring him joy. Counselor guided patient to identify their goals for counseling. He identified that he would like to have extra support, as his family supports remind him of his losses. Patient also stated that he would like to feel more like himself again: increased energy and social interaction. Patient and counselor discussed ways that counseling might be beneficial in meeting these goals.   Patient may benefit from counseling twice a month.  PLAN: 1. Follow up with behavioral health clinician on : 08/02/18   Angus Palms

## 2018-07-27 ENCOUNTER — Ambulatory Visit (INDEPENDENT_AMBULATORY_CARE_PROVIDER_SITE_OTHER): Payer: Self-pay | Admitting: Neurology

## 2018-07-27 ENCOUNTER — Encounter: Payer: Self-pay | Admitting: Neurology

## 2018-07-27 ENCOUNTER — Other Ambulatory Visit: Payer: Self-pay

## 2018-07-27 VITALS — BP 114/68 | Ht 63.0 in | Wt 125.0 lb

## 2018-07-27 DIAGNOSIS — M542 Cervicalgia: Secondary | ICD-10-CM

## 2018-07-27 DIAGNOSIS — R202 Paresthesia of skin: Secondary | ICD-10-CM

## 2018-07-27 DIAGNOSIS — R292 Abnormal reflex: Secondary | ICD-10-CM

## 2018-07-27 DIAGNOSIS — G5623 Lesion of ulnar nerve, bilateral upper limbs: Secondary | ICD-10-CM

## 2018-07-27 NOTE — Progress Notes (Signed)
Follow-up Visit   Date: 07/27/18   Rodney Chase MRN: [id number] DOB: Jul 11, 1975   Interim History: Rodney Chase is a 43 y.o. right-handed African American male with HIV and tobacco use returning to the clinic for follow-up of bilateral hand pain and weakness.  The patient was accompanied to the clinic by self.  History of present illness: Starting around 2018, he began experiencing numbness/tingling and weakness over the left > right arm.  When symptoms began, he was doing a lot of heavy lifting at [employer] and recalls that he would favor the right arm when moving heavy boxes.  He recalls having achy pain over the forearm and into the hands. He no longer works there and now at the [employer].  He continues to have tingling and burning sensation over the forearm which radiates into the hands.  He does wake up with his left hand falling asleep several times per week. He has difficulty opening jars and door knobs.   There is no associated neck pain.  Initially, he was having electrical shock-like sensation over the left abdomen/torso which would move up the chest and was associated with skin hypersensitivity.  This has significantly improved.   He has history of HIV diagnosed in August 2007 and takes [place].  UPDATE 07/27/2018:  He is here for follow-up and EDX testing.  His arm pain and weakness is unchanged.  He continues to have tingling and burning in the forearm and hands.  He also reports increased sensitivity of the chest and abdomen on the left.  He has neck stiffness and was offered Flexeril by his PCP, however he has not picked this prescription up yet.  He has not done physical therapy for his neck or arm symptoms.  Medications:  Current Outpatient Medications on File Prior to Visit  Medication Sig Dispense Refill   bictegravir-emtricitabine-tenofovir AF (BIKTARVY) 50-200-25 MG TABS tablet Take 1 tablet by mouth daily. 30 tablet 5   No current  facility-administered medications on file prior to visit.     Allergies:  Allergies  Allergen Reactions   Penicillins     Has patient had a PCN reaction causing immediate rash, facial/tongue/throat swelling, SOB or lightheadedness with hypotension: YES Has patient had a PCN reaction causing severe rash involving mucus membranes or skin necrosis: NO Has patient had a PCN reaction that required hospitalization: UNK Has patient had a PCN reaction occurring within the last 10 years: NO If all of the above answers are "NO", then may proceed with Cephalosporin use.    Review of Systems:  CONSTITUTIONAL: No fevers, chills, night sweats, or weight loss.  EYES: No visual changes or eye pain ENT: No hearing changes.  No history of nose bleeds.   RESPIRATORY: No cough, wheezing and shortness of breath.   CARDIOVASCULAR: Negative for chest pain, and palpitations.   GI: Negative for abdominal discomfort, blood in stools or black stools.  No recent change in bowel habits.   GU:  No history of incontinence.   MUSCLOSKELETAL: No history of joint pain or swelling.  No myalgias.   SKIN: Negative for lesions, rash, and itching.   ENDOCRINE: Negative for cold or heat intolerance, polydipsia or goiter.   PSYCH:  No depression or anxiety symptoms.   NEURO: As Above.   Vital Signs:  BP 114/68    Ht 5\' 3"  (1.6 m)    Wt 125 lb (56.7 kg)    BMI 22.14 kg/m    General Medical  Exam:   General:  Well appearing, comfortable  Eyes/ENT: see cranial nerve examination.   Neck:  No carotid bruits. Respiratory:  Clear to auscultation, good air entry bilaterally.   Cardiac:  Regular rate and rhythm, no murmur.   Ext:  No edema   Neurological Exam: MENTAL STATUS including orientation to time, place, person, recent and remote memory, attention span and concentration, language, and fund of knowledge is normal.  Speech is not dysarthric.  CRANIAL NERVES:  No visual field defects.  Pupils equal round and reactive  to light.  Normal conjugate, extra-ocular eye movements in all directions of gaze.  No ptosis.  Face is symmetric. Palate elevates symmetrically.  Tongue is midline.  MOTOR:  Motor strength is 5/5 in all extremities.  No atrophy, fasciculations or abnormal movements.  No pronator drift.  Tone is normal.    MSRs:                                           Right        Left brachioradialis 3+  3+  biceps 2+  2+  triceps 2+  2+  patellar 2+  2+  ankle jerk 2+  2+  Hoffman no  no  plantar response down  down   SENSORY:  Intact to vibration and temperature throughout.  COORDINATION/GAIT:  Normal finger-to- nose-finger.  Intact rapid alternating movements bilaterally.  Gait narrow based and stable.   Data: NCS/EMG of bilateral arms 07/27/2018: Bilateral ulnar neuropathy with slowing across the elbow, purely demyelinating in type, and mild-moderate in degree electrically.  IMPRESSION/PLAN: 1. Bilateral cubital tunnel syndrome, mild-moderate.  This would explain his forearm paresthesias and hand weakness.  Patient endorses often sleeping or resting in a position which would compromise the ulnar nerve.  I discussed strategies to avoid compression of the nerve at the elbow, such as using a soft elbow pad.  He was also counseled on avoiding hyperflexion at the elbow and encouraged to try to sleep with arms extended.  2. Cervicalgia with hyperreflexia and C5 bilaterally.  I cannot exclude overlapping cervical canal stenosis.  There is no evidence of cervical radiculopathy on his EMG.  I will first start with neck PT and have encouraged him to take Flexeril at bedtime.  If there is no improvement, he will need MRI cervical spine wwo contrast.   Return to clinic in 4 months.   Time spent:  25 min  Thank you for allowing me to participate in patient's care.  If I can answer any additional questions, I would be pleased to do so.    Sincerely,    Sadik Piascik K. Allena Katz, DO

## 2018-07-27 NOTE — Procedures (Signed)
[facility]  [address] [place], [address]  [place], [place] [address] Tel: [phone] Fax:  [phone] Test Date:  07/27/2018  Patient: Rodney Chase DOB: 1975-05-28 Physician: Nita Sickle, DO  Sex: Male Height: 5\' 4"  Ref Phys: Nita Sickle, DO  ID#: [id number] Temp: 33.8C Technician:    Patient Complaints: This is a 43 year old man with HIV referred for evaluation of bilateral arm pain, weakness, and burning sensation in the forearm, which is worse on the left.  NCV & EMG Findings: Extensive electrodiagnostic testing of the right upper extremity and additional studies of the left shows:  1. Bilateral median, ulnar, and mixed palmar sensory responses are within normal limits. 2. Bilateral median motor responses are within normal limits.  Bilateral ulnar motor responses show slowed conduction velocity across the elbow at the abductor digit he minimi and first dorsal interosseous muscles, respectively (A Elbow-B Elbow, R40, R37, L37, L42 m/s).  3. Chronic motor axonal loss changes are isolated to the left first dorsal interosseous and abductor digiti minimi muscles.  There is no evidence of accompanied active denervation.   Impression: 1. Bilateral ulnar neuropathy with slowing across the elbow, which is purely demyelinating in type and mild-to-moderate in degree electrically. 2. There is no evidence of a cervical radiculopathy affecting the upper extremities.  ___________________________ Nita Sickle, DO    Nerve Conduction Studies Anti Sensory Summary Table   Site NR Peak (ms) Norm Peak (ms) P-T Amp (V) Norm P-T Amp  Left Median Anti Sensory (2nd Digit)  33.8C  Wrist    2.7 <3.4 49.7 >20  Right Median Anti Sensory (2nd Digit)  33.8C  Wrist    2.6 <3.4 45.7 >20  Left Ulnar Anti Sensory (5th Digit)  33.8C  Wrist    2.8 <3.1 35.8 >12  Right Ulnar Anti Sensory (5th Digit)  33.8C  Wrist    2.6 <3.1 31.8 >12   Motor Summary Table   Site NR Onset (ms) Norm Onset  (ms) O-P Amp (mV) Norm O-P Amp Site1 Site2 Delta-0 (ms) Dist (cm) Vel (m/s) Norm Vel (m/s)  Left Median Motor (Abd Poll Brev)  33.8C  Wrist    2.7 <3.9 14.2 >6 Elbow Wrist 4.6 27.0 59 >50  Elbow    7.3  14.0         Right Median Motor (Abd Poll Brev)  33.8C  Wrist    2.0 <3.9 14.4 >6 Elbow Wrist 5.3 28.0 53 >50  Elbow    7.3  14.4         Left Ulnar Motor (Abd Dig Minimi)  33.8C  Wrist    2.3 <3.1 10.8 >7 B Elbow Wrist 3.6 21.0 58 >50  B Elbow    5.9  10.0  A Elbow B Elbow 2.7 10.0 37 >50  A Elbow    8.6  9.9         Right Ulnar Motor (Abd Dig Minimi)  33.8C  Wrist    2.2 <3.1 11.2 >7 B Elbow Wrist 3.4 21.0 62 >50  B Elbow    5.6  10.4  A Elbow B Elbow 2.5 10.0 40 >50  A Elbow    8.1  10.2         Left Ulnar (FDI) Motor (1st DI)  33.8C  Wrist    3.9 <4.3 10.5 >7 B Elbow Wrist 3.6 21.0 58 >50  B Elbow    7.5  9.2  A Elbow B Elbow 2.4 10.0 42 >50  A Elbow  9.9  8.9         Right Ulnar (FDI) Motor (1st DI)  33.8C  Wrist    3.9 <4.3 12.2 >7 B Elbow Wrist 3.7 21.0 57 >50  B Elbow    7.6  11.5  A Elbow B Elbow 2.7 10.0 37 >50  A Elbow    10.3  11.5          Comparison Summary Table   Site NR Peak (ms) Norm Peak (ms) P-T Amp (V) Site1 Site2 Delta-P (ms) Norm Delta (ms)  Left Median/Ulnar Palm Comparison (Wrist - 8cm)  33.8C  Median Palm    1.4 <2.2 42.4 Median Palm Ulnar Palm 0.3   Ulnar Palm    1.7 <2.2 9.6      Right Median/Ulnar Palm Comparison (Wrist - 8cm)  33.8C  Median Palm    1.6 <2.2 37.0 Median Palm Ulnar Palm 0.1   Ulnar Palm    1.5 <2.2 13.9       EMG   Side Muscle Ins Act Fibs Psw Fasc Number Recrt Dur Dur. Amp Amp. Poly Poly. Comment  Right 1stDorInt Nml Nml Nml Nml Nml Nml Nml Nml Nml Nml Nml Nml N/A  Right PronatorTeres Nml Nml Nml Nml Nml Nml Nml Nml Nml Nml Nml Nml N/A  Right Biceps Nml Nml Nml Nml Nml Nml Nml Nml Nml Nml Nml Nml N/A  Right Triceps Nml Nml Nml Nml Nml Nml Nml Nml Nml Nml Nml Nml N/A  Right Deltoid Nml Nml Nml Nml Nml Nml Nml Nml Nml Nml  Nml Nml N/A  Left 1stDorInt Nml Nml Nml Nml 1- Rapid Few 1+ Few 1+ Nml Nml N/A  Left PronatorTeres Nml Nml Nml Nml Nml Nml Nml Nml Nml Nml Nml Nml N/A  Left Biceps Nml Nml Nml Nml Nml Nml Nml Nml Nml Nml Nml Nml N/A  Left Triceps Nml Nml Nml Nml Nml Nml Nml Nml Nml Nml Nml Nml N/A  Left Deltoid Nml Nml Nml Nml Nml Nml Nml Nml Nml Nml Nml Nml N/A  Left ABD Dig Min Nml Nml Nml Nml 1- Rapid Few 1+ Few 1+ Nml Nml N/A  Left FlexCarpiUln Nml Nml Nml Nml Nml Nml Nml Nml Nml Nml Nml Nml N/A  Right ABD Dig Min Nml Nml Nml Nml Nml Nml Nml Nml Nml Nml Nml Nml N/A  Right FlexCarpiUln Nml Nml Nml Nml Nml Nml Nml Nml Nml Nml Nml Nml N/A      Waveforms:

## 2018-08-02 ENCOUNTER — Ambulatory Visit (INDEPENDENT_AMBULATORY_CARE_PROVIDER_SITE_OTHER): Payer: Self-pay | Admitting: Licensed Clinical Social Worker

## 2018-08-02 ENCOUNTER — Other Ambulatory Visit: Payer: Self-pay

## 2018-08-02 DIAGNOSIS — F331 Major depressive disorder, recurrent, moderate: Secondary | ICD-10-CM

## 2018-08-02 NOTE — BH Specialist Note (Signed)
Integrated Behavioral Health Visit via Telemedicine (Telephone)  08/02/2018 [name] [id number]   Session Start time:  2:00pm Session End time: 2:30pm Total time: 30 minutes  Type of Visit: Telephonic Patient location: patient's home [facility] Provider location: counselor's home office All persons participating in visit: counselor and patient   Confirmed patient's address: Yes  Confirmed patient's phone number: Yes  Any changes to demographics: No   Confirmed patient's insurance: Yes  Any changes to patient's insurance: No   Discussed confidentiality: Yes   The following statements were read to the patient and/or legal guardian that are established with the [facility] Provider.  "The purpose of this phone visit is to provide behavioral health care while limiting exposure to the coronavirus (COVID19).  There is a possibility of technology failure and discussed alternative modes of communication if that failure occurs."  "By engaging in this telephone visit, you consent to the provision of healthcare.  Additionally, you authorize for your insurance to be billed for the services provided during this telephone visit."   Patient and/or legal guardian consented to telephone visit: Yes   PRESENTING CONCERNS: Patient and/or family reports the following symptoms/concerns: sadness, feeling overwhelmed, anxiety/worry thoughts, lack of energy, isolating, avoidance, and irritability.   STRENGTHS (Protective Factors/Coping Skills): Resilient, motivated for growth, intelligent, good support system  GOALS ADDRESSED: Patient will: 1.  Reduce symptoms of: depression   INTERVENTIONS: Interventions utilized:  Brief CBT and Supportive Counseling  ASSESSMENT: Patient currently experiencing sadness, feeling overwhelmed, anxiety/worry thoughts, lack of energy, isolating, avoidance, and irritability. He reports that he has not yet been able to go over to late aunt's house, though he would like  to be able to in order to visit with his uncle, especially now that cousin overdosed and may not live through it. Counselor guided patient to process his thoughts and feelings about cousin's overdose. Patient expresses confusion over whether he thinks the overdose was accidental or a suicide attempt. Either way, he states, he often has thoughts of cousin not making it through and becomes more depressed. Counselor explored with patient what this feels like. Patient indicates that he tries to keep busy, but sometimes when he does not have have something to focus on (esepcailly while driving) he loses concentration and finds himself thinking about just running away or crashing into the back of the car in front of him. Patient describes these as intrusive thoughts that only last a second and affirms that he has no desire to do so. Counselor expressed concern that patient is avoiding dealing with his pain and therefore it is overwhelming him in situations where he cannot get away from it. Counselor emphasized the danger of this, especially while driving, and educated patient on safety planning and crisis measures. Patient and counselor discussed ways that patient can express his emotions in a more controlled and productive way. Counselor encouraged patient to write, talk to supportive people, and schedule time for grieving.   Patient may benefit from weekly supportive grief therapy sessions.  PLAN: 1. Follow up with behavioral health clinician on : 08/09/2018  [place]

## 2018-08-31 ENCOUNTER — Telehealth: Payer: Self-pay | Admitting: *Deleted

## 2018-08-31 ENCOUNTER — Telehealth: Payer: Self-pay | Admitting: Neurology

## 2018-08-31 NOTE — Telephone Encounter (Signed)
Patient had some test done last week needs to talk to someone about that

## 2018-08-31 NOTE — Telephone Encounter (Signed)
Attempted to call patient back about his question but no answer and unable to leave a message.  Will try again later.

## 2018-08-31 NOTE — Telephone Encounter (Signed)
Patient called to report that since his nerve conduction test he has had days where there is pain for no reason. He reports that yesterday 08/30/18 it was so bad he could not move and he wants to let Marya Amsler know what is going on. Asked if he had called the neurology office and he advised no. Reminded him we sent him there because we cant treat "nerve" issues and asked him to call there to let them know what is going on to see if there is something they can do. He advised will do and call us back to schedule his follow up later.

## 2018-09-01 NOTE — Telephone Encounter (Signed)
Attempted to contact patient again to follow up on his question but unable to leave message.

## 2018-09-06 NOTE — Telephone Encounter (Signed)
Spoke with patient and he is going to follow up with physical therapy.

## 2018-09-06 NOTE — Telephone Encounter (Signed)
Patient was returning your call and said to call back at new # [phone]. Thanks!

## 2018-09-06 NOTE — Telephone Encounter (Signed)
Patient calling with concerns of his nerve pain. Advised patient to call [facility] [place] at  [phone] for additional advise.   Eugenia Mcalpine, LPN

## 2018-09-29 ENCOUNTER — Encounter: Payer: Self-pay | Admitting: Family

## 2018-10-07 ENCOUNTER — Other Ambulatory Visit: Payer: Self-pay

## 2018-10-07 ENCOUNTER — Ambulatory Visit: Payer: Self-pay

## 2018-10-07 DIAGNOSIS — B20 Human immunodeficiency virus [HIV] disease: Secondary | ICD-10-CM

## 2018-10-07 DIAGNOSIS — Z79899 Other long term (current) drug therapy: Secondary | ICD-10-CM

## 2018-10-07 DIAGNOSIS — Z113 Encounter for screening for infections with a predominantly sexual mode of transmission: Secondary | ICD-10-CM

## 2018-10-28 ENCOUNTER — Telehealth: Payer: Self-pay

## 2018-10-28 NOTE — Telephone Encounter (Signed)
COVID-19 Pre-Screening Questions:10/28/18  Do you currently have a fever (>100 F), chills or unexplained body aches? NO  Are you currently experiencing new cough, shortness of breath, sore throat, runny nose? NO  .  Have you recently travelled outside the state of [place] in the last 14 days? NO  .  Have you been in contact with someone that is currently pending confirmation of Covid19 testing or has been confirmed to have the Covid19 virus?  NO   **If the patient answers NO to ALL questions -  advise the patient to please call the clinic before coming to the office should any symptoms develop.     

## 2018-11-01 ENCOUNTER — Ambulatory Visit: Payer: Self-pay | Admitting: Family

## 2018-11-04 ENCOUNTER — Ambulatory Visit: Payer: Self-pay | Admitting: Family

## 2018-11-25 ENCOUNTER — Encounter: Payer: Self-pay | Admitting: Neurology

## 2018-11-29 ENCOUNTER — Ambulatory Visit (INDEPENDENT_AMBULATORY_CARE_PROVIDER_SITE_OTHER): Payer: Self-pay | Admitting: Neurology

## 2018-11-29 DIAGNOSIS — Z5329 Procedure and treatment not carried out because of patient's decision for other reasons: Secondary | ICD-10-CM

## 2018-11-30 NOTE — Progress Notes (Signed)
No show

## 2018-12-05 ENCOUNTER — Other Ambulatory Visit: Payer: Self-pay

## 2018-12-05 ENCOUNTER — Emergency Department (HOSPITAL_BASED_OUTPATIENT_CLINIC_OR_DEPARTMENT_OTHER): Payer: Self-pay

## 2018-12-05 ENCOUNTER — Emergency Department (HOSPITAL_BASED_OUTPATIENT_CLINIC_OR_DEPARTMENT_OTHER)
Admission: EM | Admit: 2018-12-05 | Discharge: 2018-12-06 | Disposition: A | Discharge status: Home / Self Care | Payer: Self-pay | Attending: Emergency Medicine | Admitting: Emergency Medicine

## 2018-12-05 ENCOUNTER — Encounter (HOSPITAL_BASED_OUTPATIENT_CLINIC_OR_DEPARTMENT_OTHER): Payer: Self-pay | Admitting: *Deleted

## 2018-12-05 DIAGNOSIS — F17210 Nicotine dependence, cigarettes, uncomplicated: Secondary | ICD-10-CM | POA: Insufficient documentation

## 2018-12-05 DIAGNOSIS — R0602 Shortness of breath: Secondary | ICD-10-CM | POA: Insufficient documentation

## 2018-12-05 DIAGNOSIS — R1032 Left lower quadrant pain: Secondary | ICD-10-CM | POA: Insufficient documentation

## 2018-12-05 DIAGNOSIS — Z20828 Contact with and (suspected) exposure to other viral communicable diseases: Secondary | ICD-10-CM | POA: Insufficient documentation

## 2018-12-05 DIAGNOSIS — I10 Essential (primary) hypertension: Secondary | ICD-10-CM | POA: Insufficient documentation

## 2018-12-05 DIAGNOSIS — R109 Unspecified abdominal pain: Secondary | ICD-10-CM

## 2018-12-05 LAB — CBC WITH DIFFERENTIAL/PLATELET
Abs Immature Granulocytes: 0.01 10*3/uL (ref 0.00–0.07)
Basophils Absolute: 0 10*3/uL (ref 0.0–0.1)
Basophils Relative: 0 %
Eosinophils Absolute: 0.1 10*3/uL (ref 0.0–0.5)
Eosinophils Relative: 2 %
HCT: 41.8 % (ref 39.0–52.0)
Hemoglobin: 13.3 g/dL (ref 13.0–17.0)
Immature Granulocytes: 0 %
Lymphocytes Relative: 54 %
Lymphs Abs: 3 10*3/uL (ref 0.7–4.0)
MCH: 27 pg (ref 26.0–34.0)
MCHC: 31.8 g/dL (ref 30.0–36.0)
MCV: 84.8 fL (ref 80.0–100.0)
Monocytes Absolute: 0.5 10*3/uL (ref 0.1–1.0)
Monocytes Relative: 9 %
Neutro Abs: 1.9 10*3/uL (ref 1.7–7.7)
Neutrophils Relative %: 35 %
Platelets: 175 10*3/uL (ref 150–400)
RBC: 4.93 MIL/uL (ref 4.22–5.81)
RDW: 14.4 % (ref 11.5–15.5)
WBC: 5.5 10*3/uL (ref 4.0–10.5)
nRBC: 0 % (ref 0.0–0.2)

## 2018-12-05 MED ORDER — OXYCODONE-ACETAMINOPHEN 5-325 MG PO TABS
1.0000 | ORAL_TABLET | Freq: Once | ORAL | Status: AC
  Administered 2018-12-05: 1 via ORAL
  Filled 2018-12-05: qty 1

## 2018-12-05 NOTE — ED Notes (Signed)
Pt aware we need urine specimen, but states unable to provide sample at this time. Specimen cup provided for pt

## 2018-12-05 NOTE — ED Provider Notes (Signed)
[facility] EMERGENCY DEPARTMENT Provider Note   CSN: [id number] Arrival date & time: 12/05/18  2225     History   Chief Complaint Chief Complaint  Patient presents with  . Flank Pain    HPI Rodney Chase is a 43 y.o. male.     HPI  This a 43 year old male with a history of HIV on antiretrovirals who presents with left flank pain and shortness of breath.  Patient reports he has had some left flank pain and discomfort for the last 2 to 3 weeks.  He reports tenderness with the slightest touch or change of temperature.  He has not noted any rash.  He did not injure himself.  He does states that sometimes the pain is worse when he moves a certain way.  Currently his pain is 5 out of 10.  He has not taken anything for his pain.  Additionally, he reports over the last 2 days he has had increasing shortness of breath.  No coughs.  He states he did have a coworker that was positive for COVID but has otherwise been taking precautions.  He denies loss of sense of taste and smell.  He denies chest pain, abdominal pain, nausea, vomiting, diarrhea.  Past Medical History:  Diagnosis Date  . HIV (human immunodeficiency virus infection) (HCC)   . Tobacco use     Patient Active Problem List   Diagnosis Date Noted  . Elevated blood sugar level 03/05/2018  . Spasm of cervical paraspinous muscle 01/05/2018  . Essential hypertension 11/01/2014  . Hidradenitis suppurativa 11/01/2014  . Poor dentition 10/19/2013  . 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 Lymphadenopathy, inguinal 01/21/2013  . Onychomycosis due to dermatophyte 02/04/2011  . TOBACCO USER 04/01/2010  . 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 Anxiety state 03/02/2007  . EPIGASTRIC PAIN 11/10/2006  . Human immunodeficiency virus (HIV) disease (HCC) 03/27/2006  . HEADACHE 03/27/2006    History reviewed. No pertinent surgical history.      Home Medications    Prior to Admission medications   Medication Sig Start Date End Date Taking?  Authorizing Provider  bictegravir-emtricitabine-tenofovir AF (BIKTARVY) 50-200-25 MG TABS tablet Take 1 tablet by mouth daily. 07/20/18   Veryl Speakalone, Gregory D, FNP  naproxen (NAPROSYN) 500 MG tablet Take 1 tablet (500 mg total) by mouth 2 (two) times daily. 12/06/18   Horton, Mayer Maskerourtney F, MD    Family History Family History  Problem Relation Age of Onset  . Diabetes Daughter   . Breast cancer Mother        breast  . Cancer Maternal Grandmother   . Diabetes Paternal Grandmother   . Lung cancer Maternal Aunt     Social History Social History   Tobacco Use  . Smoking status: Current Every Day Smoker    Packs/day: 0.20    Types: Cigarettes  . Smokeless tobacco: Never Used  Substance Use Topics  . Alcohol use: No    Alcohol/week: 0.0 standard drinks  . Drug use: Yes    Types: Marijuana     Allergies   Penicillins   Review of Systems Review of Systems  Constitutional: Negative for fever.  Respiratory: Positive for shortness of breath. Negative for cough.   Cardiovascular: Negative for chest pain.  Gastrointestinal: Negative for abdominal pain, diarrhea, nausea and vomiting.  Genitourinary: Positive for flank pain. Negative for dysuria.  Skin: Negative for rash.  Neurological: Negative for numbness.  All other systems reviewed and are negative.    Physical Exam Updated Vital Signs  BP (!) 131/99 (BP Location: Right Arm)   Pulse 77   Temp 98.1 F (36.7 C) (Oral)   Resp 18   Ht 1.575 m (5\' 2" )   Wt 59 kg   SpO2 99%   BMI 23.78 kg/m   Physical Exam Vitals signs and nursing note reviewed.  Constitutional:      Appearance: He is well-developed. He is not ill-appearing or toxic-appearing.     Comments: Thin  HENT:     Head: Normocephalic and atraumatic.     Mouth/Throat:     Mouth: Mucous membranes are moist.  Eyes:     Pupils: Pupils are equal, round, and reactive to light.  Neck:     Musculoskeletal: Neck supple.  Cardiovascular:     Rate and Rhythm: Normal  rate and regular rhythm.     Heart sounds: Normal heart sounds. No murmur.  Pulmonary:     Effort: Pulmonary effort is normal. No respiratory distress.     Breath sounds: Normal breath sounds. No wheezing.     Comments: Tenderness palpation left lower chest wall, no crepitus, no overlying skin changes, no rash Chest:     Chest wall: Tenderness present.  Abdominal:     General: Bowel sounds are normal.     Palpations: Abdomen is soft.     Tenderness: There is no abdominal tenderness. There is no right CVA tenderness, left CVA tenderness or rebound.  Musculoskeletal:     Right lower leg: No edema.     Left lower leg: No edema.  Lymphadenopathy:     Cervical: No cervical adenopathy.  Skin:    General: Skin is warm and dry.     Findings: No rash.  Neurological:     Mental Status: He is alert and oriented to person, place, and time.  Psychiatric:        Mood and Affect: Mood normal.      ED Treatments / Results  Labs (all labs ordered are listed, but only abnormal results are displayed) Labs Reviewed  BASIC METABOLIC PANEL - Abnormal; Notable for the following components:      Result Value   Glucose, Bld 124 (*)    Calcium 8.8 (*)    All other components within normal limits  SARS CORONAVIRUS 2 (HOSPITAL ORDER, [place] [facility])  URINALYSIS, ROUTINE W REFLEX MICROSCOPIC  CBC WITH DIFFERENTIAL/PLATELET    EKG None  Radiology Dg Chest Portable 1 View  Result Date: 12/05/2018 CLINICAL DATA:  Shortness of breath EXAM: PORTABLE CHEST 1 VIEW COMPARISON:  08/23/2017 FINDINGS: The heart size and mediastinal contours are within normal limits. Both lungs are clear. The visualized skeletal structures are unremarkable. IMPRESSION: No active disease. Electronically Signed   By: Donavan Foil M.D.   On: 12/05/2018 23:40    Procedures Procedures (including critical care time)  Medications Ordered in ED Medications  oxyCODONE-acetaminophen (PERCOCET/ROXICET)  5-325 MG per tablet 1 tablet (1 tablet Oral Given 12/05/18 2337)     Initial Impression / Assessment and Plan / ED Course  I have reviewed the triage vital signs and the nursing notes.  Pertinent labs & imaging results that were available during my care of the patient were reviewed by me and considered in my medical decision making (see chart for details).        Patient presents with left flank pain.  Additionally he reports recent shortness of breath.  History of HIV and immunosuppression.  He is overall nontoxic and vital signs are  reassuring.  On exam there is no evidence of crepitus or overlying skin changes of the left flank.  He has no obvious rash.  He is tender out of proportion on exam.  Question prodrome to shingles although timeframe would be long for that.  His pulmonary exam is reassuring.  He is afebrile.  Low risk for COVID although he is immunosuppressed.  Chest x-ray without any pneumothorax or pneumonia.  Lab work-up is largely reassuring and his white count is normal.  COVID testing is negative.  Patient reassured.  Naproxen as needed for pain.  Rodney Chase was evaluated in Emergency Department on 12/06/2018 for the symptoms described in the history of present illness. He was evaluated in the context of the global COVID-19 pandemic, which necessitated consideration that the patient might be at risk for infection with the SARS-CoV-2 virus that causes COVID-19. Institutional protocols and algorithms that pertain to the evaluation of patients at risk for COVID-19 are in a state of rapid change based on information released by regulatory bodies including the CDC and federal and state organizations. These policies and algorithms were followed during the patient's care in the ED.  After history, exam, and medical workup I feel the patient has been appropriately medically screened and is safe for discharge home. Pertinent diagnoses were discussed with the patient. Patient was given  return precautions.   Final Clinical Impressions(s) / ED Diagnoses   Final diagnoses:  Left flank pain  SOB (shortness of breath)    ED Discharge Orders         Ordered    naproxen (NAPROSYN) 500 MG tablet  2 times daily     12/06/18 0042           Horton, Mayer Masker, MD 12/06/18 [phone]

## 2018-12-05 NOTE — ED Notes (Signed)
Pt denies fever at this time, however states he has "had fever recently but not now".

## 2018-12-05 NOTE — ED Triage Notes (Signed)
Pt reports he is HIV +, has been off his meds. States he has been fatigued and feels SOB x 2 days. Wants Covid test. Also c/o skin sensitivity over his left flank for "weeks". States painful when his shirt touches his skin

## 2018-12-05 NOTE — ED Notes (Signed)
Pt denies dysuria but reports odiferous urine

## 2018-12-05 NOTE — ED Notes (Signed)
Asked patient to provide urine sample. 

## 2018-12-06 LAB — URINALYSIS, ROUTINE W REFLEX MICROSCOPIC
Bilirubin Urine: NEGATIVE
Glucose, UA: NEGATIVE mg/dL
Hgb urine dipstick: NEGATIVE
Ketones, ur: NEGATIVE mg/dL
Leukocytes,Ua: NEGATIVE
Nitrite: NEGATIVE
Protein, ur: NEGATIVE mg/dL
Specific Gravity, Urine: 1.025 (ref 1.005–1.030)
pH: 6 (ref 5.0–8.0)

## 2018-12-06 LAB — BASIC METABOLIC PANEL
Anion gap: 9 (ref 5–15)
BUN: 12 mg/dL (ref 6–20)
CO2: 25 mmol/L (ref 22–32)
Calcium: 8.8 mg/dL — ABNORMAL LOW (ref 8.9–10.3)
Chloride: 104 mmol/L (ref 98–111)
Creatinine, Ser: 0.9 mg/dL (ref 0.61–1.24)
GFR calc Af Amer: >60 mL/min (ref >60)
GFR calc non Af Amer: >60 mL/min (ref >60)
Glucose, Bld: 124 mg/dL — ABNORMAL HIGH (ref 70–99)
Potassium: 3.6 mmol/L (ref 3.5–5.1)
Sodium: 138 mmol/L (ref 135–145)

## 2018-12-06 LAB — SARS CORONAVIRUS 2 BY RT PCR (HOSPITAL ORDER, PERFORMED IN ~~LOC~~ HOSPITAL LAB): SARS Coronavirus 2: NEGATIVE

## 2018-12-06 MED ORDER — NAPROXEN 500 MG PO TABS: 500.0000 mg | ORAL_TABLET | Freq: Two times a day (BID) | ORAL | 0 refills | Status: DC

## 2018-12-06 NOTE — ED Notes (Signed)
Prompted again for urine sample.

## 2018-12-06 NOTE — Discharge Instructions (Addendum)
You were seen today for left flank pain.  The cause of this pain at this time is unknown.  You do not appear to have a urinary tract infection.  Your chest x-ray is reassuring.  Sometimes you can get pain as a prodrome to shingles although the time course which do not suggest this.  If you develop a rash, you need to follow-up closely with your primary physician for antivirals.  Your COVID testing was negative.

## 2018-12-08 ENCOUNTER — Other Ambulatory Visit: Payer: Self-pay

## 2018-12-28 ENCOUNTER — Encounter: Payer: Self-pay | Admitting: Family

## 2019-01-17 ENCOUNTER — Other Ambulatory Visit: Payer: Self-pay

## 2019-01-31 ENCOUNTER — Ambulatory Visit: Payer: Self-pay | Admitting: Family

## 2019-03-23 ENCOUNTER — Ambulatory Visit (INDEPENDENT_AMBULATORY_CARE_PROVIDER_SITE_OTHER): Payer: Self-pay | Admitting: Pharmacist

## 2019-03-23 ENCOUNTER — Other Ambulatory Visit: Payer: Self-pay

## 2019-03-23 ENCOUNTER — Encounter: Payer: Self-pay | Admitting: Family

## 2019-03-23 ENCOUNTER — Telehealth: Payer: Self-pay | Admitting: Pharmacy Technician

## 2019-03-23 DIAGNOSIS — Z79899 Other long term (current) drug therapy: Secondary | ICD-10-CM

## 2019-03-23 DIAGNOSIS — B20 Human immunodeficiency virus [HIV] disease: Secondary | ICD-10-CM

## 2019-03-23 DIAGNOSIS — Z113 Encounter for screening for infections with a predominantly sexual mode of transmission: Secondary | ICD-10-CM

## 2019-03-23 MED ORDER — BIKTARVY 50-200-25 MG PO TABS: 1.0000 | ORAL_TABLET | Freq: Every day | ORAL | 3 refills | Status: DC

## 2019-03-23 NOTE — Telephone Encounter (Signed)
[facility] Patient Advocate Encounter    Findings of the benefits investigation   Insurance: uninsured and covered by [employer] until 06/01/2019  [facility] Patient Advocate will follow up once patient arrives for their appointment if they need any assistance with medication.  Beulah Gandy, CPhT Specialty Pharmacy Patient [facility] Phone: [phone] Fax: [phone] 03/23/2019 8:16 AM

## 2019-03-23 NOTE — Progress Notes (Signed)
HPI: Rodney Chase is a 44 y.o. male who presents to the [place] clinic for HIV follow-up.  Patient Active Problem List   Diagnosis Date Noted  . Elevated blood sugar level 03/05/2018  . Spasm of cervical paraspinous muscle 01/05/2018  . Essential hypertension 11/01/2014  . Hidradenitis suppurativa 11/01/2014  . Poor dentition 10/19/2013  . 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 Lymphadenopathy, inguinal 01/21/2013  . Onychomycosis due to dermatophyte 02/04/2011  . TOBACCO USER 04/01/2010  . 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 Anxiety state 03/02/2007  . EPIGASTRIC PAIN 11/10/2006  . Human immunodeficiency virus (HIV) disease ([place]) 03/27/2006  . HEADACHE 03/27/2006    Patient's Medications  New Prescriptions   No medications on file  Previous Medications   NAPROXEN (NAPROSYN) 500 MG TABLET    Take 1 tablet (500 mg total) by mouth 2 (two) times daily.  Modified Medications   Modified Medication Previous Medication   BICTEGRAVIR-EMTRICITABINE-TENOFOVIR AF (BIKTARVY) 50-200-25 MG TABS TABLET bictegravir-emtricitabine-tenofovir AF (BIKTARVY) 50-200-25 MG TABS tablet      Take 1 tablet by mouth daily.    Take 1 tablet by mouth daily.  Discontinued Medications   No medications on file    Allergies: Allergies  Allergen Reactions  . Penicillins     Has patient had a PCN reaction causing immediate rash, facial/tongue/throat swelling, SOB or lightheadedness with hypotension: YES Has patient had a PCN reaction causing severe rash involving mucus membranes or skin necrosis: NO Has patient had a PCN reaction that required hospitalization: UNK Has patient had a PCN reaction occurring within the last 10 years: NO If all of the above answers are "NO", then may proceed with Cephalosporin use.    Past Medical History: Past Medical History:  Diagnosis Date  . HIV (human immunodeficiency virus infection) ([place])   . Tobacco use     Social History: Social History   Socioeconomic History  .  Marital status: Single    Spouse name: Not on file  . Number of children: Not on file  . Years of education: Not on file  . Highest education level: Not on file  Occupational History  . Not on file  Tobacco Use  . Smoking status: Current Every Day Smoker    Packs/day: 0.20    Types: Cigarettes  . Smokeless tobacco: Never Used  Substance and Sexual Activity  . Alcohol use: No    Alcohol/week: 0.0 standard drinks  . Drug use: Yes    Types: Marijuana  . Sexual activity: Yes    Partners: Male    Birth control/protection: Condom    Comment: pt. given condoms  Other Topics Concern  . Not on file  Social History Narrative   Lives with roommate   He was working at the water plant treatment for [employer] level of education: 12th   Social Determinants of Health   Financial Resource Strain:   . Difficulty of Paying Living Expenses: Not on file  Food Insecurity:   . Worried About Charity fundraiser in the Last Year: Not on file  . Ran Out of Food in the Last Year: Not on file  Transportation Needs:   . Lack of Transportation (Medical): Not on file  . Lack of Transportation (Non-Medical): Not on file  Physical Activity:   . Days of Exercise per Week: Not on file  . Minutes of Exercise per Session: Not on file  Stress:   . Feeling of Stress : Not on file  Social Connections:   . Frequency of Communication with Friends and Family: Not on file  . Frequency of Social Gatherings with Friends and Family: Not on file  . Attends Religious Services: Not on file  . Active Member of Clubs or Organizations: Not on file  . Attends Banker Meetings: Not on file  . Marital Status: Not on file    Labs: Lab Results  Component Value Date   HIV1RNAQUANT <20 NOT DETECTED 03/05/2018   HIV1RNAQUANT 126 (H) 01/05/2018   HIV1RNAQUANT <20 DETECTED (A) 10/13/2017   CD4TABS 910 10/13/2017   CD4TABS 730 06/26/2017   CD4TABS 1,400 01/06/2017    RPR and STI Lab  Results  Component Value Date   LABRPR NON-REACTIVE 01/05/2018   LABRPR NON-REACTIVE 06/26/2017   LABRPR NON-REACTIVE 01/06/2017   LABRPR NON REAC 12/28/2015   LABRPR NON REAC 04/05/2015    STI Results GC CT  06/26/2017 Negative Negative  01/31/2016 Negative Negative  04/05/2015 Negative Negative  06/16/2013 NG: Negative CT: Negative  05/26/2013 NG: Negative CT: Negative    Hepatitis B Lab Results  Component Value Date   HEPBSAB NONREACTIVE 09/06/2012   HEPBSAG No 04/27/2006   Hepatitis C No results found for: HEPCAB, HCVRNAPCRQN Hepatitis A Lab Results  Component Value Date   HAV NEG 09/06/2012   Lipids: Lab Results  Component Value Date   CHOL 136 06/26/2017   TRIG 86 06/26/2017   HDL 41 06/26/2017   CHOLHDL 3.3 06/26/2017   VLDL 15 12/28/2015   LDLCALC 78 06/26/2017    Current HIV Regimen: Biktarvy  Assessment: Rodney Chase presents to clinic today for HIV follow-up. Patient was last seen by in May 2020 and has missed many appointments. He endorsed having difficulties in his life earlier this year with family members dying and his dad was diagnosed with cancer 6 months ago but is now eager to get back on track with treatment.   Patient endorsed only taking Biktarvy 3 times per week to conserve the rest of his supply. Counseled patient on the possibility of resistance to [facility] by taking the medication this way and emphasized the importance of calling the clinic if he begins to run low on his medication so he can consistently take it every single day. Will order a resistance panel. Last HIV viral load in January 2020 was undetectable, last CD4 in August 2019 was 980. Patient last had a CMET and CBC in October that was normal. Will have follow-up labs done today to assess viral load, CD4, RPR, and urine cytology. Patient requested condoms and they were provided.   Plan: - HIV RNA, CD4, RPR, urine cytology, resistance panel - Biktarvy x3 months  - Follow up with Rodney Chase  in clinic 2/23 at 11:30am  Wess Botts, PharmD Candidate [facility] 03/23/2019, 11:59 AM

## 2019-03-24 LAB — T-HELPER CELL (CD4) - (RCID CLINIC ONLY)
CD4 % Helper T Cell: 25 % — ABNORMAL LOW (ref 33–65)
CD4 T Cell Abs: 564 /uL (ref 400–1790)

## 2019-03-24 LAB — URINE CYTOLOGY ANCILLARY ONLY
Chlamydia: NEGATIVE
Comment: NEGATIVE
Comment: NORMAL
Neisseria Gonorrhea: NEGATIVE

## 2019-03-29 LAB — RPR: RPR Ser Ql: NONREACTIVE

## 2019-03-29 LAB — HIV RNA, RTPCR W/R GT (RTI, PI,INT)

## 2019-03-30 ENCOUNTER — Other Ambulatory Visit: Payer: Self-pay

## 2019-03-30 DIAGNOSIS — B20 Human immunodeficiency virus [HIV] disease: Secondary | ICD-10-CM

## 2019-03-30 NOTE — Addendum Note (Signed)
Addended by: Mariea Clonts D on: 03/30/2019 10:20 AM   Modules accepted: Orders

## 2019-03-30 NOTE — Progress Notes (Signed)
HIV

## 2019-03-30 NOTE — Addendum Note (Signed)
Addended by: Mariea Clonts D on: 03/30/2019 10:13 AM   Modules accepted: Orders

## 2019-03-30 NOTE — Addendum Note (Signed)
Addended by: Valarie Cones on: 03/30/2019 10:18 AM   Modules accepted: Orders

## 2019-04-09 LAB — HIV-1 INTEGRASE GENOTYPE

## 2019-04-09 LAB — HIV RNA, RTPCR W/R GT (RTI, PI,INT)
HIV 1 RNA Quant: 55100 copies/mL — ABNORMAL HIGH
HIV-1 RNA Quant, Log: 4.74 Log copies/mL — ABNORMAL HIGH

## 2019-04-09 LAB — HIV-1 GENOTYPE: HIV-1 Genotype: DETECTED — AB

## 2019-04-19 ENCOUNTER — Other Ambulatory Visit: Payer: Self-pay

## 2019-04-19 ENCOUNTER — Emergency Department (HOSPITAL_BASED_OUTPATIENT_CLINIC_OR_DEPARTMENT_OTHER)
Admission: EM | Admit: 2019-04-19 | Discharge: 2019-04-19 | Disposition: A | Discharge status: Home / Self Care | Payer: Self-pay | Attending: Emergency Medicine | Admitting: Emergency Medicine

## 2019-04-19 ENCOUNTER — Encounter (HOSPITAL_BASED_OUTPATIENT_CLINIC_OR_DEPARTMENT_OTHER): Payer: Self-pay

## 2019-04-19 ENCOUNTER — Telehealth: Payer: Self-pay

## 2019-04-19 DIAGNOSIS — N492 Inflammatory disorders of scrotum: Secondary | ICD-10-CM | POA: Insufficient documentation

## 2019-04-19 DIAGNOSIS — Z21 Asymptomatic human immunodeficiency virus [HIV] infection status: Secondary | ICD-10-CM | POA: Insufficient documentation

## 2019-04-19 DIAGNOSIS — F17210 Nicotine dependence, cigarettes, uncomplicated: Secondary | ICD-10-CM | POA: Insufficient documentation

## 2019-04-19 DIAGNOSIS — Z79899 Other long term (current) drug therapy: Secondary | ICD-10-CM | POA: Insufficient documentation

## 2019-04-19 DIAGNOSIS — I10 Essential (primary) hypertension: Secondary | ICD-10-CM | POA: Insufficient documentation

## 2019-04-19 MED ORDER — DOXYCYCLINE HYCLATE 100 MG PO CAPS: 100.0000 mg | ORAL_CAPSULE | Freq: Two times a day (BID) | ORAL | 0 refills | Status: DC

## 2019-04-19 MED ORDER — LIDOCAINE-EPINEPHRINE (PF) 2 %-1:200000 IJ SOLN
10.0000 mL | Freq: Once | INTRAMUSCULAR | Status: AC
  Administered 2019-04-19: 10 mL
  Filled 2019-04-19: qty 10

## 2019-04-19 MED FILL — DOXYCYCLINE HYCLATE 100 MG: 100 | 7 days supply | Qty: 14 | Fill #0

## 2019-04-19 NOTE — Discharge Instructions (Signed)
Your abscess was drained today, please take antibiotics as directed.  Continue with warm compresses and soaks to promote drainage and healing.  If you have increasing pain, increasing drainage, you start having swelling again or any other new or concerning symptoms please return to the emergency department, otherwise follow-up with your regular doctor.

## 2019-04-19 NOTE — ED Provider Notes (Signed)
[facility] EMERGENCY DEPARTMENT Provider Note   CSN: [id number] Arrival date & time: 04/19/19  1245     History Chief Complaint  Patient presents with  . Abscess    Veronica Guerrant is a 44 y.o. male.  Severin Bou is a 44 y.o. male with a history of HIV, who presents to the ED for evaluation of abscess to the right scrotum.  He reports its been present for about 2 days has become increasingly swollen and tender.  He states that the entire scrotum itself does not have swelling and is well localized to one area.  Denies any testicular pain or masses.  No dysuria or urinary frequency.  He has not had any fevers or chills.  No nausea or vomiting.  He states he has been taking all of his HIV medications regularly.  States he has had a previous abscess to his underarm that he had drained at [facility] ED, had 1 smaller abscess to his scrotum a few months ago that opened and drained on its own.  He does shave in this area and wonders if it could be causing these.  Patient is not diabetic.        Past Medical History:  Diagnosis Date  . HIV (human immunodeficiency virus infection) (HCC)   . Tobacco use     Patient Active Problem List   Diagnosis Date Noted  . Elevated blood sugar level 03/05/2018  . Spasm of cervical paraspinous muscle 01/05/2018  . Essential hypertension 11/01/2014  . Hidradenitis suppurativa 11/01/2014  . Poor dentition 10/19/2013  . 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 Lymphadenopathy, inguinal 01/21/2013  . Onychomycosis due to dermatophyte 02/04/2011  . TOBACCO USER 04/01/2010  . 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 Anxiety state 03/02/2007  . EPIGASTRIC PAIN 11/10/2006  . Human immunodeficiency virus (HIV) disease (HCC) 03/27/2006  . HEADACHE 03/27/2006    History reviewed. No pertinent surgical history.     Family History  Problem Relation Age of Onset  . Diabetes Daughter   . Breast cancer Mother        breast  . Cancer Maternal Grandmother     . Diabetes Paternal Grandmother   . Lung cancer Maternal Aunt     Social History   Tobacco Use  . Smoking status: Current Every Day Smoker    Packs/day: 0.20    Types: Cigarettes  . Smokeless tobacco: Never Used  Substance Use Topics  . Alcohol use: No    Alcohol/week: 0.0 standard drinks  . Drug use: Yes    Types: Marijuana    Home Medications Prior to Admission medications   Medication Sig Start Date End Date Taking? Authorizing Provider  bictegravir-emtricitabine-tenofovir AF (BIKTARVY) 50-200-25 MG TABS tablet Take 1 tablet by mouth daily. 03/23/19   Kuppelweiser, Cassie L, RPH-CPP  doxycycline (VIBRAMYCIN) 100 MG capsule Take 1 capsule (100 mg total) by mouth 2 (two) times daily. One po bid x 7 days 04/19/19   Dartha Lodge, PA-C  naproxen (NAPROSYN) 500 MG tablet Take 1 tablet (500 mg total) by mouth 2 (two) times daily. 12/06/18   Horton, Mayer Masker, MD    Allergies    Penicillins  Review of Systems   Review of Systems  Constitutional: Negative for chills and fever.  Gastrointestinal: Negative for nausea and vomiting.  Genitourinary: Positive for scrotal swelling (abscess). Negative for dysuria, frequency and testicular pain.  Skin: Negative for rash.    Physical Exam Updated Vital Signs BP 116/76 (BP Location: Left  Arm)   Pulse 94   Temp 98.3 F (36.8 C) (Oral)   Resp 18   Ht 5\' 4"  (1.626 m)   Wt 56.7 kg   SpO2 100%   BMI 21.46 kg/m   Physical Exam Vitals and nursing note reviewed.  Constitutional:      General: He is not in acute distress.    Appearance: Normal appearance. He is well-developed and normal weight. He is not diaphoretic.     Comments: Well-appearing and in no distress.  HENT:     Head: Normocephalic and atraumatic.  Eyes:     General:        Right eye: No discharge.        Left eye: No discharge.  Pulmonary:     Effort: Pulmonary effort is normal. No respiratory distress.  Genitourinary:    Comments: Chaperone present. Swelling  and fluctuance noted to the skin over the right side of the scrotum, this appears to be superficial there is no induration or fluctuance of the scrotal sac, no testicular masses noted.  Overlying erythema with small head noted to the abscess but no expressible drainage.  No surrounding cellulitis.  No crepitus to suggest Fournier's. Penis normal without discharge.  Skin:    General: Skin is warm and dry.  Neurological:     Mental Status: He is alert.     Coordination: Coordination normal.  Psychiatric:        Mood and Affect: Mood normal.        Behavior: Behavior normal.     ED Results / Procedures / Treatments   Labs (all labs ordered are listed, but only abnormal results are displayed) Labs Reviewed - No data to display  EKG None  Radiology No results found.  Procedures . Incision and Drainage  Date/Time: 04/19/2019 3:53 PM Performed by: 04/21/2019, PA-C Authorized by: Dartha Lodge, PA-C   Consent:    Consent obtained:  Verbal   Consent given by:  Patient   Risks discussed:  Bleeding, incomplete drainage, pain, infection and damage to other organs   Alternatives discussed:  No treatment Location:    Type:  Abscess   Size:  3 cm   Location:  Anogenital   Anogenital location:  Scrotal space (Dermal abscess over the scrotum) Pre-procedure details:    Skin preparation:  Chloraprep Anesthesia (see MAR for exact dosages):    Anesthesia method:  Local infiltration   Local anesthetic:  Lidocaine 2% WITH epi Procedure type:    Complexity:  Simple Procedure details:    Incision types:  Single straight   Incision depth:  Dermal   Scalpel blade:  11   Wound management:  Probed and deloculated   Drainage:  Bloody and purulent   Drainage amount:  Copious   Wound treatment:  Wound left open   Packing materials:  None Post-procedure details:    Patient tolerance of procedure:  Tolerated well, no immediate complications   (including critical care  time)  Medications Ordered in ED Medications  lidocaine-EPINEPHrine (XYLOCAINE W/EPI) 2 %-1:200000 (PF) injection 10 mL (has no administration in time range)    ED Course  I have reviewed the triage vital signs and the nursing notes.  Pertinent labs & imaging results that were available during my care of the patient were reviewed by me and considered in my medical decision making (see chart for details).    MDM Rules/Calculators/A&P  Patient with skin abscess on the scrotum amenable to incision and drainage.  Abscess was not large enough to warrant packing or drain,  wound recheck in 2 days if nto improving, return precautions discussed.  Encouraged home warm soaks and flushing.  Mild signs of cellulitis is surrounding skin.  Even location and patient's history of HIV will place on antibiotics.  Will d/c to home.    Final Clinical Impression(s) / ED Diagnoses Final diagnoses:  Scrotal wall abscess    Rx / DC Orders ED Discharge Orders         Ordered    doxycycline (VIBRAMYCIN) 100 MG capsule  2 times daily     04/19/19 1552           Jacqlyn Larsen, [place] 04/19/19 1559    Virgel Manifold, MD 04/20/19 [phone]

## 2019-04-19 NOTE — Telephone Encounter (Signed)
Patient called office today requesting same day appointment regarding bump on hand and difficulty walking. States this has been going off and on.  Has not established primary care yet. Is interested in establishing care. Advised patient to go to urgent care/ ED regarding concerns to be evaluated. Will have someone take him today. Will send mychart message with PCP options. Rodney Chase, [place]

## 2019-04-19 NOTE — ED Triage Notes (Signed)
Pt c/o right groin abscess x 2 days-NAD-steady gait

## 2019-04-26 ENCOUNTER — Ambulatory Visit: Payer: Self-pay | Admitting: Pharmacist

## 2019-05-31 ENCOUNTER — Other Ambulatory Visit: Payer: Self-pay

## 2019-05-31 ENCOUNTER — Telehealth: Payer: Self-pay | Admitting: *Deleted

## 2019-05-31 DIAGNOSIS — B20 Human immunodeficiency virus [HIV] disease: Secondary | ICD-10-CM

## 2019-05-31 DIAGNOSIS — L0292 Furuncle, unspecified: Secondary | ICD-10-CM

## 2019-05-31 LAB — COMPREHENSIVE METABOLIC PANEL
AG Ratio: 1.4 (calc) (ref 1.0–2.5)
ALT: 9 U/L (ref 9–46)
AST: 15 U/L (ref 10–40)
Albumin: 4.1 g/dL (ref 3.6–5.1)
Alkaline phosphatase (APISO): 45 U/L (ref 36–130)
BUN: 16 mg/dL (ref 7–25)
CO2: 29 mmol/L (ref 20–32)
Calcium: 9.3 mg/dL (ref 8.6–10.3)
Chloride: 106 mmol/L (ref 98–110)
Creat: 0.87 mg/dL (ref 0.60–1.35)
Globulin: 3 g/dL (calc) (ref 1.9–3.7)
Glucose, Bld: 87 mg/dL (ref 65–99)
Potassium: 4.1 mmol/L (ref 3.5–5.3)
Sodium: 139 mmol/L (ref 135–146)
Total Bilirubin: 0.4 mg/dL (ref 0.2–1.2)
Total Protein: 7.1 g/dL (ref 6.1–8.1)

## 2019-05-31 LAB — CBC WITH DIFFERENTIAL/PLATELET
Absolute Monocytes: 484 cells/uL (ref 200–950)
Basophils Absolute: 18 cells/uL (ref 0–200)
Basophils Relative: 0.3 %
Eosinophils Absolute: 30 cells/uL (ref 15–500)
Eosinophils Relative: 0.5 %
HCT: 42.1 % (ref 38.5–50.0)
Hemoglobin: 13.8 g/dL (ref 13.2–17.1)
Lymphs Abs: 2679 cells/uL (ref 850–3900)
MCH: 27.7 pg (ref 27.0–33.0)
MCHC: 32.8 g/dL (ref 32.0–36.0)
MCV: 84.5 fL (ref 80.0–100.0)
MPV: 9.1 fL (ref 7.5–12.5)
Monocytes Relative: 8.2 %
Neutro Abs: 2690 cells/uL (ref 1500–7800)
Neutrophils Relative %: 45.6 %
Platelets: 220 10*3/uL (ref 140–400)
RBC: 4.98 10*6/uL (ref 4.20–5.80)
RDW: 14.4 % (ref 11.0–15.0)
Total Lymphocyte: 45.4 %
WBC: 5.9 10*3/uL (ref 3.8–10.8)

## 2019-05-31 MED ORDER — DOXYCYCLINE HYCLATE 100 MG PO CAPS: 100.0000 mg | ORAL_CAPSULE | Freq: Two times a day (BID) | ORAL | 0 refills | Status: DC

## 2019-05-31 NOTE — Telephone Encounter (Signed)
Agree with plan. Thanks

## 2019-05-31 NOTE — Telephone Encounter (Signed)
Patient asked to speak with a nurse after his lab visit. He has not been taking biktarvy regularly, misses multiple doses a week.  He feels that his depression has impacted his appetite/daily schedule, including taking his meds.  The only regular thing in his life right now is helping an older friend. He stays there frequently, cooks and cleans for him, takes him to medical appointments. RN counseled patient, connected him to [facility] for counseling when he is scheduled to see Tammy Sours on 4/20.  RN gave him a pill box, advised him to try taking his medication when his friend does.  RN reminded him that Susanne Borders does not require food. Patient's boil has returned (lanced at ED in February 2021). He did not complete doxycycline for this.  He would like a refill.  OK per Tammy Sours, as long as patient will go to ED for evaluation if the boil worsens. Patient scheduled to follow up 4/20 with Tammy Sours and Marylu Lund.  He will check his viral load then to see how well he does over the next 3 weeks. Andree Coss, RN

## 2019-06-20 ENCOUNTER — Telehealth: Payer: Self-pay

## 2019-06-20 NOTE — Telephone Encounter (Signed)
COVID-19 Pre-Screening Questions:06/20/19   Do you currently have a fever (>100 F), chills or unexplained body aches? NO  Are you currently experiencing new cough, shortness of breath, sore throat, runny nose?NO .  Have you recently travelled outside the state of [place] in the last 14 days? NO .  Have you been in contact with someone that is currently pending confirmation of Covid19 testing or has been confirmed to have the Covid19 virus? NO  **If the patient answers NO to ALL questions -  advise the patient to please call the clinic before coming to the office should any symptoms develop.     

## 2019-06-21 ENCOUNTER — Ambulatory Visit (INDEPENDENT_AMBULATORY_CARE_PROVIDER_SITE_OTHER): Payer: Self-pay | Admitting: Family

## 2019-06-21 ENCOUNTER — Encounter: Payer: Self-pay | Admitting: Family

## 2019-06-21 ENCOUNTER — Other Ambulatory Visit: Payer: Self-pay

## 2019-06-21 ENCOUNTER — Ambulatory Visit: Payer: Self-pay

## 2019-06-21 VITALS — BP 135/89 | HR 85 | Ht 62.0 in | Wt 122.4 lb

## 2019-06-21 DIAGNOSIS — B20 Human immunodeficiency virus [HIV] disease: Secondary | ICD-10-CM

## 2019-06-21 DIAGNOSIS — Z113 Encounter for screening for infections with a predominantly sexual mode of transmission: Secondary | ICD-10-CM

## 2019-06-21 DIAGNOSIS — Z0000 Encounter for general adult medical examination without abnormal findings: Secondary | ICD-10-CM

## 2019-06-21 MED ORDER — BIKTARVY 50-200-25 MG PO TABS: 1.0000 | ORAL_TABLET | Freq: Every day | ORAL | 3 refills | Status: DC

## 2019-06-21 NOTE — Patient Instructions (Signed)
Nice to see you.  We will continue your current dose of Biktarvy daily.  Refills have been sent to the pharmacy.   Plan for follow up in 6 weeks with lab work 1-2 weeks prior to your appointment.   Have a great day and stay safe!

## 2019-06-21 NOTE — Assessment & Plan Note (Signed)
   Vaccines held today with possibility of COVD vaccination.  Discussed importance of safe sexual practice to reduce risk of STI. Condoms provided.

## 2019-06-21 NOTE — Progress Notes (Signed)
Subjective:    Patient ID: Rodney Chase, male    DOB: 1975/11/06, 44 y.o.   MRN: [id number]  Chief Complaint  Patient presents with  . Follow-up     HPI:  Rodney Chase is a 44 y.o. male with HIV disease who was last seen in the office for routine follow-up on 07/19/2018 with good adherence and tolerance to his ART regimen of Biktarvy.  His viral load on 03/05/2018 was undetectable with CD4 count previously being 910.  Most recent blood work completed on 03/23/2019 with a CD4 count of 564 and viral load of 55,100.  Genotype performed with no significant resistance and showing wild-type virus.  [name] has improved adherence and good tolerance to his ART regimen of Biktarvy.  Overall feeling well today with no new concerns/complaints. Denies fevers, chills, night sweats, headaches, changes in vision, neck pain/stiffness, nausea, diarrhea, vomiting, lesions or rashes.  [name] has no problems obtaining his medication from the pharmacy and remains covered through [employer].  Denies feelings of being down, depressed, or hopeless recently.  Smokes marijuana multiple times throughout the day and uses tobacco daily.  Alcohol as on occasion.  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  He is using the pillbox and reminder ideas to help improve his medication adherence. Sexually active and does use condoms and requesting them.  Allergies  Allergen Reactions  . Penicillins     Has patient had a PCN reaction causing immediate rash, facial/tongue/throat swelling, SOB or lightheadedness with hypotension: YES Has patient had a PCN reaction causing severe rash involving mucus membranes or skin necrosis: NO Has patient had a PCN reaction that required hospitalization: UNK Has patient had a PCN reaction occurring within the last 10 years: NO If all of the above answers are "NO", then may proceed with Cephalosporin use.      Outpatient Medications Prior to Visit  Medication Sig  Dispense Refill  . bictegravir-emtricitabine-tenofovir AF (BIKTARVY) 50-200-25 MG TABS tablet Take 1 tablet by mouth daily. 30 tablet 3  . doxycycline (VIBRAMYCIN) 100 MG capsule Take 1 capsule (100 mg total) by mouth 2 (two) times daily. One po bid x 7 days (Patient not taking: Reported on 06/21/2019) 14 capsule 0  . naproxen (NAPROSYN) 500 MG tablet Take 1 tablet (500 mg total) by mouth 2 (two) times daily. (Patient not taking: Reported on 06/21/2019) 30 tablet 0   No facility-administered medications prior to visit.     Past Medical History:  Diagnosis Date  . HIV (human immunodeficiency virus infection) (HCC)   . Tobacco use      History reviewed. No pertinent surgical history.     Review of Systems  Constitutional: Negative for appetite change, chills, fatigue, fever and unexpected weight change.  Eyes: Negative for visual disturbance.  Respiratory: Negative for cough, chest tightness, shortness of breath and wheezing.   Cardiovascular: Negative for chest pain and leg swelling.  Gastrointestinal: Negative for abdominal pain, constipation, diarrhea, nausea and vomiting.  Genitourinary: Negative for dysuria, flank pain, frequency, genital sores, hematuria and urgency.  Skin: Negative for rash.  Allergic/Immunologic: Negative for immunocompromised state.  Neurological: Negative for dizziness and headaches.      Objective:    BP 135/89 (BP Location: Right Arm, Patient Position: Sitting)   Pulse 85   Ht 5\' 2"  (1.575 m)   Wt 122 lb 6.4 oz (55.5 kg)   SpO2 99%   BMI 22.39 kg/m  Nursing note and vital signs reviewed.  Physical Exam Constitutional:      General: He is not in acute distress.    Appearance: He is well-developed.  Eyes:     Conjunctiva/sclera: Conjunctivae normal.  Cardiovascular:     Rate and Rhythm: Normal rate and regular rhythm.     Heart sounds: Normal heart sounds. No murmur. No friction rub. No gallop.   Pulmonary:     Effort: Pulmonary effort is  normal. No respiratory distress.     Breath sounds: Normal breath sounds. No wheezing or rales.  Chest:     Chest wall: No tenderness.  Abdominal:     General: Bowel sounds are normal.     Palpations: Abdomen is soft.     Tenderness: There is no abdominal tenderness.  Musculoskeletal:     Cervical back: Neck supple.  Lymphadenopathy:     Cervical: No cervical adenopathy.  Skin:    General: Skin is warm and dry.     Findings: No rash.  Neurological:     Mental Status: He is alert and oriented to person, place, and time.  Psychiatric:        Behavior: Behavior normal.        Thought Content: Thought content normal.        Judgment: Judgment normal.      Depression screen [facility] 2/9 07/19/2018 01/05/2018 07/08/2017 01/06/2017 01/31/2016  Decreased Interest 0 0 3 0 0  Down, Depressed, Hopeless 0 0 3 1 1   PHQ - 2 Score 0 0 6 1 1   Altered sleeping - - - - -  Tired, decreased energy - - - - -  Change in appetite - - - - -  Feeling bad or failure about yourself  - - - - -  Trouble concentrating - - - - -  Moving slowly or fidgety/restless - - - - -  Suicidal thoughts - - - - -  PHQ-9 Score - - - - -       Assessment & Plan:    Patient Active Problem List   Diagnosis Date Noted  . Healthcare maintenance 06/21/2019  . Elevated blood sugar level 03/05/2018  . Spasm of cervical paraspinous muscle 01/05/2018  . Essential hypertension 11/01/2014  . Hidradenitis suppurativa 11/01/2014  . Poor dentition 10/19/2013  . 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 Lymphadenopathy, inguinal 01/21/2013  . Onychomycosis due to dermatophyte 02/04/2011  . TOBACCO USER 04/01/2010  . 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 Anxiety state 03/02/2007  . EPIGASTRIC PAIN 11/10/2006  . Human immunodeficiency virus (HIV) disease (HCC) 03/27/2006  . HEADACHE 03/27/2006     Problem List Items Addressed This Visit      Other   Human immunodeficiency virus (HIV) disease (HCC) - Primary    [name] appears to have  improved adherence to his ART regimen of Biktarvy given previous viral load of 55,100 and less than optimal adherence.  He is using pillboxes and reminders.  No signs/symptoms of opportunistic infection or progressive HIV disease.  We reviewed his previous lab work and discussed the plan of care.  Encouraged to continue taking Biktarvy daily as prescribed.  Refill sent to the pharmacy.  We will check lab work in 4 weeks with follow-up office visit in 6 or sooner if needed.      Relevant Medications   bictegravir-emtricitabine-tenofovir AF (BIKTARVY) 50-200-25 MG TABS tablet   Other Relevant Orders   HIV-1 RNA quant-no reflex-bld   T-helper cell (CD4)- ([facility] clinic only)   Healthcare maintenance  Vaccines held today with possibility of COVD vaccination.  Discussed importance of safe sexual practice to reduce risk of STI. Condoms provided.        Other Visit Diagnoses    Screening for STDs (sexually transmitted diseases)       Relevant Orders   RPR   Urine cytology ancillary only([facility])       I have discontinued Luka Capley's naproxen and doxycycline. I am also having him maintain his Biktarvy.   Meds ordered this encounter  Medications  . bictegravir-emtricitabine-tenofovir AF (BIKTARVY) 50-200-25 MG TABS tablet    Sig: Take 1 tablet by mouth daily.    Dispense:  30 tablet    Refill:  3    Order Specific Question:   Supervising Provider    Answer:   Carlyle Basques [[id number]]     Follow-up: Return in about 6 weeks (around 08/02/2019), or if symptoms worsen or fail to improve.   Terri Piedra, MSN, FNP-C Nurse Practitioner [facility] [place] number: [phone]

## 2019-06-21 NOTE — Assessment & Plan Note (Signed)
Rodney Chase appears to have improved adherence to his ART regimen of Biktarvy given previous viral load of 55,100 and less than optimal adherence.  He is using pillboxes and reminders.  No signs/symptoms of opportunistic infection or progressive HIV disease.  We reviewed his previous lab work and discussed the plan of care.  Encouraged to continue taking Biktarvy daily as prescribed.  Refill sent to the pharmacy.  We will check lab work in 4 weeks with follow-up office visit in 6 or sooner if needed.

## 2019-07-02 ENCOUNTER — Emergency Department (HOSPITAL_BASED_OUTPATIENT_CLINIC_OR_DEPARTMENT_OTHER)
Admission: EM | Admit: 2019-07-02 | Discharge: 2019-07-02 | Disposition: A | Discharge status: Home / Self Care | Payer: Self-pay | Attending: Emergency Medicine | Admitting: Emergency Medicine

## 2019-07-02 ENCOUNTER — Encounter (HOSPITAL_BASED_OUTPATIENT_CLINIC_OR_DEPARTMENT_OTHER): Payer: Self-pay | Admitting: *Deleted

## 2019-07-02 ENCOUNTER — Other Ambulatory Visit: Payer: Self-pay

## 2019-07-02 DIAGNOSIS — Z23 Encounter for immunization: Secondary | ICD-10-CM | POA: Insufficient documentation

## 2019-07-02 DIAGNOSIS — W269XXA Contact with unspecified sharp object(s), initial encounter: Secondary | ICD-10-CM | POA: Insufficient documentation

## 2019-07-02 DIAGNOSIS — B20 Human immunodeficiency virus [HIV] disease: Secondary | ICD-10-CM | POA: Insufficient documentation

## 2019-07-02 DIAGNOSIS — S61411A Laceration without foreign body of right hand, initial encounter: Secondary | ICD-10-CM | POA: Insufficient documentation

## 2019-07-02 DIAGNOSIS — Z79899 Other long term (current) drug therapy: Secondary | ICD-10-CM | POA: Insufficient documentation

## 2019-07-02 DIAGNOSIS — Y999 Unspecified external cause status: Secondary | ICD-10-CM | POA: Insufficient documentation

## 2019-07-02 DIAGNOSIS — F17210 Nicotine dependence, cigarettes, uncomplicated: Secondary | ICD-10-CM | POA: Insufficient documentation

## 2019-07-02 DIAGNOSIS — Y9389 Activity, other specified: Secondary | ICD-10-CM | POA: Insufficient documentation

## 2019-07-02 DIAGNOSIS — Y92810 Car as the place of occurrence of the external cause: Secondary | ICD-10-CM | POA: Insufficient documentation

## 2019-07-02 MED ORDER — TETANUS-DIPHTH-ACELL PERTUSSIS 5-2.5-18.5 LF-MCG/0.5 IM SUSP
0.5000 mL | Freq: Once | INTRAMUSCULAR | Status: AC
  Administered 2019-07-02: 01:00:00 0.5 mL via INTRAMUSCULAR
  Filled 2019-07-02: qty 0.5
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
## 2019-07-07 ENCOUNTER — Ambulatory Visit: Payer: Self-pay

## 2019-07-08 ENCOUNTER — Telehealth: Payer: Self-pay | Admitting: *Deleted

## 2019-07-08 NOTE — Telephone Encounter (Signed)
-----   Message from Johaura Celedonio sent at 07/07/2019  3:46 PM EDT ----- Regarding: Referral Patient needs referral for hand - possible carpal tunnel

## 2019-07-14 ENCOUNTER — Ambulatory Visit: Payer: Self-pay

## 2019-07-19 ENCOUNTER — Other Ambulatory Visit: Payer: Self-pay

## 2019-08-12 ENCOUNTER — Telehealth: Payer: Self-pay

## 2019-08-12 NOTE — Telephone Encounter (Signed)
COVID-19 Pre-Screening Questions:08/12/19  Do you currently have a fever (>100 F), chills or unexplained body aches? NO  Are you currently experiencing new cough, shortness of breath, sore throat, runny nose? NO .  Have you recently travelled outside the state of [place] in the last 14 days? NO  .  Have you been in contact with someone that is currently pending confirmation of Covid19 testing or has been confirmed to have the Covid19 virus?  NO  **If the patient answers NO to ALL questions -  advise the patient to please call the clinic before coming to the office should any symptoms develop.     

## 2019-08-15 ENCOUNTER — Encounter: Payer: Self-pay | Admitting: Family

## 2019-08-18 ENCOUNTER — Ambulatory Visit: Payer: Self-pay | Admitting: Family

## 2019-08-25 ENCOUNTER — Telehealth: Payer: Self-pay

## 2019-08-25 NOTE — Telephone Encounter (Signed)
Received call from pharmacy notifying office that patient has not picked up medication for the last 3-4 months. Unsure if he is getting it elsewhere. Attempted to call patient to inquire about discrepancy but unable to leave voicemail.   Korvin Valentine Loyola Mast, RN

## 2019-09-01 ENCOUNTER — Ambulatory Visit: Payer: Self-pay

## 2019-09-13 ENCOUNTER — Other Ambulatory Visit: Payer: Self-pay

## 2019-09-13 ENCOUNTER — Ambulatory Visit: Payer: Self-pay

## 2019-09-27 ENCOUNTER — Other Ambulatory Visit: Payer: Self-pay

## 2019-09-30 ENCOUNTER — Other Ambulatory Visit: Payer: Self-pay

## 2019-10-08 ENCOUNTER — Encounter (HOSPITAL_COMMUNITY): Payer: Self-pay | Admitting: Emergency Medicine

## 2019-10-08 ENCOUNTER — Other Ambulatory Visit: Payer: Self-pay

## 2019-10-08 ENCOUNTER — Emergency Department (HOSPITAL_COMMUNITY)
Admission: EM | Admit: 2019-10-08 | Discharge: 2019-10-09 | Disposition: A | Discharge status: Home / Self Care | Payer: No Typology Code available for payment source | Attending: Emergency Medicine | Admitting: Emergency Medicine

## 2019-10-08 DIAGNOSIS — I10 Essential (primary) hypertension: Secondary | ICD-10-CM | POA: Diagnosis not present

## 2019-10-08 DIAGNOSIS — M542 Cervicalgia: Secondary | ICD-10-CM | POA: Diagnosis present

## 2019-10-08 DIAGNOSIS — Y998 Other external cause status: Secondary | ICD-10-CM | POA: Insufficient documentation

## 2019-10-08 DIAGNOSIS — Y9389 Activity, other specified: Secondary | ICD-10-CM | POA: Diagnosis not present

## 2019-10-08 DIAGNOSIS — M25511 Pain in right shoulder: Secondary | ICD-10-CM | POA: Insufficient documentation

## 2019-10-08 DIAGNOSIS — S161XXA Strain of muscle, fascia and tendon at neck level, initial encounter: Secondary | ICD-10-CM | POA: Insufficient documentation

## 2019-10-08 DIAGNOSIS — F17210 Nicotine dependence, cigarettes, uncomplicated: Secondary | ICD-10-CM | POA: Diagnosis not present

## 2019-10-08 DIAGNOSIS — Y9289 Other specified places as the place of occurrence of the external cause: Secondary | ICD-10-CM | POA: Diagnosis not present

## 2019-10-08 DIAGNOSIS — M25521 Pain in right elbow: Secondary | ICD-10-CM | POA: Insufficient documentation
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
## 2019-10-09 ENCOUNTER — Emergency Department (HOSPITAL_COMMUNITY): Payer: No Typology Code available for payment source

## 2019-10-09 MED ORDER — KETOROLAC TROMETHAMINE 60 MG/2ML IM SOLN
30.0000 mg | Freq: Once | INTRAMUSCULAR | Status: AC
  Administered 2019-10-09: 30 mg via INTRAMUSCULAR
  Filled 2019-10-09: qty 2

## 2019-10-09 NOTE — Discharge Instructions (Signed)
You may use over-the-counter Motrin (Ibuprofen), Acetaminophen (Tylenol), topical muscle creams such as SalonPas, Icy Hot, Bengay, etc. Please stretch, apply heat, and have massage therapy for additional assistance. ° °
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
## 2019-10-11 ENCOUNTER — Other Ambulatory Visit: Payer: Self-pay

## 2019-10-11 ENCOUNTER — Encounter: Payer: Self-pay | Admitting: Family

## 2019-10-11 ENCOUNTER — Encounter (INDEPENDENT_AMBULATORY_CARE_PROVIDER_SITE_OTHER): Payer: Self-pay | Admitting: Family

## 2019-10-11 ENCOUNTER — Ambulatory Visit: Payer: Self-pay

## 2019-10-11 DIAGNOSIS — B20 Human immunodeficiency virus [HIV] disease: Secondary | ICD-10-CM

## 2019-10-11 DIAGNOSIS — Z113 Encounter for screening for infections with a predominantly sexual mode of transmission: Secondary | ICD-10-CM

## 2019-10-12 LAB — T-HELPER CELL (CD4) - (RCID CLINIC ONLY)
CD4 % Helper T Cell: 29 % — ABNORMAL LOW (ref 33–65)
CD4 T Cell Abs: 641 /uL (ref 400–1790)

## 2019-10-12 NOTE — Progress Notes (Signed)
Not seen

## 2019-10-13 LAB — HIV-1 RNA QUANT-NO REFLEX-BLD
HIV 1 RNA Quant: <20 Copies/mL — ABNORMAL HIGH
HIV-1 RNA Quant, Log: <1.3 Log cps/mL — ABNORMAL HIGH

## 2019-10-13 LAB — RPR: RPR Ser Ql: NONREACTIVE

## 2019-10-18 ENCOUNTER — Ambulatory Visit: Payer: Self-pay | Admitting: Family

## 2019-10-18 ENCOUNTER — Ambulatory Visit: Payer: Self-pay

## 2019-11-03 ENCOUNTER — Encounter: Payer: Self-pay | Admitting: Family

## 2019-11-09 ENCOUNTER — Ambulatory Visit: Payer: Self-pay | Admitting: Family

## 2019-12-19 ENCOUNTER — Other Ambulatory Visit: Payer: Self-pay

## 2019-12-19 DIAGNOSIS — B20 Human immunodeficiency virus [HIV] disease: Secondary | ICD-10-CM

## 2019-12-19 DIAGNOSIS — Z113 Encounter for screening for infections with a predominantly sexual mode of transmission: Secondary | ICD-10-CM

## 2020-01-02 ENCOUNTER — Ambulatory Visit: Payer: Self-pay | Admitting: Family

## 2020-01-03 ENCOUNTER — Encounter: Payer: Self-pay | Admitting: Family

## 2020-01-03 ENCOUNTER — Ambulatory Visit (INDEPENDENT_AMBULATORY_CARE_PROVIDER_SITE_OTHER): Payer: Self-pay | Admitting: Family

## 2020-01-03 ENCOUNTER — Other Ambulatory Visit: Payer: Self-pay

## 2020-01-03 VITALS — BP 101/72 | HR 97 | Temp 98.1°F | Ht 63.0 in | Wt 125.0 lb

## 2020-01-03 DIAGNOSIS — Z0000 Encounter for general adult medical examination without abnormal findings: Secondary | ICD-10-CM

## 2020-01-03 DIAGNOSIS — Z79899 Other long term (current) drug therapy: Secondary | ICD-10-CM

## 2020-01-03 DIAGNOSIS — B20 Human immunodeficiency virus [HIV] disease: Secondary | ICD-10-CM

## 2020-01-03 DIAGNOSIS — Z113 Encounter for screening for infections with a predominantly sexual mode of transmission: Secondary | ICD-10-CM

## 2020-01-03 MED ORDER — BIKTARVY 50-200-25 MG PO TABS: 1.0000 | ORAL_TABLET | Freq: Every day | ORAL | 3 refills | Status: DC

## 2020-01-03 NOTE — Assessment & Plan Note (Signed)
Rodney Chase has less than optimal adherence over the past 2 months with his ART regimen of Biktarvy.  Fortunately he has no signs/symptoms of opportunistic infection or progressive HIV disease.  We discussed importance of taking medication to prevent future complications and progressing disease.  Check blood work today.  Restart taking Biktarvy.  He will set an alarm on his phone to help remind him.  Plan for follow-up in 1 month or sooner if needed.

## 2020-01-03 NOTE — Assessment & Plan Note (Signed)
·   Discussed importance of safe sexual practice to reduce risk of STI.  Condoms provided.  Due for routine dental care and will refer to [facility].

## 2020-01-03 NOTE — Progress Notes (Signed)
Subjective:    Patient ID: Rodney Chase, male    DOB: 1975/11/18, 44 y.o.   MRN: [id number]  Chief Complaint  Patient presents with  . Follow-up    given condoms      HPI:  Rodney Chase is a 44 y.o. male with HIV disease who was last seen in the office on 06/21/2019 with improved adherence to his ART regimen of Biktarvy.  Most recent blood work completed on 10/11/2019 with a viral load that was undetectable and CD4 count of 641.  RPR was nonreactive.  No recent blood work completed.  Here today for routine follow-up.  Rodney Chase has not been taking his medication for approximately 2 months now.  Has been taking medication consistently at best.  Overall feeling well today.  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 Denies fevers, chills, night sweats, headaches, changes in vision, neck pain/stiffness, nausea, diarrhea, vomiting, lesions or rashes.  Rodney Chase remains covered through [employer].  Denies feelings of being down, depressed, or hopeless recently.  Continues to smoke marijuana on a daily basis.  No current alcohol consumption and remains a every day tobacco smoker.  Condoms provided per request.  Due for routine dental care.   Allergies  Allergen Reactions  . Penicillins     Has patient had a PCN reaction causing immediate rash, facial/tongue/throat swelling, SOB or lightheadedness with hypotension: YES Has patient had a PCN reaction causing severe rash involving mucus membranes or skin necrosis: NO Has patient had a PCN reaction that required hospitalization: UNK Has patient had a PCN reaction occurring within the last 10 years: NO If all of the above answers are "NO", then may proceed with Cephalosporin use.      Outpatient Medications Prior to Visit  Medication Sig Dispense Refill  . bictegravir-emtricitabine-tenofovir AF (BIKTARVY) 50-200-25 MG TABS tablet Take 1 tablet by mouth daily. (Patient not taking: Reported on 01/03/2020) 30 tablet 3   No  facility-administered medications prior to visit.     Past Medical History:  Diagnosis Date  . HIV (human immunodeficiency virus infection) (HCC)   . Tobacco use      History reviewed. No pertinent surgical history.     Review of Systems  Constitutional: Negative for appetite change, chills, fatigue, fever and unexpected weight change.  Eyes: Negative for visual disturbance.  Respiratory: Negative for cough, chest tightness, shortness of breath and wheezing.   Cardiovascular: Negative for chest pain and leg swelling.  Gastrointestinal: Negative for abdominal pain, constipation, diarrhea, nausea and vomiting.  Genitourinary: Negative for dysuria, flank pain, frequency, genital sores, hematuria and urgency.  Skin: Negative for rash.  Allergic/Immunologic: Negative for immunocompromised state.  Neurological: Negative for dizziness and headaches.      Objective:    BP 101/72   Pulse 97   Temp 98.1 F (36.7 C) (Oral)   Ht 5\' 3"  (1.6 m)   Wt 125 lb (56.7 kg)   SpO2 100%   BMI 22.14 kg/m  Nursing note and vital signs reviewed.  Physical Exam Constitutional:      General: He is not in acute distress.    Appearance: He is well-developed.     Comments: Seated in the chair; odor of marijuana present  Eyes:     Conjunctiva/sclera: Conjunctivae normal.  Cardiovascular:     Rate and Rhythm: Normal rate and regular rhythm.     Heart sounds: Normal heart sounds. No murmur heard.  No friction rub. No gallop.   Pulmonary:  Effort: Pulmonary effort is normal. No respiratory distress.     Breath sounds: Normal breath sounds. No wheezing or rales.  Chest:     Chest wall: No tenderness.  Abdominal:     General: Bowel sounds are normal.     Palpations: Abdomen is soft.     Tenderness: There is no abdominal tenderness.  Musculoskeletal:     Cervical back: Neck supple.  Lymphadenopathy:     Cervical: No cervical adenopathy.  Skin:    General: Skin is warm and dry.      Findings: No rash.  Neurological:     Mental Status: He is alert and oriented to person, place, and time.  Psychiatric:        Behavior: Behavior normal.        Thought Content: Thought content normal.        Judgment: Judgment normal.      Depression screen [facility] 2/9 01/03/2020 07/19/2018 01/05/2018 07/08/2017 01/06/2017  Decreased Interest 3 0 0 3 0  Down, Depressed, Hopeless 1 0 0 3 1  PHQ - 2 Score 4 0 0 6 1  Altered sleeping 3 - - - -  Tired, decreased energy 0 - - - -  Change in appetite 1 - - - -  Feeling bad or failure about yourself  0 - - - -  Trouble concentrating 0 - - - -  Moving slowly or fidgety/restless 1 - - - -  Suicidal thoughts 0 - - - -  PHQ-9 Score 9 - - - -  Difficult doing work/chores Extremely dIfficult - - - -       Assessment & Plan:    Patient Active Problem List   Diagnosis Date Noted  . Healthcare maintenance 06/21/2019  . Elevated blood sugar level 03/05/2018  . Spasm of cervical paraspinous muscle 01/05/2018  . Essential hypertension 11/01/2014  . Hidradenitis suppurativa 11/01/2014  . Poor dentition 10/19/2013  . 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 Lymphadenopathy, inguinal 01/21/2013  . Onychomycosis due to dermatophyte 02/04/2011  . TOBACCO USER 04/01/2010  . 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 Anxiety state 03/02/2007  . EPIGASTRIC PAIN 11/10/2006  . Human immunodeficiency virus (HIV) disease (HCC) 03/27/2006  . HEADACHE 03/27/2006     Problem List Items Addressed This Visit      Other   Human immunodeficiency virus (HIV) disease (HCC) - Primary    Rodney Chase has less than optimal adherence over the past 2 months with his ART regimen of Biktarvy.  Fortunately he has no signs/symptoms of opportunistic infection or progressive HIV disease.  We discussed importance of taking medication to prevent future complications and progressing disease.  Check blood work today.  Restart taking Biktarvy.  He will set an alarm on his phone to help remind him.  Plan  for follow-up in 1 month or sooner if needed.      Relevant Medications   bictegravir-emtricitabine-tenofovir AF (BIKTARVY) 50-200-25 MG TABS tablet   Other Relevant Orders   COMPLETE METABOLIC PANEL WITH GFR   T-helper cell (CD4)- ([facility] clinic only)   HIV RNA, RTPCR W/R GT (RTI, PI,INT)   Healthcare maintenance     Discussed importance of safe sexual practice to reduce risk of STI.  Condoms provided.  Due for routine dental care and will refer to [facility].        Other Visit Diagnoses    Pharmacologic therapy       Relevant Orders   Lipid panel   Screening for STDs (  sexually transmitted diseases)       Relevant Orders   RPR       I am having Rodney Chase maintain his [employer].   Meds ordered this encounter  Medications  . bictegravir-emtricitabine-tenofovir AF (BIKTARVY) 50-200-25 MG TABS tablet    Sig: Take 1 tablet by mouth daily.    Dispense:  30 tablet    Refill:  3    Order Specific Question:   Supervising Provider    Answer:   Judyann Munson [[id number]]     Follow-up: Return in about 1 month (around 02/02/2020), or if symptoms worsen or fail to improve.   Marcos Eke, MSN, FNP-C Nurse Practitioner [facility] Main number: [phone]

## 2020-01-03 NOTE — Patient Instructions (Addendum)
Nice to see you.  We will check your lab work today.  Restart taking Biktarvy daily.  Refills have been sent to the pharmacy.  Plan for follow up in 1 month or sooner if needed.

## 2020-01-04 LAB — T-HELPER CELL (CD4) - (RCID CLINIC ONLY)
CD4 % Helper T Cell: 27 % — ABNORMAL LOW (ref 33–65)
CD4 T Cell Abs: 478 /uL (ref 400–1790)

## 2020-01-10 ENCOUNTER — Ambulatory Visit: Payer: Self-pay

## 2020-01-10 ENCOUNTER — Other Ambulatory Visit: Payer: Self-pay

## 2020-01-15 LAB — HIV-1 INTEGRASE GENOTYPE

## 2020-01-15 LAB — COMPLETE METABOLIC PANEL WITH GFR
AG Ratio: 1.3 (calc) (ref 1.0–2.5)
ALT: 11 U/L (ref 9–46)
AST: 20 U/L (ref 10–40)
Albumin: 3.9 g/dL (ref 3.6–5.1)
Alkaline phosphatase (APISO): 44 U/L (ref 36–130)
BUN: 11 mg/dL (ref 7–25)
CO2: 27 mmol/L (ref 20–32)
Calcium: 9.3 mg/dL (ref 8.6–10.3)
Chloride: 104 mmol/L (ref 98–110)
Creat: 0.93 mg/dL (ref 0.60–1.35)
GFR, Est African American: 116 mL/min/{1.73_m2} (ref >=60)
GFR, Est Non African American: 100 mL/min/{1.73_m2} (ref >=60)
Globulin: 2.9 g/dL (calc) (ref 1.9–3.7)
Glucose, Bld: 93 mg/dL (ref 65–99)
Potassium: 4.5 mmol/L (ref 3.5–5.3)
Sodium: 138 mmol/L (ref 135–146)
Total Bilirubin: 0.4 mg/dL (ref 0.2–1.2)
Total Protein: 6.8 g/dL (ref 6.1–8.1)

## 2020-01-15 LAB — HIV RNA, RTPCR W/R GT (RTI, PI,INT)
HIV 1 RNA Quant: 66800 copies/mL — ABNORMAL HIGH
HIV-1 RNA Quant, Log: 4.82 Log copies/mL — ABNORMAL HIGH

## 2020-01-15 LAB — RPR: RPR Ser Ql: NONREACTIVE

## 2020-01-15 LAB — LIPID PANEL
Cholesterol: 132 mg/dL (ref <200)
HDL: 41 mg/dL (ref >=40)
LDL Cholesterol (Calc): 78 mg/dL (calc)
Non-HDL Cholesterol (Calc): 91 mg/dL (calc) (ref <130)
Total CHOL/HDL Ratio: 3.2 (calc) (ref <5.0)
Triglycerides: 53 mg/dL (ref <150)

## 2020-01-15 LAB — HIV-1 GENOTYPE: HIV-1 Genotype: DETECTED — AB

## 2020-01-20 ENCOUNTER — Telehealth: Payer: Self-pay

## 2020-01-20 NOTE — Telephone Encounter (Signed)
Patient called requesting pain medications and an appointment  due to neck and back pain. Patient reported he did not have a pcp. Patient advised there were not any doctors in this afternoon and he could go to the ER or UC. Patient verbalized understanding and was ok with going to UC Rodney Chase

## 2020-01-25 ENCOUNTER — Telehealth: Payer: Self-pay

## 2020-01-25 NOTE — Telephone Encounter (Signed)
Patient requesting Ensure. Patient states he was taking Ensure in the past and feels like he would benefit from it again.  Weight at last visit 125lb BMI 22.14  Routing to provider for advise. Rodney Chase

## 2020-01-28 IMAGING — DX DG CHEST 2V
2 series · 2 of 2 positions shown · non-contrast
Comparison: Radiographs June 28, 2015.

CLINICAL DATA: Shortness of breath.

EXAM:
CHEST - 2 VIEW

[chest pa]
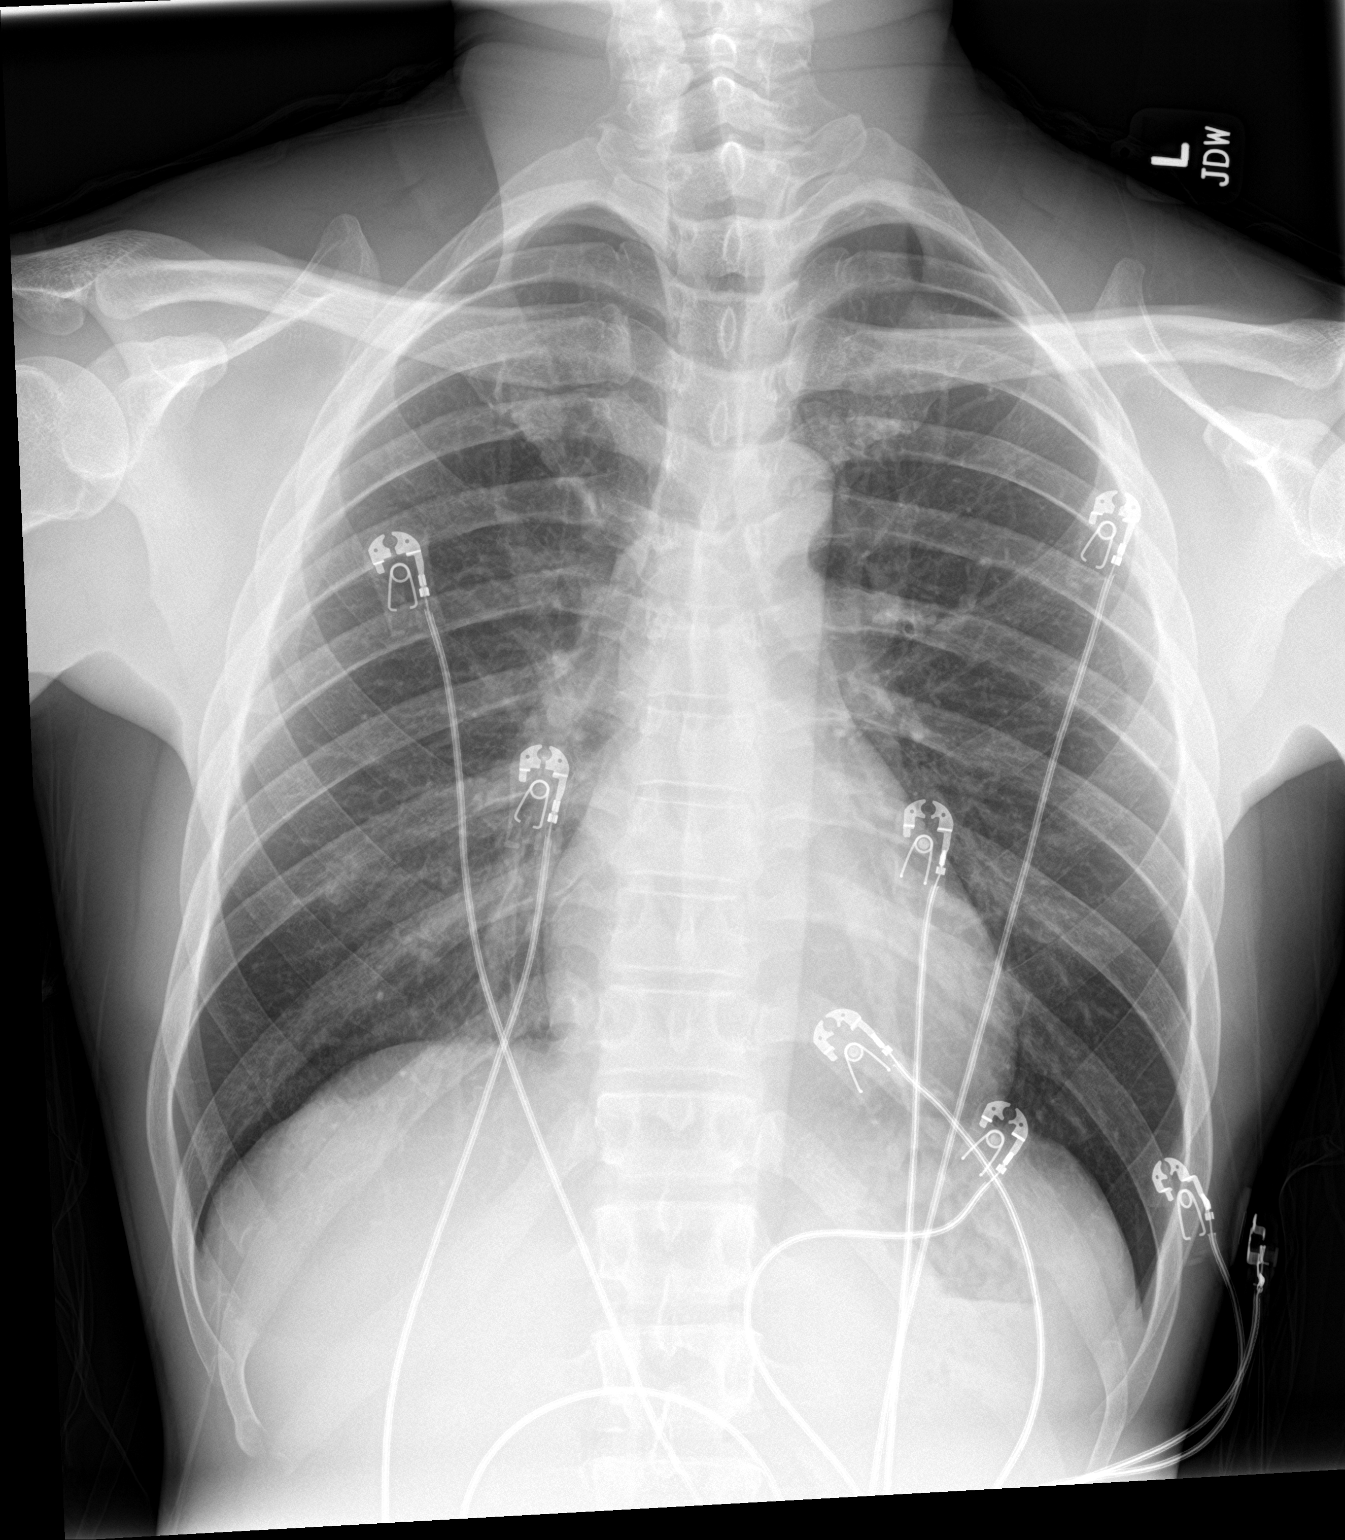

[chest lat]
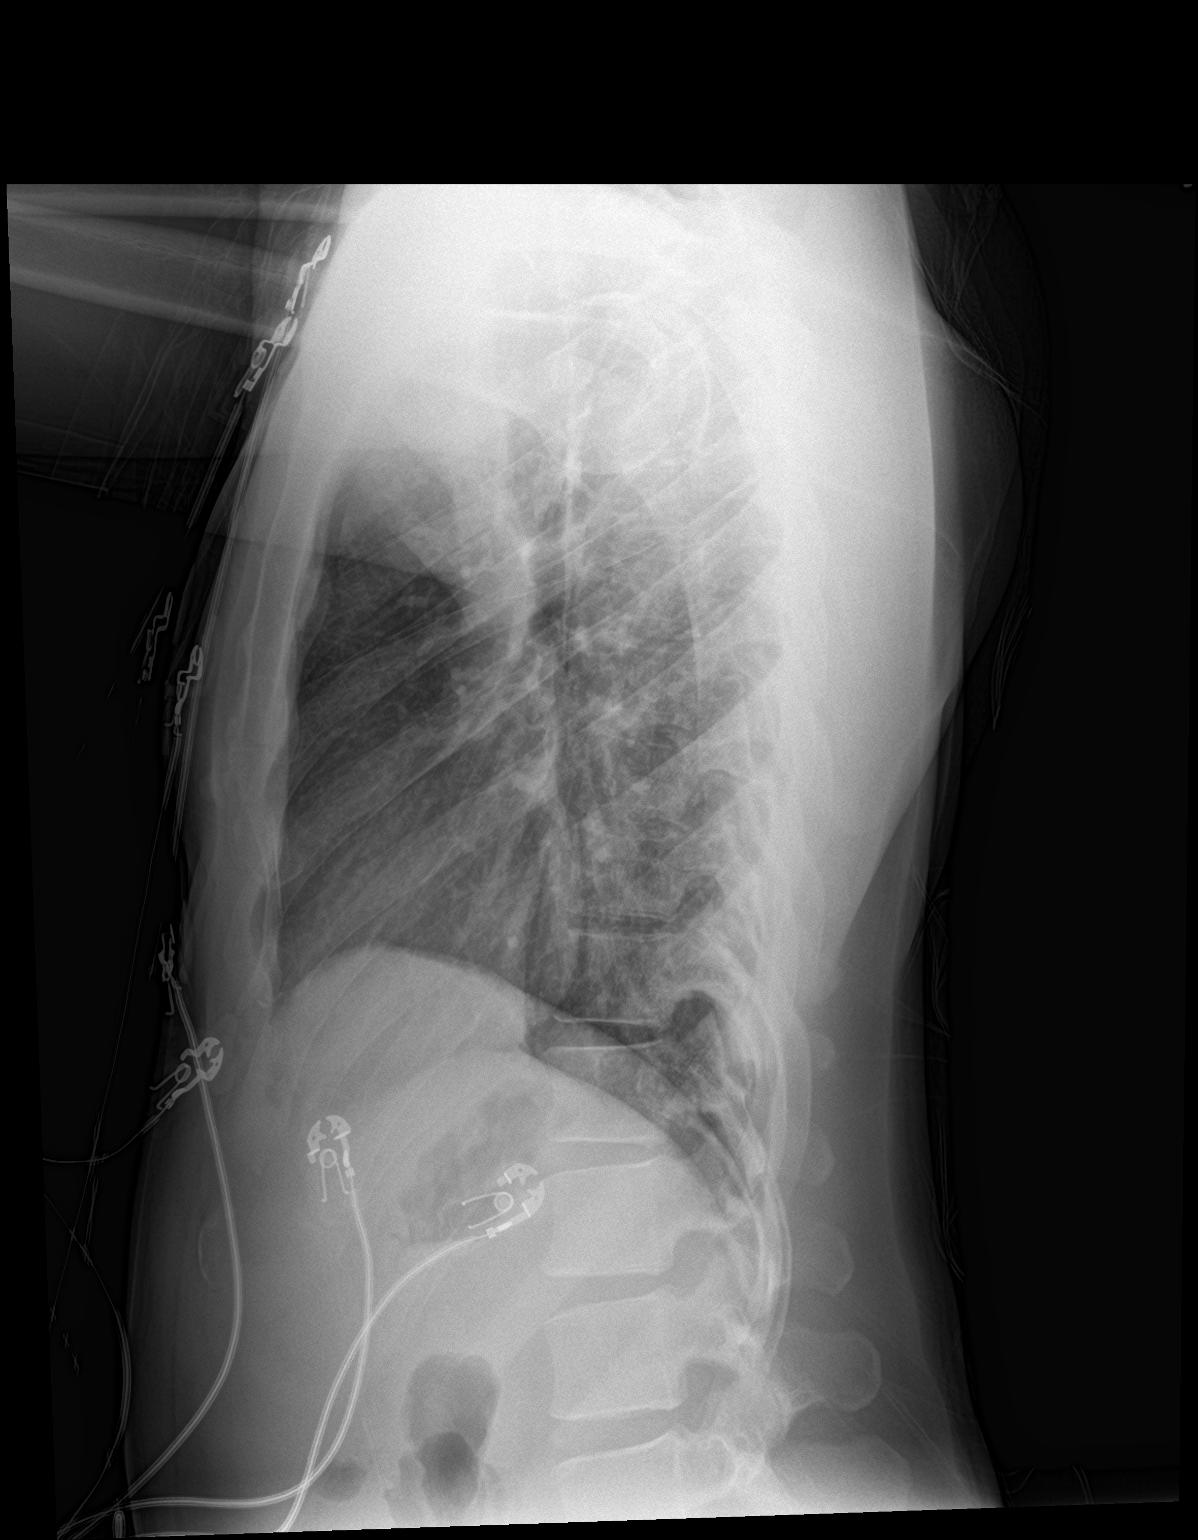

[2 of 2 positions shown; findings below may reference images not displayed]

FINDINGS: The heart size and mediastinal contours are within normal limits.
Both lungs are clear. No pneumothorax or pleural effusion is noted.
The visualized skeletal structures are unremarkable.
IMPRESSION: No active cardiopulmonary disease.

## 2020-01-31 MED ORDER — ENSURE PO LIQD: 1.0000 | Freq: Every day | ORAL | 5 refills | Status: DC

## 2020-01-31 NOTE — Telephone Encounter (Signed)
Patient has not been seen by [facility] in some time. Called patient to advise him that he will need to reinitiate care with them in order to get his Ensure at no cost. Gave patient [facility]'s phone number, he states he will call them to reconnect.   Sandie Ano, RN

## 2020-01-31 NOTE — Telephone Encounter (Signed)
Prescription printed for faxing to [facility]

## 2020-01-31 NOTE — Telephone Encounter (Signed)
Called patient to let him know Rodney Eke, NP wrote his prescription for Ensure. Gave prescription to Brianna ([facility]). Patient aware.   Sandie Ano, RN

## 2020-01-31 NOTE — Telephone Encounter (Signed)
Patient called requesting update on status of Ensure prescription.   Sandie Ano, RN

## 2020-01-31 NOTE — Addendum Note (Signed)
Addended by: Jeanine Luz D on: 01/31/2020 10:26 AM   Modules accepted: Orders

## 2020-02-02 ENCOUNTER — Ambulatory Visit: Payer: Self-pay | Admitting: Family

## 2020-02-07 ENCOUNTER — Ambulatory Visit: Payer: Self-pay | Admitting: Family

## 2020-02-28 ENCOUNTER — Other Ambulatory Visit: Payer: Self-pay

## 2020-02-28 ENCOUNTER — Encounter: Payer: Self-pay | Admitting: Family

## 2020-02-28 ENCOUNTER — Telehealth (INDEPENDENT_AMBULATORY_CARE_PROVIDER_SITE_OTHER): Payer: Self-pay | Admitting: Family

## 2020-02-28 DIAGNOSIS — Z0000 Encounter for general adult medical examination without abnormal findings: Secondary | ICD-10-CM

## 2020-02-28 DIAGNOSIS — B20 Human immunodeficiency virus [HIV] disease: Secondary | ICD-10-CM

## 2020-02-28 MED ORDER — ENSURE PO LIQD: 1.0000 | Freq: Every day | ORAL | 5 refills | Status: DC

## 2020-02-28 NOTE — Assessment & Plan Note (Signed)
·   Discussed importance of safe sexual practice to reduce risk of STI.  Unable to provide condoms secondary to telehealth visit.  Recommended COVID and influenza vaccinations.  Routine dental care up to date.
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
## 2020-02-28 NOTE — Patient Instructions (Signed)
Nice to speak with you.  Continue to take your [place] daily as prescribed.  Refills with the pharmacy.  Please schedule time for lab follow-up to recheck your viral load and CD4 count.  Plan for follow-up in 1 month or sooner if needed with lab work on the same day.  Have a great day and stay safe!  Happy new year!

## 2020-02-28 NOTE — Assessment & Plan Note (Signed)
Mr. Kossman appears to have improved adherence to his ART regimen of Biktarvy.  No signs/symptoms of opportunistic infection or progressive HIV disease.  Reiterated the importance of taking medication daily as prescribed.  Place orders for lab work to be completed to recheck that he is undetectable and continue to take medications.  Continue current dose of Biktarvy.  He has reengaging with [facility].  Plan for follow-up in 1 month or sooner if needed.

## 2020-02-28 NOTE — Addendum Note (Signed)
Addended by: Jeanine Luz D on: 02/28/2020 01:10 PM   Modules accepted: Orders

## 2020-03-01 ENCOUNTER — Other Ambulatory Visit: Payer: Self-pay

## 2020-03-01 ENCOUNTER — Ambulatory Visit: Payer: Self-pay

## 2020-03-01 ENCOUNTER — Telehealth: Payer: Self-pay | Admitting: Physician Assistant

## 2020-03-01 DIAGNOSIS — U071 COVID-19: Secondary | ICD-10-CM

## 2020-03-01 DIAGNOSIS — D849 Immunodeficiency, unspecified: Secondary | ICD-10-CM

## 2020-03-01 LAB — POC COVID19 BINAXNOW: SARS Coronavirus 2 Ag: POSITIVE — AB

## 2020-03-01 MED ORDER — GUAIFENESIN ER 600 MG PO TB12: 600.0000 mg | ORAL_TABLET | Freq: Two times a day (BID) | ORAL | 0 refills | Status: AC

## 2020-03-01 NOTE — Progress Notes (Signed)
New Patient Office Visit  Subjective:  Patient ID: Rodney Chase, male    DOB: 03-Mar-1976  Age: 44 y.o. MRN: [id number]  CC:  Chief Complaint  Patient presents with  . URI   Virtual Visit via Telephone Note  I connected with [name] on 03/01/20 at 10:40 AM EST by telephone and verified that I am speaking with the correct person using two identifiers.  Location: Patient: car Provider: [facility]    I discussed the limitations, risks, security and privacy concerns of performing an evaluation and management service by telephone and the availability of in person appointments. I also discussed with the patient that there may be a patient responsible charge related to this service. The patient expressed understanding and agreed to proceed.   History of Present Illness: Rodney Chase reports he has been having a productive cough with a tannish-brown-colored sputum, shortness of breath and overall body pain which he states started last night. Reports that he has been having chills, has not taken his temperature at home.  Reports taking Tylenol without much relief. Does endorse significant history of HIV infection, is managed by infectious disease. Denies previous Covid vaccines. No recent flu shot.  Denies sick contacts. Eating and drinking okay.   Observations/Objective:  Medical history and current medications reviewed, no physical exam completed   Past Medical History:  Diagnosis Date  . HIV (human immunodeficiency virus infection) (HCC)   . Tobacco use     History reviewed. No pertinent surgical history.  Family History  Problem Relation Age of Onset  . Diabetes Daughter   . Breast cancer Mother        breast  . Cancer Maternal Grandmother   . Diabetes Paternal Grandmother   . Lung cancer Maternal Aunt     Social History   Socioeconomic History  . Marital status: Single    Spouse name: Not on file  . Number of children: Not on file  .  Years of education: Not on file  . Highest education level: Not on file  Occupational History  . Not on file  Tobacco Use  . Smoking status: Current Every Day Smoker    Packs/day: 0.20    Types: Cigarettes  . Smokeless tobacco: Never Used  Vaping Use  . Vaping Use: Never used  Substance and Sexual Activity  . Alcohol use: No    Alcohol/week: 0.0 standard drinks  . Drug use: Yes    Types: Marijuana    Comment: daily   . Sexual activity: Not on file  Other Topics Concern  . Not on file  Social History Narrative   Lives with roommate   He was working at the water plant treatment for [employer] level of education: 12th   Social Determinants of Health   Financial Resource Strain: Not on file  Food Insecurity: Not on file  Transportation Needs: Not on file  Physical Activity: Not on file  Stress: Not on file  Social Connections: Not on file  Intimate Partner Violence: Not on file    ROS Review of Systems  Constitutional: Positive for chills. Negative for fever.  HENT: Positive for congestion. Negative for sinus pressure and trouble swallowing.   Eyes: Negative.   Respiratory: Positive for cough.   Cardiovascular: Negative for chest pain and palpitations.  Gastrointestinal: Negative for abdominal pain, nausea and vomiting.  Endocrine: Negative.   Genitourinary: Negative.   Musculoskeletal: Positive for myalgias.  Skin: Negative.  Allergic/Immunologic: Positive for immunocompromised state.  Neurological: Negative for headaches.  Hematological: Negative.   Psychiatric/Behavioral: Negative.     Objective:   Today's Vitals: There were no vitals taken for this visit.    Assessment & Plan:   Problem List Items Addressed This Visit      Other   COVID-19 in immunocompromised patient (HCC) - Primary   Relevant Medications   guaiFENesin (MUCINEX) 600 MG 12 hr tablet   Other Relevant Orders   POC COVID-19 (Completed)      Outpatient Encounter  Medications as of 03/01/2020  Medication Sig  . bictegravir-emtricitabine-tenofovir AF (BIKTARVY) 50-200-25 MG TABS tablet Take 1 tablet by mouth daily.  . Ensure (ENSURE) Take 1 Can by mouth daily.  Marland Kitchen guaiFENesin (MUCINEX) 600 MG 12 hr tablet Take 1 tablet (600 mg total) by mouth 2 (two) times daily.   No facility-administered encounter medications on file as of 03/01/2020.   Assessment and Plan: 1. COVID-19 in immunocompromised patient [facility]) Patient evaluated, tested and sent home with instructions for home care and Quarantine. Instructed to seek further care if symptoms worsen.  Is set up with follow up for a virtual visit with mobile medicine unit on March 06, 2020.  Patient will be referred to monoclonal antibody infusion treatment clinic  - guaiFENesin (MUCINEX) 600 MG 12 hr tablet; Take 1 tablet (600 mg total) by mouth 2 (two) times daily.  Dispense: 60 tablet; Refill: 0 - POC COVID-19   Follow Up Instructions:    I discussed the assessment and treatment plan with the patient. The patient was provided an opportunity to ask questions and all were answered. The patient agreed with the plan and demonstrated an understanding of the instructions.   The patient was advised to call back or seek an in-person evaluation if the symptoms worsen or if the condition fails to improve as anticipated.  I provided 30 minutes of non-face-to-face time during this encounter.      Follow-up: Return in about 5 days (around 03/06/2020).   Kasandra Knudsen Mayers, PA-C

## 2020-03-01 NOTE — Progress Notes (Signed)
Patient shares he began symptoms over 10 days ago. Patient complains of SOB since last night. Patient complains of body pain from coughing. Patient complains of congestion with productive cough with tanish-brown color. Patient denies exposure. Patient does have taste and smell. Shares smoking taste "nasty". Patient complains of chills. Patient complains of arm pain and wants to be checked for lung cancer.

## 2020-03-01 NOTE — Patient Instructions (Addendum)
Your Covid test was Positive.  I sent a cough medication to your pharmacy to help you with your cough and congestion.  I encourage you to stay well-hydrated, continue using Tylenol or ibuprofen to help you with your body pain.  You do need to quarantine for 10 days, I am sending your information to the monoclonal antibody infusion treatment center, they will be contacting you in the next 24 to 48 hours.  Please expect a call from me for a virtual visit on Tuesday, January 4 at 10:30 AM.  I hope that you feel better soon  Roney Jaffe, PA-C Physician Assistant [facility] [email or web address]   10 Things You Can Do to Manage Your COVID-19 Symptoms at Home If you have possible or confirmed COVID-19: 1. Stay home from work and school. And stay away from other public places. If you must go out, avoid using any kind of public transportation, ridesharing, or taxis. 2. Monitor your symptoms carefully. If your symptoms get worse, call your healthcare provider immediately. 3. Get rest and stay hydrated. 4. If you have a medical appointment, call the healthcare provider ahead of time and tell them that you have or may have COVID-19. 5. For medical emergencies, call 911 and notify the dispatch personnel that you have or may have COVID-19. 6. Cover your cough and sneezes with a tissue or use the inside of your elbow. 7. Wash your hands often with soap and water for at least 20 seconds or clean your hands with an alcohol-based hand sanitizer that contains at least 60% alcohol. 8. As much as possible, stay in a specific room and away from other people in your home. Also, you should use a separate bathroom, if available. If you need to be around other people in or outside of the home, wear a mask. 9. Avoid sharing personal items with other people in your household, like dishes, towels, and bedding. 10. Clean all surfaces that are touched often, like  counters, tabletops, and doorknobs. Use household cleaning sprays or wipes according to the label instructions. [email or web address] 09/01/2018 This information is not intended to replace advice given to you by your health care provider. Make sure you discuss any questions you have with your health care provider. Document Revised: 02/03/2019 Document Reviewed: 02/03/2019 Elsevier Patient Education  2020 Elsevier Inc.     Person Under Monitoring Name: Rodney Chase  Location: [address] [place] [address]   Infection Prevention Recommendations for Individuals Confirmed to have, or Being Evaluated for, 2019 Novel Coronavirus (COVID-19) Infection Who Receive Care at Home  Individuals who are confirmed to have, or are being evaluated for, COVID-19 should follow the prevention steps below until a healthcare provider or local or state health department says they can return to normal activities.  Stay home except to get medical care You should restrict activities outside your home, except for getting medical care. Do not go to work, school, or public areas, and do not use public transportation or taxis.  Call ahead before visiting your doctor Before your medical appointment, call the healthcare provider and tell them that you have, or are being evaluated for, COVID-19 infection. This will help the healthcare provider's office take steps to keep other people from getting infected. Ask your healthcare provider to call the local or state health department.  Monitor your symptoms Seek prompt medical attention if your illness is worsening (e.g., difficulty breathing). Before going to your medical appointment, call the healthcare provider and tell them that  you have, or are being evaluated for, COVID-19 infection. Ask your healthcare provider to call the local or state health department.  Wear a facemask You should wear a facemask that covers your nose and mouth when you are in the  same room with other people and when you visit a healthcare provider. People who live with or visit you should also wear a facemask while they are in the same room with you.  Separate yourself from other people in your home As much as possible, you should stay in a different room from other people in your home. Also, you should use a separate bathroom, if available.  Avoid sharing household items You should not share dishes, drinking glasses, cups, eating utensils, towels, bedding, or other items with other people in your home. After using these items, you should wash them thoroughly with soap and water.  Cover your coughs and sneezes Cover your mouth and nose with a tissue when you cough or sneeze, or you can cough or sneeze into your sleeve. Throw used tissues in a lined trash can, and immediately wash your hands with soap and water for at least 20 seconds or use an alcohol-based hand rub.  Wash your Union Pacific Corporationhands Wash your hands often and thoroughly with soap and water for at least 20 seconds. You can use an alcohol-based hand sanitizer if soap and water are not available and if your hands are not visibly dirty. Avoid touching your eyes, nose, and mouth with unwashed hands.   Prevention Steps for Caregivers and Household Members of Individuals Confirmed to have, or Being Evaluated for, COVID-19 Infection Being Cared for in the Home  If you live with, or provide care at home for, a person confirmed to have, or being evaluated for, COVID-19 infection please follow these guidelines to prevent infection:  Follow healthcare provider's instructions Make sure that you understand and can help the patient follow any healthcare provider instructions for all care.  Provide for the patient's basic needs You should help the patient with basic needs in the home and provide support for getting groceries, prescriptions, and other personal needs.  Monitor the patient's symptoms If they are getting  sicker, call his or her medical provider and tell them that the patient has, or is being evaluated for, COVID-19 infection. This will help the healthcare provider's office take steps to keep other people from getting infected. Ask the healthcare provider to call the local or state health department.  Limit the number of people who have contact with the patient  If possible, have only one caregiver for the patient.  Other household members should stay in another home or place of residence. If this is not possible, they should stay  in another room, or be separated from the patient as much as possible. Use a separate bathroom, if available.  Restrict visitors who do not have an essential need to be in the home.  Keep older adults, very young children, and other sick people away from the patient Keep older adults, very young children, and those who have compromised immune systems or chronic health conditions away from the patient. This includes people with chronic heart, lung, or kidney conditions, diabetes, and cancer.  Ensure good ventilation Make sure that shared spaces in the home have good air flow, such as from an air conditioner or an opened window, weather permitting.  Wash your hands often  Wash your hands often and thoroughly with soap and water for at least 20 seconds. You can  use an alcohol based hand sanitizer if soap and water are not available and if your hands are not visibly dirty.  Avoid touching your eyes, nose, and mouth with unwashed hands.  Use disposable paper towels to dry your hands. If not available, use dedicated cloth towels and replace them when they become wet.  Wear a facemask and gloves  Wear a disposable facemask at all times in the room and gloves when you touch or have contact with the patient's blood, body fluids, and/or secretions or excretions, such as sweat, saliva, sputum, nasal mucus, vomit, urine, or feces.  Ensure the mask fits over your nose and  mouth tightly, and do not touch it during use.  Throw out disposable facemasks and gloves after using them. Do not reuse.  Wash your hands immediately after removing your facemask and gloves.  If your personal clothing becomes contaminated, carefully remove clothing and launder. Wash your hands after handling contaminated clothing.  Place all used disposable facemasks, gloves, and other waste in a lined container before disposing them with other household waste.  Remove gloves and wash your hands immediately after handling these items.  Do not share dishes, glasses, or other household items with the patient  Avoid sharing household items. You should not share dishes, drinking glasses, cups, eating utensils, towels, bedding, or other items with a patient who is confirmed to have, or being evaluated for, COVID-19 infection.  After the person uses these items, you should wash them thoroughly with soap and water.  Wash laundry thoroughly  Immediately remove and wash clothes or bedding that have blood, body fluids, and/or secretions or excretions, such as sweat, saliva, sputum, nasal mucus, vomit, urine, or feces, on them.  Wear gloves when handling laundry from the patient.  Read and follow directions on labels of laundry or clothing items and detergent. In general, wash and dry with the warmest temperatures recommended on the label.  Clean all areas the individual has used often  Clean all touchable surfaces, such as counters, tabletops, doorknobs, bathroom fixtures, toilets, phones, keyboards, tablets, and bedside tables, every day. Also, clean any surfaces that may have blood, body fluids, and/or secretions or excretions on them.  Wear gloves when cleaning surfaces the patient has come in contact with.  Use a diluted bleach solution (e.g., dilute bleach with 1 part bleach and 10 parts water) or a household disinfectant with a label that says EPA-registered for coronaviruses. To make a  bleach solution at home, add 1 tablespoon of bleach to 1 quart (4 cups) of water. For a larger supply, add  cup of bleach to 1 gallon (16 cups) of water.  Read labels of cleaning products and follow recommendations provided on product labels. Labels contain instructions for safe and effective use of the cleaning product including precautions you should take when applying the product, such as wearing gloves or eye protection and making sure you have good ventilation during use of the product.  Remove gloves and wash hands immediately after cleaning.  Monitor yourself for signs and symptoms of illness Caregivers and household members are considered close contacts, should monitor their health, and will be asked to limit movement outside of the home to the extent possible. Follow the monitoring steps for close contacts listed on the symptom monitoring form.   ? If you have additional questions, contact your local health department or call the epidemiologist on call at [phone] (available 24/7). ? This guidance is subject to change. For the most up-to-date guidance from  CDC, please refer to their website: TripMetro.hu

## 2020-03-05 ENCOUNTER — Telehealth: Payer: Self-pay | Admitting: *Deleted

## 2020-03-05 NOTE — Telephone Encounter (Signed)
Patient left message in Triage, asking for refills, stating he has only 2 pills left. Per chart, 4 month supply of Biktarvy was sent to [facility] in November 2021.  RN returned the patient's call, asked him to contact [facility] for the prescription already there. Andree Coss, RN

## 2020-03-06 ENCOUNTER — Telehealth: Payer: Self-pay | Admitting: Physician Assistant

## 2020-03-06 ENCOUNTER — Telehealth: Payer: Self-pay

## 2020-03-06 DIAGNOSIS — R5383 Other fatigue: Secondary | ICD-10-CM

## 2020-03-06 DIAGNOSIS — D849 Immunodeficiency, unspecified: Secondary | ICD-10-CM

## 2020-03-06 DIAGNOSIS — U071 COVID-19: Secondary | ICD-10-CM

## 2020-03-06 NOTE — Patient Instructions (Signed)
Your last day of quarantine would be Friday, March 09, 2020.  I encourage you to monitor your temperature.  I encourage you to continue using over-the-counter symptom relief medications as needed, continue to stay well-hydrated and get plenty of rest.  Please let us know if there is anything else we can do for you  Kennieth Rad, PA-C Physician Assistant [place] [email or web address]    Infection Prevention in the Home If you have an infection, may have been exposed to an infection, or are taking care of someone who has an infection, it is important to know how to keep the infection from spreading. Follow your health care provider's instructions and use these guidelines to help stop the spread of infection. How infections are spread In order for an infection to spread, the following must be present:  A germ. This may be a virus, bacteria, fungus, or parasite.  A place for the germ to live. This may be: ? On or in a person, animal, plant, or food. ? In soil or water. ? On surfaces, such as a door handle.  A person or animal who can develop a disease if the germ enters the body (host). The host does not have resistance to the germ.  A way for the germ to enter the host. This may occur by: ? Direct contact with an infected person or animal. This can happen through shaking hands or hugging. Some germs can also travel through the air and spread to others. This can happen when an infected person coughs or sneezes on or near other people. ? Indirect contact. This occurs when the germ enters the host through contact with an infected object. Examples include:  Eating or drinking food or water that has the germ (is contaminated).  Touching a contaminated surface with your hands, and then touching your face, eyes, nose, or mouth. Supplies needed:  Soap.  Alcohol-based hand sanitizer.  Standard cleaning products.  Disinfectants,  such as bleach.  Reusable cleaning cloths, sponges, or paper towels.  Disposable or reusable utility gloves. How to prevent infection from spreading There are several things that you can do to help prevent infection from spreading. Take these general actions Everyone should take the following actions to prevent the spread of infection:  Wash your hands often with soap and water for at least 20 seconds. If soap and water are not available, use alcohol-based hand sanitizer.  Avoid touching your face, mouth, nose, or eyes.  Cough or sneeze into a tissue, sleeve, or elbow instead of into your hand or into the air. ? If you cough or sneeze into a tissue, throw it away immediately and wash your hands.  Keep your bathroom clean  Provide soap.  Change towels and washcloths frequently.  Change toothbrushes often and store them separately in a clean, dry place.  Clean and disinfect all surfaces, including the toilet, floor, tub, shower, and sink.  Do not share personal items, such as razors, toothbrushes, deodorant, combs, brushes, towels, and washcloths. Maintain hygiene in the [facility] your hands before and after preparing food and before you eat.  Clean the inside of your refrigerator each week.  Keep your refrigerator set at 25F (4C) or less, and set your freezer at 50F (-18C) or less.  Keep work surfaces clean. Disinfect them regularly.  Wash your dishes in hot, soapy water. Air-dry your dishes or use a dishwasher.  Do not share dishes or eating utensils. Handle food safely  Store  food carefully.  Refrigerate leftovers promptly in covered containers.  Throw out stale or spoiled food.  Thaw foods in the refrigerator or microwave, not at room temperature.  Serve foods at the proper temperature. Do not eat raw meat. Make sure it is cooked to the appropriate temperature. Cook eggs until they are firm.  Wash fruits and vegetables under running water.  Use  separate cutting boards, plates, and utensils for raw foods and cooked foods.  Use a clean spoon each time you sample food while cooking. Do laundry the right way  Wear gloves if laundry is visibly soiled.  Do not shake soiled laundry. Doing that may send germs into the air.  Wash laundry in hot water.  If you cannot wash the laundry right away, place it in a plastic bag and wash it as soon as possible. Be careful around animals and pets  Wash your hands before and after touching animals.  If you have a pet, ensure that your pet stays clean. Do not let people with weak immune systems touch bird droppings, fish tank water, or a litter box. ? If you have a pet cage or litter box, be sure to clean it every day.  If you are sick, stay away from animals and have someone else care for them if possible. How to clean and disinfect objects and surfaces Precautions  Some disinfectants work for certain germs and not others. Read the manufacturer's instructions or read online resources to determine if the product you are using will work for the germ you are trying to remove.  If you choose to use bleach, use it safely. Never mix it with other cleaning products, especially those that contain ammonia. This mixture can create a dangerous gas that may be deadly.  Keep proper movement of fresh air in your home (ventilation).  Pour used mop water down the utility sink or toilet. Do not pour this water down the kitchen sink. Objects and surfaces   If surfaces are visibly soiled, clean them first with soap and water before disinfecting.  Disinfect surfaces that are frequently touched every day. This may include: ? Counters. ? Tables. ? Doorknobs. ? Sinks and faucets. ? Electronics, such as:  Engineer, technical sales.  Remote controls.  Keyboards.  Computers and tablets. Cleaning supplies Some cleaning supplies can breed germs. Take good care of them to prevent germs from spreading. To do this:  Soak  toilet brushes, mops, and sponges in bleach and water for 5 minutes after each use, or according to manufacturer's instructions.  Wash reusable cleaning cloths and sanitize sponges after each use.  Throw away disposable gloves after one use.  Replace reusable utility gloves if they are cracked or torn or if they start to peel. Additional actions if you are sick If you live with other people:   Avoid close contact with those around you. Stay at least 3 ft (1 m) away from others, if possible.  Use a separate bathroom, if possible.  If possible, sleep in a separate bedroom or in a separate bed to prevent infecting other household members. ? Change bedroom linens each week or whenever they are soiled.  Have everyone in the household wash hands often with soap and water. If soap and water are not available, use alcohol-based hand sanitizer. In general:  Stay home except to get medical care. Call ahead before visiting your health care provider.  Ask others to get groceries and household supplies and to refill prescriptions for you.  Avoid public  areas. Try not to take public transportation.  If you can, wear a mask if you need to go out of the house, or if you are in close contact with someone who is not sick.  Avoid visitors until you have completely recovered, or until you have no signs and symptoms of infection.  Avoid preparing food or providing care for others. If you must prepare food or provide care for others, wear a mask and wash your hands before and after doing these things. Where to find more information  Centers for Disease Control and Prevention: WorkDashboard.es  World Health Organization [facility]): BeverageBargains.co.za  Association for Professionals in Infection Control and Epidemiology: professionals.site.DropOption.si Summary  It is important to know how to keep the  infection from spreading.  Make sure everyone in your household washes their hands often with soap and water.  Disinfect surfaces that are frequently touched every day.  If you are sick, stay home except to get medical care. This information is not intended to replace advice given to you by your health care provider. Make sure you discuss any questions you have with your health care provider. Document Released: 11/27/2007 Document Revised: 06/15/2018 Document Reviewed: 05/14/2018 Elsevier Patient Education  2020 [employer].
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
## 2020-03-06 NOTE — Progress Notes (Signed)
Patient verified DOB Patient still complains of cough and aches still. Patient still has taste and smell. Patient at night has ache and coughs. Vomiting episode on yesterday and diarrhea. Patient is able to tolerate food and liquids. Patient complains of fatigue right now. Patient is using the mucinex. Drainage is tanish still.

## 2020-03-06 NOTE — Telephone Encounter (Signed)
Received call from patient stating he tested positive for COVID on 02/29/20, wondering when he can stop isolating. Patient states the testing site advised him to quarantine for 10 days. He states his only symptom currently is muscle aches. RN advised patient to follow guidelines given by testing site and to continue with the 10 day quarantine. Patient verbalized understanding and has no further questions.   Sandie Ano, RN

## 2020-04-11 ENCOUNTER — Other Ambulatory Visit: Payer: Self-pay

## 2020-04-13 ENCOUNTER — Other Ambulatory Visit: Payer: Self-pay

## 2020-04-13 ENCOUNTER — Other Ambulatory Visit: Payer: Self-pay | Admitting: Family

## 2020-04-13 DIAGNOSIS — B20 Human immunodeficiency virus [HIV] disease: Secondary | ICD-10-CM

## 2020-04-18 ENCOUNTER — Other Ambulatory Visit: Payer: Self-pay

## 2020-04-23 ENCOUNTER — Ambulatory Visit: Payer: Self-pay

## 2020-04-23 ENCOUNTER — Other Ambulatory Visit: Payer: Self-pay

## 2020-04-26 ENCOUNTER — Other Ambulatory Visit: Payer: Self-pay | Admitting: Family

## 2020-04-26 ENCOUNTER — Other Ambulatory Visit: Payer: Self-pay

## 2020-04-26 DIAGNOSIS — B20 Human immunodeficiency virus [HIV] disease: Secondary | ICD-10-CM

## 2020-04-27 ENCOUNTER — Telehealth: Payer: Self-pay

## 2020-04-27 LAB — T-HELPER CELL (CD4) - (RCID CLINIC ONLY)
CD4 % Helper T Cell: 30 % — ABNORMAL LOW (ref 33–65)
CD4 T Cell Abs: 560 /uL (ref 400–1790)
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
## 2020-04-28 LAB — COMPREHENSIVE METABOLIC PANEL
AG Ratio: 1.4 (calc) (ref 1.0–2.5)
ALT: 7 U/L — ABNORMAL LOW (ref 9–46)
AST: 15 U/L (ref 10–40)
Albumin: 3.9 g/dL (ref 3.6–5.1)
Alkaline phosphatase (APISO): 41 U/L (ref 36–130)
BUN: 12 mg/dL (ref 7–25)
CO2: 31 mmol/L (ref 20–32)
Calcium: 9 mg/dL (ref 8.6–10.3)
Chloride: 105 mmol/L (ref 98–110)
Creat: 1.01 mg/dL (ref 0.60–1.35)
Globulin: 2.8 g/dL (calc) (ref 1.9–3.7)
Glucose, Bld: 93 mg/dL (ref 65–99)
Potassium: 4.8 mmol/L (ref 3.5–5.3)
Sodium: 139 mmol/L (ref 135–146)
Total Bilirubin: 0.4 mg/dL (ref 0.2–1.2)
Total Protein: 6.7 g/dL (ref 6.1–8.1)

## 2020-04-28 LAB — HIV-1 RNA QUANT-NO REFLEX-BLD
HIV 1 RNA Quant: <20 Copies/mL — ABNORMAL HIGH
HIV-1 RNA Quant, Log: <1.3 Log cps/mL — ABNORMAL HIGH

## 2020-05-04 ENCOUNTER — Encounter: Payer: Self-pay | Admitting: Family

## 2020-05-04 ENCOUNTER — Other Ambulatory Visit: Payer: Self-pay

## 2020-05-04 ENCOUNTER — Ambulatory Visit: Payer: Self-pay

## 2020-05-04 ENCOUNTER — Ambulatory Visit (INDEPENDENT_AMBULATORY_CARE_PROVIDER_SITE_OTHER): Payer: Self-pay | Admitting: Family

## 2020-05-04 VITALS — BP 118/81 | HR 75 | Wt 124.0 lb

## 2020-05-04 DIAGNOSIS — Z79899 Other long term (current) drug therapy: Secondary | ICD-10-CM

## 2020-05-04 DIAGNOSIS — Z113 Encounter for screening for infections with a predominantly sexual mode of transmission: Secondary | ICD-10-CM

## 2020-05-04 DIAGNOSIS — Z0000 Encounter for general adult medical examination without abnormal findings: Secondary | ICD-10-CM

## 2020-05-04 DIAGNOSIS — B20 Human immunodeficiency virus [HIV] disease: Secondary | ICD-10-CM

## 2020-05-04 MED ORDER — BIKTARVY 50-200-25 MG PO TABS: 1.0000 | ORAL_TABLET | Freq: Every day | ORAL | 5 refills | Status: DC

## 2020-05-04 NOTE — Progress Notes (Signed)
Subjective:    Patient ID: Rodney Chase, male    DOB: 24-Sep-1975, 45 y.o.   MRN: [id number]  Chief Complaint  Patient presents with  . Follow-up    Offered condoms; reports eye problems;      HPI:  Rodney Chase is a 45 y.o. male with HIV disease last seen on 02/28/2020 through telehealth visit with improved adherence to his ART regimen of Biktarvy.  Blood work on 01/03/2020 with viral load of 66,800 with CD4 count of 478.  Genotype with no significant resistance.  Most recent blood work completed on 04/26/30 with viral load that was undetectable and CD4 count of 560.  Here today for routine follow-up.  Rodney Chase continues to take his Biktarvy daily as prescribed with no adverse side effects or missed doses since his last office visit.  Overall feeling well today with concern for some changes in vision recently and described as bright lights giving him headaches and migraines especially at night with headlights.  He is using dark glasses which seemed to help.  Interested in a eye evaluation.  Denies fevers, chills, night sweats, neck pain/stiffness, nausea, diarrhea, vomiting, lesions or rashes.  Rodney Chase has no problems obtaining medication from the pharmacy and remains covered through [employer].  Has been having some feelings of being down and depressed since the passing of his father recently in September.  Has considered counseling and is still deciding.  He is working on finalizing his father's estate.  Continues to smoke tobacco on occasion, marijuana daily, and no alcohol consumption.  Condoms offered.  Due for routine dental. Declines Covid vaccine.   Allergies  Allergen Reactions  . Penicillins     Has patient had a PCN reaction causing immediate rash, facial/tongue/throat swelling, SOB or lightheadedness with hypotension: YES Has patient had a PCN reaction causing severe rash involving mucus membranes or skin necrosis: NO Has patient had a PCN reaction that required hospitalization:  UNK Has patient had a PCN reaction occurring within the last 10 years: NO If all of the above answers are "NO", then may proceed with Cephalosporin use.      Outpatient Medications Prior to Visit  Medication Sig Dispense Refill  . bictegravir-emtricitabine-tenofovir AF (BIKTARVY) 50-200-25 MG TABS tablet Take 1 tablet by mouth daily. 30 tablet 3  . Ensure (ENSURE) Take 1 Can by mouth daily. 7110 mL 5   No facility-administered medications prior to visit.     Past Medical History:  Diagnosis Date  . HIV (human immunodeficiency virus infection) (HCC)   . Tobacco use      No past surgical history on file.     Review of Systems  Constitutional: Negative for appetite change, chills, fatigue, fever and unexpected weight change.  Eyes: Negative for visual disturbance.  Respiratory: Negative for cough, chest tightness, shortness of breath and wheezing.   Cardiovascular: Negative for chest pain and leg swelling.  Gastrointestinal: Negative for abdominal pain, constipation, diarrhea, nausea and vomiting.  Genitourinary: Negative for dysuria, flank pain, frequency, genital sores, hematuria and urgency.  Skin: Negative for rash.  Allergic/Immunologic: Negative for immunocompromised state.  Neurological: Negative for dizziness and headaches.      Objective:    BP 118/81   Pulse 75   Wt 124 lb (56.2 kg)   BMI 21.97 kg/m  Nursing note and vital signs reviewed.  Physical Exam Constitutional:      General: He is not in acute distress.    Appearance: He is well-developed.  HENT:  Mouth/Throat:     Mouth: Oropharynx is clear and moist.  Eyes:     Conjunctiva/sclera: Conjunctivae normal.  Cardiovascular:     Rate and Rhythm: Normal rate and regular rhythm.     Pulses: Intact distal pulses.     Heart sounds: Normal heart sounds. No murmur heard. No friction rub. No gallop.   Pulmonary:     Effort: Pulmonary effort is normal. No respiratory distress.     Breath sounds:  Normal breath sounds. No wheezing or rales.  Chest:     Chest wall: No tenderness.  Abdominal:     General: Bowel sounds are normal.     Palpations: Abdomen is soft.     Tenderness: There is no abdominal tenderness.  Musculoskeletal:     Cervical back: Neck supple.  Lymphadenopathy:     Cervical: No cervical adenopathy.  Skin:    General: Skin is warm and dry.     Findings: No rash.  Neurological:     Mental Status: He is alert and oriented to person, place, and time.  Psychiatric:        Mood and Affect: Mood and affect normal.        Behavior: Behavior normal.        Thought Content: Thought content normal.        Judgment: Judgment normal.      Depression screen [facility] 2/9 05/04/2020 02/28/2020 01/03/2020 07/19/2018 01/05/2018  Decreased Interest 0 3 3 0 0  Down, Depressed, Hopeless 0 1 1 0 0  PHQ - 2 Score 0 4 4 0 0  Altered sleeping - 3 3 - -  Tired, decreased energy - 3 0 - -  Change in appetite - 3 1 - -  Feeling bad or failure about yourself  - 1 0 - -  Trouble concentrating - 3 0 - -  Moving slowly or fidgety/restless - 3 1 - -  Suicidal thoughts - 0 0 - -  PHQ-9 Score - 20 9 - -  Difficult doing work/chores - Extremely dIfficult Extremely dIfficult - -       Assessment & Plan:    Patient Active Problem List   Diagnosis Date Noted  . COVID-19 in immunocompromised patient (HCC) 03/01/2020  . Healthcare maintenance 06/21/2019  . Elevated blood sugar level 03/05/2018  . Spasm of cervical paraspinous muscle 01/05/2018  . Essential hypertension 11/01/2014  . Hidradenitis suppurativa 11/01/2014  . Poor dentition 10/19/2013  . 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 Lymphadenopathy, inguinal 01/21/2013  . Onychomycosis due to dermatophyte 02/04/2011  . TOBACCO USER 04/01/2010  . 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 Anxiety state 03/02/2007  . EPIGASTRIC PAIN 11/10/2006  . Human immunodeficiency virus (HIV) disease (HCC) 03/27/2006  . HEADACHE 03/27/2006     Problem List Items  Addressed This Visit   None      I am having [name] maintain his Biktarvy and Ensure.   No orders of the defined types were placed in this encounter.    Follow-up: No follow-ups on file.   Marcos Eke, MSN, FNP-C Nurse Practitioner [facility] Main number: [phone]

## 2020-05-04 NOTE — Patient Instructions (Addendum)
Nice to see you.  We will continue with your Biktarvy daily.   Refills will be sent to the pharmacy.   Please call [facility]) to schedule/follow up on your dental care at [phone]  Plan for follow up in 4 months or sooner if needed. Have a great day and stay safe!

## 2020-05-04 NOTE — Assessment & Plan Note (Addendum)
   Discussed importance of safe sexual practice to reduce risk of STI.  Condoms provided.  Routine dental care recommended.  Information for [facility] dental clinic in after visit summary.  Discussed/recommended Covid vaccination.  Recommended evaluation by ophthalmology/optometry.

## 2020-05-04 NOTE — Assessment & Plan Note (Signed)
Mr. Sollenberger has well-controlled HIV disease with good adherence and tolerance to his ART regimen of Biktarvy.  No signs/symptoms of opportunistic infection or progressive HIV disease.  We reviewed lab work and discussed plan of care.  Encouraged to continue to take medication daily as prescribed.  Renew financial assistance today.  Plan for follow-up in 4 months or sooner if needed with lab work 1 to 2 weeks prior to appointment.

## 2020-06-04 ENCOUNTER — Telehealth: Payer: Self-pay

## 2020-06-04 NOTE — Telephone Encounter (Signed)
Patient called stating he saw a commercial for Cabenuva on TV and is interested in switching. RN advised patient he can discuss potential medication switch at his next appointment with Tammy Sours. Patient verbalized understanding.   RN gave patient phone number for dental at his request.   Sandie Ano, RN

## 2020-07-04 ENCOUNTER — Telehealth: Payer: Self-pay

## 2020-07-04 NOTE — Telephone Encounter (Signed)
Agree with plan. May need to be seen at Urgent Care.

## 2020-07-04 NOTE — Telephone Encounter (Signed)
I have advised patient he will need to go to [facility] or ED. Patient also requested for COVID testing sites. I have provided patient with COVID testing site information.

## 2020-07-04 NOTE — Telephone Encounter (Addendum)
Patient called complaining of intermittent stomach pain that feels like someone is stabbing him in the stomach and pain in his stomach when he coughs x 2 weeks. Patient states he has had some nausea with the stomach pain. Patient advised he could go to the ED if pain is unbearable and I will still forward a message to [place].   Patient also reports COVID exposure Monday. Patient has been advised he can do an at home COVID test or go to a COVID testing site.

## 2020-07-08 ENCOUNTER — Emergency Department (HOSPITAL_BASED_OUTPATIENT_CLINIC_OR_DEPARTMENT_OTHER): Payer: Self-pay

## 2020-07-08 ENCOUNTER — Other Ambulatory Visit: Payer: Self-pay

## 2020-07-08 ENCOUNTER — Encounter (HOSPITAL_BASED_OUTPATIENT_CLINIC_OR_DEPARTMENT_OTHER): Payer: Self-pay | Admitting: Emergency Medicine

## 2020-07-08 ENCOUNTER — Emergency Department (HOSPITAL_BASED_OUTPATIENT_CLINIC_OR_DEPARTMENT_OTHER)
Admission: EM | Admit: 2020-07-08 | Discharge: 2020-07-08 | Disposition: A | Discharge status: Home / Self Care | Payer: Self-pay | Attending: Emergency Medicine | Admitting: Emergency Medicine

## 2020-07-08 DIAGNOSIS — F17210 Nicotine dependence, cigarettes, uncomplicated: Secondary | ICD-10-CM | POA: Insufficient documentation

## 2020-07-08 DIAGNOSIS — R1030 Lower abdominal pain, unspecified: Secondary | ICD-10-CM | POA: Insufficient documentation

## 2020-07-08 DIAGNOSIS — U071 COVID-19: Secondary | ICD-10-CM

## 2020-07-08 DIAGNOSIS — Z21 Asymptomatic human immunodeficiency virus [HIV] infection status: Secondary | ICD-10-CM | POA: Insufficient documentation

## 2020-07-08 DIAGNOSIS — I10 Essential (primary) hypertension: Secondary | ICD-10-CM | POA: Insufficient documentation

## 2020-07-08 DIAGNOSIS — J189 Pneumonia, unspecified organism: Secondary | ICD-10-CM

## 2020-07-08 DIAGNOSIS — Z8616 Personal history of COVID-19: Secondary | ICD-10-CM | POA: Insufficient documentation

## 2020-07-08 LAB — CBC WITH DIFFERENTIAL/PLATELET
Abs Immature Granulocytes: 0 10*3/uL (ref 0.00–0.07)
Basophils Absolute: 0 10*3/uL (ref 0.0–0.1)
Basophils Relative: 0 %
Eosinophils Absolute: 0.1 10*3/uL (ref 0.0–0.5)
Eosinophils Relative: 1 %
HCT: 42.4 % (ref 39.0–52.0)
Hemoglobin: 14 g/dL (ref 13.0–17.0)
Immature Granulocytes: 0 %
Lymphocytes Relative: 55 %
Lymphs Abs: 2.9 10*3/uL (ref 0.7–4.0)
MCH: 28.5 pg (ref 26.0–34.0)
MCHC: 33 g/dL (ref 30.0–36.0)
MCV: 86.2 fL (ref 80.0–100.0)
Monocytes Absolute: 0.5 10*3/uL (ref 0.1–1.0)
Monocytes Relative: 9 %
Neutro Abs: 1.9 10*3/uL (ref 1.7–7.7)
Neutrophils Relative %: 35 %
Platelets: 187 10*3/uL (ref 150–400)
RBC: 4.92 MIL/uL (ref 4.22–5.81)
RDW: 14.1 % (ref 11.5–15.5)
WBC: 5.3 10*3/uL (ref 4.0–10.5)
nRBC: 0 % (ref 0.0–0.2)

## 2020-07-08 LAB — COMPREHENSIVE METABOLIC PANEL
ALT: 14 U/L (ref 0–44)
AST: 20 U/L (ref 15–41)
Albumin: 3.7 g/dL (ref 3.5–5.0)
Alkaline Phosphatase: 39 U/L (ref 38–126)
Anion gap: 7 (ref 5–15)
BUN: 14 mg/dL (ref 6–20)
CO2: 29 mmol/L (ref 22–32)
Calcium: 9 mg/dL (ref 8.9–10.3)
Chloride: 101 mmol/L (ref 98–111)
Creatinine, Ser: 0.98 mg/dL (ref 0.61–1.24)
GFR, Estimated: >60 mL/min (ref >60)
Glucose, Bld: 98 mg/dL (ref 70–99)
Potassium: 4 mmol/L (ref 3.5–5.1)
Sodium: 137 mmol/L (ref 135–145)
Total Bilirubin: 0.2 mg/dL — ABNORMAL LOW (ref 0.3–1.2)
Total Protein: 7.2 g/dL (ref 6.5–8.1)

## 2020-07-08 LAB — LIPASE, BLOOD: Lipase: 31 U/L (ref 11–51)

## 2020-07-08 LAB — URINALYSIS, ROUTINE W REFLEX MICROSCOPIC
Bilirubin Urine: NEGATIVE
Glucose, UA: NEGATIVE mg/dL
Hgb urine dipstick: NEGATIVE
Ketones, ur: NEGATIVE mg/dL
Leukocytes,Ua: NEGATIVE
Nitrite: NEGATIVE
Protein, ur: NEGATIVE mg/dL
Specific Gravity, Urine: 1.015 (ref 1.005–1.030)
pH: 7 (ref 5.0–8.0)

## 2020-07-08 MED ORDER — LEVOFLOXACIN 750 MG PO TABS: 750.0000 mg | ORAL_TABLET | Freq: Every day | ORAL | 0 refills | Status: DC

## 2020-07-08 MED ORDER — LACTATED RINGERS IV BOLUS
1000.0000 mL | Freq: Once | INTRAVENOUS | Status: AC
  Administered 2020-07-08: 1000 mL via INTRAVENOUS

## 2020-07-08 MED ORDER — LEVOFLOXACIN 750 MG PO TABS
750.0000 mg | ORAL_TABLET | Freq: Once | ORAL | Status: AC
  Administered 2020-07-08: 750 mg via ORAL
  Filled 2020-07-08: qty 1

## 2020-07-08 MED ORDER — IOHEXOL 300 MG/ML  SOLN
100.0000 mL | Freq: Once | INTRAMUSCULAR | Status: AC
  Administered 2020-07-08: 100 mL via INTRAVENOUS

## 2020-07-08 MED ORDER — ONDANSETRON HCL 4 MG/2ML IJ SOLN
4.0000 mg | Freq: Once | INTRAMUSCULAR | Status: AC
  Administered 2020-07-08: 4 mg via INTRAVENOUS
  Filled 2020-07-08: qty 2

## 2020-07-08 NOTE — ED Provider Notes (Signed)
[facility] EMERGENCY DEPARTMENT Provider Note   CSN: [id number] Arrival date & time: 07/08/20  1338     History Chief Complaint  Patient presents with  . Abdominal Pain    Layla Gramm is a 45 y.o. male.  It is a 45 year old male with a history of HIV on antiretroviral therapy, hypertension and daily marijuana use who is presenting today with complaint of abdominal pain that radiates to his left flank.  He reports pain has been intermittent for the last 2 weeks but is slowly getting worse.  He has had some congestion and coughing as well that has been coming and going.  He has had nausea but denies any vomiting.  No known fever.  Denies any dysuria, frequency or urgency.  No penile discharge.  Last sexually active 3 weeks ago and uses protection every time.  He has no testicular pain or swelling.  He has not noticed any blood in his urine.  He has been eating normally.  He was exposed to a family member 1 week ago who had COVID and when he did a home test it did test positive earlier today.  He denies any shortness of breath.  No alcohol use.  He does take his antiretroviral therapy daily.  2 months ago when his levels were checked his viral counts were low less than 20 and CD4 counts were normal.  The history is provided by the patient.  Abdominal Pain      Past Medical History:  Diagnosis Date  . HIV (human immunodeficiency virus infection) (HCC)   . Tobacco use     Patient Active Problem List   Diagnosis Date Noted  . COVID-19 in immunocompromised patient (HCC) 03/01/2020  . Healthcare maintenance 06/21/2019  . Elevated blood sugar level 03/05/2018  . Spasm of cervical paraspinous muscle 01/05/2018  . Essential hypertension 11/01/2014  . Hidradenitis suppurativa 11/01/2014  . Poor dentition 10/19/2013  . 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 Lymphadenopathy, inguinal 01/21/2013  . Onychomycosis due to dermatophyte 02/04/2011  . TOBACCO USER 04/01/2010  . LOSS OF APPETITE  06/25/2009  . Anxiety state 03/02/2007  . EPIGASTRIC PAIN 11/10/2006  . Human immunodeficiency virus (HIV) disease (HCC) 03/27/2006  . HEADACHE 03/27/2006    History reviewed. No pertinent surgical history.     Family History  Problem Relation Age of Onset  . Diabetes Daughter   . Breast cancer Mother        breast  . Cancer Maternal Grandmother   . Diabetes Paternal Grandmother   . Lung cancer Maternal Aunt     Social History   Tobacco Use  . Smoking status: Current Every Day Smoker    Packs/day: 0.20    Types: Cigarettes  . Smokeless tobacco: Never Used  Vaping Use  . Vaping Use: Never used  Substance Use Topics  . Alcohol use: No    Alcohol/week: 0.0 standard drinks  . Drug use: Yes    Types: Marijuana    Comment: daily     Home Medications Prior to Admission medications   Medication Sig Start Date End Date Taking? Authorizing Provider  bictegravir-emtricitabine-tenofovir AF (BIKTARVY) 50-200-25 MG TABS tablet Take 1 tablet by mouth daily. 05/04/20   Veryl Speak, FNP  Ensure (ENSURE) Take 1 Can by mouth daily. 02/28/20   Veryl Speak, FNP    Allergies    Penicillins  Review of Systems   Review of Systems  Gastrointestinal: Positive for abdominal pain.  All other systems reviewed and are  negative.   Physical Exam Updated Vital Signs BP (!) 142/89 (BP Location: Right Arm)   Pulse 87   Temp 98.2 F (36.8 C) (Oral)   Resp 16   Ht 5\' 4"  (1.626 m)   Wt 56.7 kg   SpO2 100%   BMI 21.46 kg/m   Physical Exam Vitals and nursing note reviewed.  Constitutional:      General: He is not in acute distress.    Appearance: He is well-developed.  HENT:     Head: Normocephalic and atraumatic.  Eyes:     Extraocular Movements: Extraocular movements intact.     Conjunctiva/sclera: Conjunctivae normal.     Pupils: Pupils are equal, round, and reactive to light.  Cardiovascular:     Rate and Rhythm: Normal rate and regular rhythm.     Heart sounds:  No murmur heard.   Pulmonary:     Effort: Pulmonary effort is normal. No respiratory distress.     Breath sounds: Normal breath sounds. No wheezing or rales.  Abdominal:     General: There is no distension.     Palpations: Abdomen is soft.     Tenderness: There is abdominal tenderness in the suprapubic area. There is left CVA tenderness. There is no right CVA tenderness, guarding or rebound.     Hernia: No hernia is present.  Musculoskeletal:        General: No tenderness. Normal range of motion.     Cervical back: Normal range of motion and neck supple.  Skin:    General: Skin is warm and dry.     Findings: No erythema or rash.  Neurological:     General: No focal deficit present.     Mental Status: He is alert and oriented to person, place, and time.  Psychiatric:        Mood and Affect: Mood normal.        Behavior: Behavior normal.     ED Results / Procedures / Treatments   Labs (all labs ordered are listed, but only abnormal results are displayed) Labs Reviewed  CBC WITH DIFFERENTIAL/PLATELET  COMPREHENSIVE METABOLIC PANEL  LIPASE, BLOOD  URINALYSIS, ROUTINE W REFLEX MICROSCOPIC    EKG None  Radiology No results found.  Procedures Procedures   Medications Ordered in ED Medications  lactated ringers bolus 1,000 mL (has no administration in time range)  ondansetron (ZOFRAN) injection 4 mg (has no administration in time range)    ED Course  I have reviewed the triage vital signs and the nursing notes.  Pertinent labs & imaging results that were available during my care of the patient were reviewed by me and considered in my medical decision making (see chart for details).    MDM Rules/Calculators/A&P                          Patient is a 45 year old male presenting today with symptoms of abdominal pain that radiate to the left flank.  This pain has been present intermittently for the last 2 weeks but seems to be getting worse.  It is made worse with cough  and certain movements.  He denies any urinary symptoms.  He has had nausea but denies any bowel changes.  No vomiting.  Patient is HIV positive but is taking his medication regularly.  Last labs were reassuring.  Also patient has had cough and congestion intermittently for the last 3 weeks.  He did take a COVID test today and tested  positive however his last known positive contact was over a week ago.  Patient cannot reliably say his symptoms started within the last 5 days.  Do not feel like he is a candidate for COVID antivirals.  Labs are pending.  Concern for possible pancreatitis, kidney stone, pyelonephritis.  Low suspicion for pneumonia at this time but given cough we will do a chest x-ray. Final Clinical Impression(s) / ED Diagnoses Final diagnoses:  None    Rx / DC Orders ED Discharge Orders    None       Gwyneth Sprout, MD 07/08/20 1500

## 2020-07-08 NOTE — ED Triage Notes (Signed)
Pt arrives pov with c/o chills, mid abdominal pain and left flank pain x 2 weeks. Pt denies dysuria. Pt also reports Covid + home test pta after exposure to family member.

## 2020-07-08 NOTE — ED Notes (Signed)
Patient transported to X-ray 

## 2020-07-08 NOTE — ED Provider Notes (Signed)
  Physical Exam  BP (!) 142/89 (BP Location: Right Arm)   Pulse 87   Temp 98.2 F (36.8 C) (Oral)   Resp 16   Ht 5\' 4"  (1.626 m)   Wt 56.7 kg   SpO2 100%   BMI 21.46 kg/m   Physical Exam  ED Course/Procedures     Procedures  MDM  Care assumed at 3 PM.  Patient has a history of HIV that is well controlled.  He has COVID exposure about a week ago and tested himself with COVID and was positive today.  Patient is having some abdominal pain further 4 weeks and some nonproductive cough.  No fevers at home.  Patient is not vaccinated but had COVID previously  3:55 PM CT unremarkable.  Chest x-ray showed possible right lower lobe pneumonia.  I think symptoms likely from COVID versus early pneumonia.  He is afebrile and white blood cell count is normal.  Given that he has a history of HIV, will give Levaquin.  Patient has no oxygen requirement and has minimal symptoms so will not need any specific treatments for COVID right now.  Will refer to the COVID clinic.  Rodney Chase was evaluated in Emergency Department on 07/08/2020 for the symptoms described in the history of present illness. He was evaluated in the context of the global COVID-19 pandemic, which necessitated consideration that the patient might be at risk for infection with the SARS-CoV-2 virus that causes COVID-19. Institutional protocols and algorithms that pertain to the evaluation of patients at risk for COVID-19 are in a state of rapid change based on information released by regulatory bodies including the CDC and federal and state organizations. These policies and algorithms were followed during the patient's care in the ED.    09/07/2020, MD 07/08/20 1556

## 2020-07-08 NOTE — ED Notes (Signed)
ED Provider at bedside. 

## 2020-07-08 NOTE — Discharge Instructions (Signed)
Take Levaquin daily for 7 days for pneumonia.  Your symptoms likely from COVID and pneumonia.  Your CT scan of your abdomen is normal.  Stay home per CDC guidelines   Follow-up with COVID clinic and your primary care doctor  Return to ER if you have worse cough, trouble breathing, fever, worse abdominal pain.     Person Under Monitoring Name: Rodney Chase  Location: [address] [place] [address]   Infection Prevention Recommendations for Individuals Confirmed to have, or Being Evaluated for, 2019 Novel Coronavirus (COVID-19) Infection Who Receive Care at Home  Individuals who are confirmed to have, or are being evaluated for, COVID-19 should follow the prevention steps below until a healthcare provider or local or state health department says they can return to normal activities.  Stay home except to get medical care You should restrict activities outside your home, except for getting medical care. Do not go to work, school, or public areas, and do not use public transportation or taxis.  Call ahead before visiting your doctor Before your medical appointment, call the healthcare provider and tell them that you have, or are being evaluated for, COVID-19 infection. This will help the healthcare provider's office take steps to keep other people from getting infected. Ask your healthcare provider to call the local or state health department.  Monitor your symptoms Seek prompt medical attention if your illness is worsening (e.g., difficulty breathing). Before going to your medical appointment, call the healthcare provider and tell them that you have, or are being evaluated for, COVID-19 infection. Ask your healthcare provider to call the local or state health department.  Wear a facemask You should wear a facemask that covers your nose and mouth when you are in the same room with other people and when you visit a healthcare provider. People who live with or visit you  should also wear a facemask while they are in the same room with you.  Separate yourself from other people in your home As much as possible, you should stay in a different room from other people in your home. Also, you should use a separate bathroom, if available.  Avoid sharing household items You should not share dishes, drinking glasses, cups, eating utensils, towels, bedding, or other items with other people in your home. After using these items, you should wash them thoroughly with soap and water.  Cover your coughs and sneezes Cover your mouth and nose with a tissue when you cough or sneeze, or you can cough or sneeze into your sleeve. Throw used tissues in a lined trash can, and immediately wash your hands with soap and water for at least 20 seconds or use an alcohol-based hand rub.  Wash your [employer] your hands often and thoroughly with soap and water for at least 20 seconds. You can use an alcohol-based hand sanitizer if soap and water are not available and if your hands are not visibly dirty. Avoid touching your eyes, nose, and mouth with unwashed hands.   Prevention Steps for Caregivers and Household Members of Individuals Confirmed to have, or Being Evaluated for, COVID-19 Infection Being Cared for in the Home  If you live with, or provide care at home for, a person confirmed to have, or being evaluated for, COVID-19 infection please follow these guidelines to prevent infection:  Follow healthcare provider's instructions Make sure that you understand and can help the patient follow any healthcare provider instructions for all care.  Provide for the patient's basic needs You  should help the patient with basic needs in the home and provide support for getting groceries, prescriptions, and other personal needs.  Monitor the patient's symptoms If they are getting sicker, call his or her medical provider and tell them that the patient has, or is being evaluated  for, COVID-19 infection. This will help the healthcare provider's office take steps to keep other people from getting infected. Ask the healthcare provider to call the local or state health department.  Limit the number of people who have contact with the patient If possible, have only one caregiver for the patient. Other household members should stay in another home or place of residence. If this is not possible, they should stay in another room, or be separated from the patient as much as possible. Use a separate bathroom, if available. Restrict visitors who do not have an essential need to be in the home.  Keep older adults, very young children, and other sick people away from the patient Keep older adults, very young children, and those who have compromised immune systems or chronic health conditions away from the patient. This includes people with chronic heart, lung, or kidney conditions, diabetes, and cancer.  Ensure good ventilation Make sure that shared spaces in the home have good air flow, such as from an air conditioner or an opened window, weather permitting.  Wash your hands often Wash your hands often and thoroughly with soap and water for at least 20 seconds. You can use an alcohol based hand sanitizer if soap and water are not available and if your hands are not visibly dirty. Avoid touching your eyes, nose, and mouth with unwashed hands. Use disposable paper towels to dry your hands. If not available, use dedicated cloth towels and replace them when they become wet.  Wear a facemask and gloves Wear a disposable facemask at all times in the room and gloves when you touch or have contact with the patient's blood, body fluids, and/or secretions or excretions, such as sweat, saliva, sputum, nasal mucus, vomit, urine, or feces.  Ensure the mask fits over your nose and mouth tightly, and do not touch it during use. Throw out disposable facemasks and gloves after using them. Do not  reuse. Wash your hands immediately after removing your facemask and gloves. If your personal clothing becomes contaminated, carefully remove clothing and launder. Wash your hands after handling contaminated clothing. Place all used disposable facemasks, gloves, and other waste in a lined container before disposing them with other household waste. Remove gloves and wash your hands immediately after handling these items.  Do not share dishes, glasses, or other household items with the patient Avoid sharing household items. You should not share dishes, drinking glasses, cups, eating utensils, towels, bedding, or other items with a patient who is confirmed to have, or being evaluated for, COVID-19 infection. After the person uses these items, you should wash them thoroughly with soap and water.  Wash laundry thoroughly Immediately remove and wash clothes or bedding that have blood, body fluids, and/or secretions or excretions, such as sweat, saliva, sputum, nasal mucus, vomit, urine, or feces, on them. Wear gloves when handling laundry from the patient. Read and follow directions on labels of laundry or clothing items and detergent. In general, wash and dry with the warmest temperatures recommended on the label.  Clean all areas the individual has used often Clean all touchable surfaces, such as counters, tabletops, doorknobs, bathroom fixtures, toilets, phones, keyboards, tablets, and bedside tables, every day. Also, clean  any surfaces that may have blood, body fluids, and/or secretions or excretions on them. Wear gloves when cleaning surfaces the patient has come in contact with. Use a diluted bleach solution (e.g., dilute bleach with 1 part bleach and 10 parts water) or a household disinfectant with a label that says EPA-registered for coronaviruses. To make a bleach solution at home, add 1 tablespoon of bleach to 1 quart (4 cups) of water. For a larger supply, add  cup of bleach to 1 gallon (16  cups) of water. Read labels of cleaning products and follow recommendations provided on product labels. Labels contain instructions for safe and effective use of the cleaning product including precautions you should take when applying the product, such as wearing gloves or eye protection and making sure you have good ventilation during use of the product. Remove gloves and wash hands immediately after cleaning.  Monitor yourself for signs and symptoms of illness Caregivers and household members are considered close contacts, should monitor their health, and will be asked to limit movement outside of the home to the extent possible. Follow the monitoring steps for close contacts listed on the symptom monitoring form.   ? If you have additional questions, contact your local health department or call the epidemiologist on call at [phone] (available 24/7). ? This guidance is subject to change. For the most up-to-date guidance from [facility], please refer to their website: [email or web address]

## 2020-07-15 ENCOUNTER — Encounter (HOSPITAL_COMMUNITY): Payer: Self-pay | Admitting: Emergency Medicine

## 2020-07-15 ENCOUNTER — Other Ambulatory Visit: Payer: Self-pay

## 2020-07-15 ENCOUNTER — Ambulatory Visit (HOSPITAL_COMMUNITY)
Admission: EM | Admit: 2020-07-15 | Discharge: 2020-07-15 | Disposition: A | Discharge status: Home / Self Care | Payer: Self-pay

## 2020-07-15 ENCOUNTER — Emergency Department (HOSPITAL_COMMUNITY)
Admission: EM | Admit: 2020-07-15 | Discharge: 2020-07-15 | Disposition: A | Discharge status: Home / Self Care | Payer: Self-pay | Attending: Emergency Medicine | Admitting: Emergency Medicine

## 2020-07-15 ENCOUNTER — Emergency Department (HOSPITAL_COMMUNITY): Payer: Self-pay

## 2020-07-15 DIAGNOSIS — S61215A Laceration without foreign body of left ring finger without damage to nail, initial encounter: Secondary | ICD-10-CM | POA: Insufficient documentation

## 2020-07-15 DIAGNOSIS — Y93E5 Activity, floor mopping and cleaning: Secondary | ICD-10-CM | POA: Insufficient documentation

## 2020-07-15 DIAGNOSIS — U071 COVID-19: Secondary | ICD-10-CM | POA: Insufficient documentation

## 2020-07-15 DIAGNOSIS — Z5321 Procedure and treatment not carried out due to patient leaving prior to being seen by health care provider: Secondary | ICD-10-CM | POA: Insufficient documentation

## 2020-07-15 DIAGNOSIS — W268XXA Contact with other sharp object(s), not elsewhere classified, initial encounter: Secondary | ICD-10-CM | POA: Insufficient documentation

## 2020-07-15 NOTE — ED Triage Notes (Signed)
Pt sent by UC for further evaluation of laceration to left ring finger. States it happened today when a metal broom broke in half while he was sweeping. Bleeding controlled at this time. States he had a +covid test at home 5/9.

## 2020-07-15 NOTE — ED Triage Notes (Addendum)
Pt presents with left ring finger laceration that happened with metal broom earlier today.   Patient is being discharged from the Urgent Care and sent to the Emergency Department via wheelchair with staff . Per Karie Schwalbe NP, patient is in need of higher level of care due to left ring finger laceration and numbness. Patient is aware and verbalizes understanding of plan of care.  Vitals:   07/15/20 1545  BP: 120/80  Pulse: 80  Resp: 16  SpO2: 100%

## 2020-07-15 NOTE — ED Notes (Signed)
Pt called for room assignment with no response

## 2020-07-16 ENCOUNTER — Emergency Department (HOSPITAL_BASED_OUTPATIENT_CLINIC_OR_DEPARTMENT_OTHER)
Admission: EM | Admit: 2020-07-16 | Discharge: 2020-07-16 | Disposition: A | Discharge status: Home / Self Care | Payer: Self-pay | Attending: Emergency Medicine | Admitting: Emergency Medicine

## 2020-07-16 ENCOUNTER — Encounter (HOSPITAL_BASED_OUTPATIENT_CLINIC_OR_DEPARTMENT_OTHER): Payer: Self-pay | Admitting: *Deleted

## 2020-07-16 ENCOUNTER — Other Ambulatory Visit: Payer: Self-pay

## 2020-07-16 DIAGNOSIS — W268XXD Contact with other sharp object(s), not elsewhere classified, subsequent encounter: Secondary | ICD-10-CM | POA: Insufficient documentation

## 2020-07-16 DIAGNOSIS — I10 Essential (primary) hypertension: Secondary | ICD-10-CM | POA: Insufficient documentation

## 2020-07-16 DIAGNOSIS — Z8616 Personal history of COVID-19: Secondary | ICD-10-CM | POA: Insufficient documentation

## 2020-07-16 DIAGNOSIS — B20 Human immunodeficiency virus [HIV] disease: Secondary | ICD-10-CM | POA: Insufficient documentation

## 2020-07-16 DIAGNOSIS — S61315A Laceration without foreign body of left ring finger with damage to nail, initial encounter: Secondary | ICD-10-CM | POA: Insufficient documentation

## 2020-07-16 DIAGNOSIS — Z23 Encounter for immunization: Secondary | ICD-10-CM | POA: Insufficient documentation

## 2020-07-16 DIAGNOSIS — F17210 Nicotine dependence, cigarettes, uncomplicated: Secondary | ICD-10-CM | POA: Insufficient documentation

## 2020-07-16 MED ORDER — TETANUS-DIPHTH-ACELL PERTUSSIS 5-2.5-18.5 LF-MCG/0.5 IM SUSY: 0.5000 mL | PREFILLED_SYRINGE | Freq: Once | INTRAMUSCULAR | Status: DC

## 2020-07-16 MED ORDER — DOXYCYCLINE HYCLATE 100 MG PO CAPS: 100.0000 mg | ORAL_CAPSULE | Freq: Two times a day (BID) | ORAL | 0 refills | Status: DC

## 2020-07-16 MED ORDER — KETOROLAC TROMETHAMINE 15 MG/ML IJ SOLN: 15.0000 mg | Freq: Once | INTRAMUSCULAR | Status: DC

## 2020-07-16 MED ORDER — TETANUS-DIPHTH-ACELL PERTUSSIS 5-2.5-18.5 LF-MCG/0.5 IM SUSY
0.5000 mL | PREFILLED_SYRINGE | Freq: Once | INTRAMUSCULAR | Status: AC
  Administered 2020-07-16: 0.5 mL via INTRAMUSCULAR
  Filled 2020-07-16: qty 0.5

## 2020-07-16 MED ORDER — BACITRACIN ZINC 500 UNIT/GM EX OINT
TOPICAL_OINTMENT | Freq: Two times a day (BID) | CUTANEOUS | Status: DC
  Administered 2020-07-16: 1 via TOPICAL
  Filled 2020-07-16: qty 28.35

## 2020-07-16 MED ORDER — KETOROLAC TROMETHAMINE 15 MG/ML IJ SOLN
15.0000 mg | Freq: Once | INTRAMUSCULAR | Status: AC
  Administered 2020-07-16: 15 mg via INTRAMUSCULAR
  Filled 2020-07-16: qty 1

## 2020-07-16 MED ORDER — CLINDAMYCIN HCL 150 MG PO CAPS: 150.0000 mg | ORAL_CAPSULE | Freq: Three times a day (TID) | ORAL | 0 refills | Status: DC

## 2020-07-16 NOTE — Discharge Instructions (Signed)
Please make sure that you follow-up with the hand provider.  Keep the area clean and dry.  Take the antibiotics as prescribed.  Watch for signs of infection.

## 2020-07-16 NOTE — ED Triage Notes (Signed)
States he has an open fx of his left ring finger. He left [facility] without being seen yesterday. States he is Covid positive.

## 2020-07-16 NOTE — ED Provider Notes (Signed)
[facility] EMERGENCY DEPARTMENT Provider Note   CSN: [id number] Arrival date & time: 07/16/20  1228     History Chief Complaint  Patient presents with  . Finger Injury    Rodney Chase is a 45 y.o. male.  HPI 45 year old presents to the emergency department today for evaluation of laceration to the left ring finger.  Patient reports that yesterday morning he sustained a laceration to his left ring finger while using a metal broom.  Patient reports significant pain to the area.  Was seen in urgent care yesterday and sent to the ER for further evaluation.  X-ray was performed that showed no fracture.  Patient reports that he left out being seen for several hours in the ER.  Patient has not taken anything for pain.  Reports some numbness to the area.    Past Medical History:  Diagnosis Date  . HIV (human immunodeficiency virus infection) (HCC)   . Tobacco use     Patient Active Problem List   Diagnosis Date Noted  . COVID-19 in immunocompromised patient (HCC) 03/01/2020  . Healthcare maintenance 06/21/2019  . Elevated blood sugar level 03/05/2018  . Spasm of cervical paraspinous muscle 01/05/2018  . Essential hypertension 11/01/2014  . Hidradenitis suppurativa 11/01/2014  . Poor dentition 10/19/2013  . 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 Lymphadenopathy, inguinal 01/21/2013  . Onychomycosis due to dermatophyte 02/04/2011  . TOBACCO USER 04/01/2010  . 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 Anxiety state 03/02/2007  . EPIGASTRIC PAIN 11/10/2006  . Human immunodeficiency virus (HIV) disease (HCC) 03/27/2006  . HEADACHE 03/27/2006    History reviewed. No pertinent surgical history.     Family History  Problem Relation Age of Onset  . Diabetes Daughter   . Breast cancer Mother        breast  . Cancer Maternal Grandmother   . Diabetes Paternal Grandmother   . Lung cancer Maternal Aunt     Social History   Tobacco Use  . Smoking status: Current Every Day Smoker     Packs/day: 0.20    Types: Cigarettes  . Smokeless tobacco: Never Used  Vaping Use  . Vaping Use: Never used  Substance Use Topics  . Alcohol use: No    Alcohol/week: 0.0 standard drinks  . Drug use: Yes    Types: Marijuana    Comment: daily     Home Medications Prior to Admission medications   Medication Sig Start Date End Date Taking? Authorizing Provider  bictegravir-emtricitabine-tenofovir AF (BIKTARVY) 50-200-25 MG TABS tablet Take 1 tablet by mouth daily. 05/04/20   Veryl Speak, FNP  Ensure (ENSURE) Take 1 Can by mouth daily. 02/28/20   Veryl Speak, FNP  levofloxacin (LEVAQUIN) 750 MG tablet Take 1 tablet (750 mg total) by mouth daily. X 7 days 07/08/20   Charlynne Pander, MD    Allergies    Penicillins  Review of Systems   Review of Systems  Constitutional: Negative for chills and fever.  Eyes: Negative for discharge.  Musculoskeletal: Positive for myalgias.  Skin: Positive for wound.  Neurological: Positive for numbness. Negative for headaches.  Psychiatric/Behavioral: Negative for confusion.    Physical Exam Updated Vital Signs BP (!) 117/93 (BP Location: Right Arm)   Pulse 91   Temp 98.7 F (37.1 C) (Oral)   Resp 16   Ht 5\' 4"  (1.626 m)   Wt 56.7 kg   SpO2 100%   BMI 21.46 kg/m   Physical Exam Vitals and nursing note reviewed.  Constitutional:      General: He is not in acute distress.    Appearance: He is well-developed.  HENT:     Head: Normocephalic and atraumatic.  Eyes:     General: No scleral icterus.       Right eye: No discharge.        Left eye: No discharge.  Pulmonary:     Effort: No respiratory distress.  Musculoskeletal:        General: Normal range of motion.     Cervical back: Normal range of motion.  Skin:    Coloration: Skin is not pale.     Comments: Patient with 3 cm laceration over the dorsum of the left ring finger.  Edema noted.  Bleeding is controlled.  No loss foreign body or debris.  Pain with range of  motion.  Neurological:     Mental Status: He is alert.  Psychiatric:        Behavior: Behavior normal.        Thought Content: Thought content normal.        Judgment: Judgment normal.           ED Results / Procedures / Treatments   Labs (all labs ordered are listed, but only abnormal results are displayed) Labs Reviewed - No data to display  EKG None  Radiology DG Hand Complete Left  Result Date: 07/15/2020 CLINICAL DATA:  Metal sliced through his left 4th finger, pressure bandage in place EXAM: LEFT HAND - COMPLETE 3+ VIEW COMPARISON:  None. FINDINGS: There is no evidence of fracture or dislocation. There is no evidence of arthropathy or other focal bone abnormality. Soft tissues are unremarkable. No radiopaque foreign body. IMPRESSION: No acute osseous abnormality in the left hand. Electronically Signed   By: Emmaline Kluver M.D.   On: 07/15/2020 16:43    Procedures Procedures   Medications Ordered in ED Medications  Tdap (BOOSTRIX) injection 0.5 mL (has no administration in time range)    ED Course  I have reviewed the triage vital signs and the nursing notes.  Pertinent labs & imaging results that were available during my care of the patient were reviewed by me and considered in my medical decision making (see chart for details).    MDM Rules/Calculators/A&P                          45 year old presents with laceration that is over 24 hours old to the left ring finger.  Patient was seen in the ER yesterday but left before being evaluated by provider.  Did have x-ray that I personally viewed that showed no evidence of fracture or malalignment.  Tetanus shot was updated in the ER today.  Given the wound is over 24 hours old do not feel that closure is indicated.  We will have patient's wound closed with secondary intent.  Case was discussed with Dr. Izora Ribas with hand surgery.  He recommends antibiotics and close outpatient follow-up with him in his clinic this week.   Wound was dressed in the ER with nonstick dressing and splint was applied.  Pt is hemodynamically stable, in NAD, & able to ambulate in the ED. Evaluation does not show pathology that would require ongoing emergent intervention or inpatient treatment. I explained the diagnosis to the patient. Pt has no complaints prior to dc. Pt is comfortable with above plan and is stable for discharge at this time. All questions were answered prior to disposition. Strict  return precautions for f/u to the ED were discussed. Encouraged follow up with PCP.  Final Clinical Impression(s) / ED Diagnoses Final diagnoses:  Laceration of left ring finger without foreign body with damage to nail, initial encounter    Rx / DC Orders ED Discharge Orders         Ordered    doxycycline (VIBRAMYCIN) 100 MG capsule  2 times daily,   Status:  Discontinued        07/16/20 1528    clindamycin (CLEOCIN) 150 MG capsule  3 times daily        07/16/20 1530           Wallace Keller 07/16/20 1744    Terrilee Files, MD 07/16/20 1914

## 2020-08-24 ENCOUNTER — Telehealth: Payer: Self-pay

## 2020-08-24 NOTE — Telephone Encounter (Signed)
Patient called, states he is having "breathing problems." He complains of coughing and shortness of breath when he is inside as he feels like he is "breathing in heavy fumes." He states it caused a "choking feeling," but that it wore off when he went outside.   Advised him if he feels like there are fumes in the home he needs to go outside and either call 911 or go to the emergency room. He expresses concern over a cut on his hand. Instructed him that his breathing problems take priority and that his hand can also be addressed in the emergency room. He verbalizes understanding and states he will go to the emergency department and call our office after to let us know what they say.   Sandie Ano, RN

## 2020-08-29 ENCOUNTER — Other Ambulatory Visit: Payer: Self-pay | Admitting: Family

## 2020-08-29 DIAGNOSIS — B20 Human immunodeficiency virus [HIV] disease: Secondary | ICD-10-CM

## 2020-09-07 ENCOUNTER — Other Ambulatory Visit: Payer: Self-pay | Admitting: Family

## 2020-09-07 ENCOUNTER — Other Ambulatory Visit: Payer: Self-pay

## 2020-09-07 DIAGNOSIS — B20 Human immunodeficiency virus [HIV] disease: Secondary | ICD-10-CM

## 2020-09-07 NOTE — Addendum Note (Signed)
Addended by: Harley Alto on: 09/07/2020 08:22 AM   Modules accepted: Orders

## 2020-09-10 ENCOUNTER — Other Ambulatory Visit: Payer: Self-pay

## 2020-09-10 DIAGNOSIS — Z79899 Other long term (current) drug therapy: Secondary | ICD-10-CM

## 2020-09-10 DIAGNOSIS — Z113 Encounter for screening for infections with a predominantly sexual mode of transmission: Secondary | ICD-10-CM

## 2020-09-10 DIAGNOSIS — B20 Human immunodeficiency virus [HIV] disease: Secondary | ICD-10-CM

## 2020-09-12 LAB — LIPID PANEL
Cholesterol: 149 mg/dL (ref <200)
HDL: 52 mg/dL (ref >=40)
LDL Cholesterol (Calc): 83 mg/dL (calc)
Non-HDL Cholesterol (Calc): 97 mg/dL (calc) (ref <130)
Total CHOL/HDL Ratio: 2.9 (calc) (ref <5.0)
Triglycerides: 67 mg/dL (ref <150)

## 2020-09-12 LAB — COMPREHENSIVE METABOLIC PANEL
AG Ratio: 1.4 (calc) (ref 1.0–2.5)
ALT: 11 U/L (ref 9–46)
AST: 20 U/L (ref 10–40)
Albumin: 4 g/dL (ref 3.6–5.1)
Alkaline phosphatase (APISO): 46 U/L (ref 36–130)
BUN: 17 mg/dL (ref 7–25)
CO2: 31 mmol/L (ref 20–32)
Calcium: 9.4 mg/dL (ref 8.6–10.3)
Chloride: 104 mmol/L (ref 98–110)
Creat: 1.06 mg/dL (ref 0.60–1.29)
Globulin: 2.8 g/dL (calc) (ref 1.9–3.7)
Glucose, Bld: 106 mg/dL — ABNORMAL HIGH (ref 65–99)
Potassium: 4.3 mmol/L (ref 3.5–5.3)
Sodium: 138 mmol/L (ref 135–146)
Total Bilirubin: 0.6 mg/dL (ref 0.2–1.2)
Total Protein: 6.8 g/dL (ref 6.1–8.1)

## 2020-09-12 LAB — RPR: RPR Ser Ql: NONREACTIVE

## 2020-09-12 LAB — T-HELPER CELLS (CD4) COUNT (NOT AT ARMC)
Absolute CD4: 831 cells/uL (ref 490–1740)
CD4 T Helper %: 32 % (ref 30–61)
Total lymphocyte count: 2571 cells/uL (ref 850–3900)

## 2020-09-12 LAB — HIV-1 RNA QUANT-NO REFLEX-BLD
HIV 1 RNA Quant: NOT DETECTED Copies/mL
HIV-1 RNA Quant, Log: NOT DETECTED Log cps/mL

## 2020-09-19 ENCOUNTER — Encounter: Payer: Self-pay | Admitting: Family

## 2020-09-21 ENCOUNTER — Encounter: Payer: Self-pay | Admitting: Family

## 2020-09-21 ENCOUNTER — Ambulatory Visit (INDEPENDENT_AMBULATORY_CARE_PROVIDER_SITE_OTHER): Payer: Self-pay | Admitting: Family

## 2020-09-21 ENCOUNTER — Other Ambulatory Visit: Payer: Self-pay

## 2020-09-21 DIAGNOSIS — B20 Human immunodeficiency virus [HIV] disease: Secondary | ICD-10-CM

## 2020-09-21 DIAGNOSIS — Z0000 Encounter for general adult medical examination without abnormal findings: Secondary | ICD-10-CM

## 2020-09-21 MED ORDER — BIKTARVY 50-200-25 MG PO TABS: 1.0000 | ORAL_TABLET | Freq: Every day | ORAL | 5 refills | Status: DC

## 2020-09-21 NOTE — Patient Instructions (Addendum)
Nice to see you.  Continue to take your medication daily as prescribed.   Refills have been sent to the pharmacy.  Let's work on keeping you undetectable.  Plan for follow up in 3 months or sooner if needed with lab work 1-2 weeks prior to appointment.

## 2020-09-21 NOTE — Assessment & Plan Note (Signed)
   Discussed importance of safe sexual practice to reduce risk of STI.  Due for routine dental care.

## 2020-09-21 NOTE — Assessment & Plan Note (Signed)
Rodney Chase continues to have well-controlled HIV disease with good adherence and tolerance to his ART regimen of Biktarvy.  No signs/symptoms of opportunistic infection or progressive HIV.  We reviewed previous lab work and discussed plan of care.  He is interested in long acting antiretrovirals which based on his current history taking medications he is not an ideal candidate at this time.  Continue current dose of Biktarvy.  Plan for follow-up in 3 months or sooner if needed with lab work 1 to 2 weeks prior to appointment.

## 2020-09-21 NOTE — Progress Notes (Signed)
Subjective:    Patient ID: Rodney Chase, male    DOB: 12-29-1975, 45 y.o.   MRN: [id number]  Chief Complaint  Patient presents with   HIV Positive/AIDS     Virtual Visit via Telephone/Video Note   I connected with Rodney Chase on 09/21/2020 at 9:10am by telephone and verified that I am speaking with the correct person using two identifiers.   I discussed the limitations, risks, security and privacy concerns of performing an evaluation and management service by telephone and the availability of in person appointments. I also discussed with the patient that there may be a patient responsible charge related to this service. The patient expressed understanding and agreed to proceed.  Location:  Patient: Home Provider: Clinic   HPI:  Rodney Chase is a 45 y.o. male with HIV disease last seen on 05/04/2020 with well-controlled virus and good adherence and tolerance to his ART regimen of Biktarvy.  Viral load at the time was undetectable with CD4 count of 560.  Most recent lab work completed on 09/10/2020 with viral load that remains undetectable and CD4 count of 831.  Telehealth visit today for routine follow-up.  Rodney Chase continues to take his Biktarvy daily as prescribed with no adverse side effects or missed doses since his last office visit.  Overall feeling well today with some body aches which she attributes to potential recent diagnosis of COVID. Denies fevers, chills, night sweats, headaches, changes in vision, neck pain/stiffness, nausea, diarrhea, vomiting, lesions or rashes.  Rodney Chase has no problems obtaining medication from the pharmacy and remains covered through [employer] which he renewed on 09/19/2020.  Denies feelings of being down, depressed, or hopeless recently. Continues to smoke marijuana daily with no alcohol consumption and about 1/4 packs of cigarettes per day.    Allergies  Allergen Reactions   Penicillins     Has patient had a PCN reaction causing immediate rash,  facial/tongue/throat swelling, SOB or lightheadedness with hypotension: YES Has patient had a PCN reaction causing severe rash involving mucus membranes or skin necrosis: NO Has patient had a PCN reaction that required hospitalization: UNK Has patient had a PCN reaction occurring within the last 10 years: NO If all of the above answers are "NO", then may proceed with Cephalosporin use.      Outpatient Medications Prior to Visit  Medication Sig Dispense Refill   bictegravir-emtricitabine-tenofovir AF (BIKTARVY) 50-200-25 MG TABS tablet Take 1 tablet by mouth daily. 30 tablet 5   clindamycin (CLEOCIN) 150 MG capsule Take 1 capsule (150 mg total) by mouth 3 (three) times daily. (Patient not taking: Reported on 09/21/2020) 21 capsule 0   Ensure (ENSURE) Take 1 Can by mouth daily. (Patient not taking: Reported on 09/21/2020) 7110 mL 5   levofloxacin (LEVAQUIN) 750 MG tablet Take 1 tablet (750 mg total) by mouth daily. X 7 days (Patient not taking: Reported on 09/21/2020) 7 tablet 0   No facility-administered medications prior to visit.     Past Medical History:  Diagnosis Date   HIV (human immunodeficiency virus infection) (HCC)    Tobacco use      History reviewed. No pertinent surgical history.   Review of Systems  Constitutional:  Negative for appetite change, chills, fatigue, fever and unexpected weight change.  Eyes:  Negative for visual disturbance.  Respiratory:  Negative for cough, chest tightness, shortness of breath and wheezing.   Cardiovascular:  Negative for chest pain and leg swelling.  Gastrointestinal:  Negative for abdominal pain, constipation, diarrhea, nausea  and vomiting.  Genitourinary:  Negative for dysuria, flank pain, frequency, genital sores, hematuria and urgency.  Skin:  Negative for rash.  Allergic/Immunologic: Negative for immunocompromised state.  Neurological:  Negative for dizziness and headaches.     Objective:    Nursing note and vital signs  reviewed.    Rodney Chase is pleasant to speak with and sounds to be doing well and in no apparent distress. Physical exam limited secondary to telehealth visit.   Assessment & Plan:   Problem List Items Addressed This Visit       Other   Human immunodeficiency virus (HIV) disease (HCC)    Rodney Chase continues to have well-controlled HIV disease with good adherence and tolerance to his ART regimen of Biktarvy.  No signs/symptoms of opportunistic infection or progressive HIV.  We reviewed previous lab work and discussed plan of care.  He is interested in long acting antiretrovirals which based on his current history taking medications he is not an ideal candidate at this time.  Continue current dose of Biktarvy.  Plan for follow-up in 3 months or sooner if needed with lab work 1 to 2 weeks prior to appointment.       Relevant Medications   bictegravir-emtricitabine-tenofovir AF (BIKTARVY) 50-200-25 MG TABS tablet   Healthcare maintenance    Discussed importance of safe sexual practice to reduce risk of STI. Due for routine dental care.         I have discontinued Rodney Chase's Ensure, levofloxacin, and clindamycin. I am also having him maintain his Biktarvy.   Meds ordered this encounter  Medications   bictegravir-emtricitabine-tenofovir AF (BIKTARVY) 50-200-25 MG TABS tablet    Sig: Take 1 tablet by mouth daily.    Dispense:  30 tablet    Refill:  5    Order Specific Question:   Supervising Provider    Answer:   Judyann Munson [phone]     I discussed the assessment and treatment plan with the patient. The patient was provided an opportunity to ask questions and all were answered. The patient agreed with the plan and demonstrated an understanding of the instructions.   The patient was advised to call back or seek an in-person evaluation if the symptoms worsen or if the condition fails to improve as anticipated.   I provided  15  minutes of non-face-to-face time during this  encounter.  Follow-up: Return in about 3 months (around 12/22/2020), or if symptoms worsen or fail to improve.   Rodney Eke, MSN, FNP-C Nurse Practitioner [facility] Main number: [phone]

## 2020-10-01 ENCOUNTER — Telehealth: Payer: Self-pay | Admitting: *Deleted

## 2020-10-01 DIAGNOSIS — Z0000 Encounter for general adult medical examination without abnormal findings: Secondary | ICD-10-CM

## 2020-10-01 NOTE — Telephone Encounter (Signed)
Received request to refill patient's ensure. This was discontinued by provider at last visit. Will route to provider to see if appropriate to restart. Andree Coss, RN

## 2020-10-01 NOTE — Telephone Encounter (Signed)
Patient called, because he forgot to let Tammy Sours know he is still having issues with the cut he got on his hand in May. He has tingling in his hand and up his arm. He did not follow up with hand surgery in May as directed. He has not seen a primary care doctor in several years. RN gave him Internal Medicine's number to see if he can establish primary care with them, be seen/evaluated for his current issue. Andree Coss, RN

## 2020-10-09 ENCOUNTER — Telehealth: Payer: Self-pay | Admitting: *Deleted

## 2020-10-09 ENCOUNTER — Encounter: Payer: Self-pay | Admitting: Student

## 2020-10-09 NOTE — Telephone Encounter (Signed)
Spoke with patient regarding his 2:30 p / missed appointment. Patient was instructed to call [phone] to reschedule this appointment.

## 2020-10-19 NOTE — Telephone Encounter (Signed)
Patient called regarding new referral needed to [facility].  Patient missed initial appointment and referral was closed at that time. Sending new referral and Internal Medicine will contact patient.   Patient verbalized understanding and will also call them at [phone] to follow up. Valarie Cones

## 2020-10-19 NOTE — Addendum Note (Signed)
Addended by: Valarie Cones on: 10/19/2020 10:15 AM   Modules accepted: Orders

## 2020-11-21 ENCOUNTER — Other Ambulatory Visit (HOSPITAL_COMMUNITY): Payer: Self-pay

## 2020-11-21 ENCOUNTER — Ambulatory Visit (INDEPENDENT_AMBULATORY_CARE_PROVIDER_SITE_OTHER): Payer: Self-pay | Admitting: Internal Medicine

## 2020-11-21 VITALS — BP 117/76 | HR 63 | Temp 98.4°F | Ht 63.0 in | Wt 122.5 lb

## 2020-11-21 DIAGNOSIS — B20 Human immunodeficiency virus [HIV] disease: Secondary | ICD-10-CM

## 2020-11-21 DIAGNOSIS — F419 Anxiety disorder, unspecified: Secondary | ICD-10-CM

## 2020-11-21 DIAGNOSIS — Z7689 Persons encountering health services in other specified circumstances: Secondary | ICD-10-CM

## 2020-11-21 DIAGNOSIS — G5620 Lesion of ulnar nerve, unspecified upper limb: Secondary | ICD-10-CM

## 2020-11-21 DIAGNOSIS — Z0000 Encounter for general adult medical examination without abnormal findings: Secondary | ICD-10-CM

## 2020-11-21 DIAGNOSIS — F411 Generalized anxiety disorder: Secondary | ICD-10-CM

## 2020-11-21 MED ORDER — DULOXETINE HCL 30 MG PO CPEP
ORAL_CAPSULE | ORAL | 0 refills | Status: DC
  Filled 2020-11-21 – 2021-01-14 (×3): qty 53, 30d supply, fill #0

## 2020-11-21 MED ORDER — DULOXETINE HCL 60 MG PO CPEP
60.0000 mg | ORAL_CAPSULE | Freq: Every day | ORAL | 3 refills | Status: DC
  Filled 2020-11-21: qty 30, 30d supply, fill #0

## 2020-11-21 NOTE — Patient Instructions (Signed)
[name], it was a pleasure seeing you today!  Today we discussed:  Establishment of care: Your PCP will be Dr. Rosita Fire.   Mood: I have placed a referral to our clinic counselor, Fredia Sorrow. We will also start you on duloxetine. Take 1 pill daily for the first week, then increase to 2 pills daily after that.   Follow-up: Follow up in 4 weeks  Please make sure to arrive 15 minutes prior to your next appointment. If you arrive late, you may be asked to reschedule.   We look forward to seeing you next time. Please call our clinic at [phone] if you have any questions or concerns. The best time to call is Monday-Friday from 9am-4pm, but there is someone available 24/7. If after hours or the weekend, call the main hospital number and ask for the Internal Medicine Resident On-Call. If you need medication refills, please notify your pharmacy one week in advance and they will send [place] a request.  Thank you for letting us take part in your care. Wishing you the best!

## 2020-11-22 ENCOUNTER — Encounter: Payer: Self-pay | Admitting: Internal Medicine

## 2020-11-22 DIAGNOSIS — F411 Generalized anxiety disorder: Secondary | ICD-10-CM | POA: Insufficient documentation

## 2020-11-22 DIAGNOSIS — G5620 Lesion of ulnar nerve, unspecified upper limb: Secondary | ICD-10-CM | POA: Insufficient documentation

## 2020-11-22 NOTE — Assessment & Plan Note (Signed)
HPI: he reports a long standing history of generalized anxiety which acutely worsened with the loss of his father last year. He describes feelings of restlessness and has experienced 3 anxiety attacks over the last year, with the most recent being yesterday.  He denies prior pharmacotherapy for treatment of this is in the past.  He denies symptoms of mania. Assessment: GAD complicated by panic disorder, prolonged grief disorder Plan  Referral to clinic counselor placed  Start duloxetine 30mg  x1w then increase to 60mg  daily--no interaction with biktarvy based on up to date interaction checker.   Follow up in 4-6w. Can consider titrating up to 120mg .

## 2020-11-22 NOTE — Assessment & Plan Note (Signed)
Well controlled on Biktarvy. He will continue to follow with ID.

## 2020-11-22 NOTE — Progress Notes (Signed)
New Patient Office Visit  Subjective:  Patient ID: Rodney Chase, Rodney Chase    DOB: Nov 19, 1975  Age: 45 y.o. MRN: [id number]  CC:  Chief Complaint  Patient presents with   New Patient (Initial Visit)    HPI Rodney Chase presents for establishment of care. Please refer to problem based charting for assessment and plan.   Past Medical History:  Diagnosis Date   HIV (human immunodeficiency virus infection) (HCC)    Tobacco use     No past surgical history on file.  Family History  Problem Relation Age of Onset   Diabetes Daughter    Breast cancer Mother        breast   Cancer Maternal Grandmother    Diabetes Paternal Grandmother    Lung cancer Maternal Aunt     Social History   Socioeconomic History   Marital status: Single    Spouse name: Not on file   Number of children: Not on file   Years of education: Not on file   Highest education level: Not on file  Occupational History   Not on file  Tobacco Use   Smoking status: Every Day    Packs/day: 0.20    Types: Cigarettes   Smokeless tobacco: Never  Vaping Use   Vaping Use: Never used  Substance and Sexual Activity   Alcohol use: No    Alcohol/week: 0.0 standard drinks   Drug use: Yes    Types: Marijuana    Comment: daily    Sexual activity: Not Currently    Partners: Rodney Chase  Other Topics Concern   Not on file  Social History Narrative   Lives with roommate   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  HENT: Negative.    Eyes: Negative.   Respiratory: Negative.     Cardiovascular: Negative.   Gastrointestinal: Negative.   Musculoskeletal: Negative.   Skin: Negative.   Neurological:  Positive for numbness. Negative for headaches.  Psychiatric/Behavioral:  Negative for suicidal ideas. The patient is nervous/anxious.    Objective:   Today's Vitals: BP 117/76 (BP Location: Right Arm, Patient Position: Sitting, Cuff Size: Small)   Pulse 63   Temp 98.4 F (36.9 C) (Oral)   Ht 5\' 3"  (1.6 m)   Wt 122 lb 8 oz (55.6 kg)   SpO2 100%   BMI 21.70 kg/m   Physical Exam Constitutional:      Appearance: Normal appearance.  HENT:     Mouth/Throat:     Mouth: Mucous membranes are moist.     Pharynx: Oropharynx is clear.  Eyes:     Conjunctiva/sclera: Conjunctivae normal.  Cardiovascular:     Rate and Rhythm: Normal rate and regular rhythm.  Pulmonary:     Effort: Pulmonary effort is normal.     Breath sounds: Normal breath sounds.  Abdominal:     General: Abdomen is flat.     Palpations: Abdomen is soft.  Musculoskeletal:        General: Normal range of motion.  Skin:  General: Skin is warm and dry.  Neurological:     Mental Status: He is alert.     Comments: Sensation to the bilateral hands are intact.   Psychiatric:        Mood and Affect: Mood normal.        Thought Content: Thought content normal.    Assessment & Plan:   Problem List Items Addressed This Visit       Nervous and Auditory   Ulnar nerve entrapment    HPI: He describes intermittent bilateral numbness/tingling in his 4th and 5th digits that is worse at night.  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 He will follow up with his PCP for ongoing monitoring.       Relevant Medications   DULoxetine (CYMBALTA) 30 MG capsule   DULoxetine (CYMBALTA) 60 MG capsule (Start on 12/21/2020)     Other   Human immunodeficiency virus (HIV) disease (HCC)    Well controlled on Biktarvy. He will continue to  follow with ID.       Healthcare maintenance    He presents for establishment of care today. He is currently uninsured so he was given the orange card application at the office today.       GAD (generalized anxiety disorder)    HPI: he reports a long standing history of generalized anxiety which acutely worsened with the loss of his father last year. He describes feelings of restlessness and has experienced 3 anxiety attacks over the last year, with the most recent being yesterday.  He denies prior pharmacotherapy for treatment of this is in the past.  He denies symptoms of mania. Assessment: GAD complicated by panic disorder, prolonged grief disorder Plan Referral to clinic counselor placed Start duloxetine 30mg  x1w then increase to 60mg  daily--no interaction with biktarvy based on up to date interaction checker.  Follow up in 4-6w. Can consider titrating up to 120mg .        Relevant Medications   DULoxetine (CYMBALTA) 30 MG capsule   DULoxetine (CYMBALTA) 60 MG capsule (Start on 12/21/2020)   Other Relevant Orders   Ambulatory referral to Integrated Behavioral Health   Other Visit Diagnoses     Encounter to establish care    -  Primary       Outpatient Encounter Medications as of 11/21/2020  Medication Sig   DULoxetine (CYMBALTA) 30 MG capsule Take 1 capsule by mouth daily for one week, then increase to 2 capsules daily thereafter.   [START ON 12/21/2020] DULoxetine (CYMBALTA) 60 MG capsule Take 1 capsule (60 mg total) by mouth daily.   bictegravir-emtricitabine-tenofovir AF (BIKTARVY) 50-200-25 MG TABS tablet Take 1 tablet by mouth daily.   No facility-administered encounter medications on file as of 11/21/2020.    Follow-up: Return in about 4 weeks (around 12/19/2020) for follow up of mood.   12/23/2020, MD Internal Medicine Resident PGY-3 11/23/2020 Internal Medicine Residency 11/22/2020 6:11 AM

## 2020-11-22 NOTE — Assessment & Plan Note (Signed)
HPI: He describes intermittent bilateral numbness/tingling in his 4th and 5th digits that is worse at night.  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 He will follow up with his PCP for ongoing monitoring.

## 2020-11-22 NOTE — Assessment & Plan Note (Signed)
He presents for establishment of care today. He is currently uninsured so he was given the orange card application at the office today.

## 2020-11-27 NOTE — Progress Notes (Signed)
Internal Medicine Clinic Attending  Case discussed with Dr. Christian  At the time of the visit.  We reviewed the resident's history and exam and pertinent patient test results.  I agree with the assessment, diagnosis, and plan of care documented in the resident's note.  

## 2020-11-29 ENCOUNTER — Other Ambulatory Visit (HOSPITAL_COMMUNITY): Payer: Self-pay

## 2020-12-20 ENCOUNTER — Encounter: Payer: Self-pay | Admitting: Internal Medicine

## 2020-12-20 ENCOUNTER — Institutional Professional Consult (permissible substitution): Payer: Self-pay | Admitting: Behavioral Health

## 2020-12-24 ENCOUNTER — Encounter: Payer: Self-pay | Admitting: Internal Medicine

## 2020-12-26 ENCOUNTER — Other Ambulatory Visit: Payer: Self-pay | Admitting: Family

## 2020-12-26 DIAGNOSIS — B20 Human immunodeficiency virus [HIV] disease: Secondary | ICD-10-CM

## 2021-01-03 ENCOUNTER — Ambulatory Visit: Payer: Self-pay | Admitting: Family

## 2021-01-09 ENCOUNTER — Ambulatory Visit: Payer: Self-pay | Admitting: Behavioral Health

## 2021-01-09 ENCOUNTER — Institutional Professional Consult (permissible substitution): Payer: Self-pay | Admitting: Behavioral Health

## 2021-01-09 DIAGNOSIS — F331 Major depressive disorder, recurrent, moderate: Secondary | ICD-10-CM

## 2021-01-09 DIAGNOSIS — F411 Generalized anxiety disorder: Secondary | ICD-10-CM

## 2021-01-09 DIAGNOSIS — F4321 Adjustment disorder with depressed mood: Secondary | ICD-10-CM

## 2021-01-09 NOTE — BH Specialist Note (Signed)
Integrated Behavioral Health Initial In-Person Visit  MRN: [id number] Name: Rodney Chase  Number of Integrated Behavioral Health Clinician visits:: 1/6 Session Start time: 9:30am  Session End time: 10:00am Total time: 30 minutes  Types of Service: Health Promotion and Introduction only  Interpretor:No. Interpretor Name and Language: n/a    Warm Hand Off Completed.        Subjective: Rodney Chase is a 45 y.o. male accompanied by  self Patient was referred by Dr. Acquanetta Chain, MD for anx/dep & tendency toward OCD behs. Patient reports the following symptoms/concerns: Pt is barely sleeping, his anx is high & his OCD beh is constant. "I always need to be doing smthg-always caring for others. I need to put myself first." Duration of problem: over a year; Severity of problem: moderate w/anx level & OCD tendencies both elevated.  Objective: Mood: Euphoric and Affect: Appropriate Risk of harm to self or others: No plan to harm self or others  Life Context: Family and Social: Pt is managing his HIV well. Pt's Fr died in 06-Dec-2022 of this year & he has hired a PI to investigate what happened to his Dad. 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 He has cared for him since 14. This morning he has just dropped this gentleman @ his Paramedic. The Family for this man has recently appeared & questioned Pt about his Caregiving. 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  Self-Care: Pt mentioned @ beginning of call that he needs to start putting himself first instead of everyone else. He has recently exp'd chest tightness & shared this w/his Renato Gails. This is the second episode of discomfort.  Discussed & reviewed Pt's medication for anx/dep: Duloxetine 30mg . Pt reports he has never picked this up from the Pharmacy. Reviewed w/Pt his PCP's prescription is meant to assist w/his Sx of anx/dep. Instructed Pt that Triage RN will  call Pt to review administration & ensure his prescription is re-sent to the Pharmacy.  Life Changes: Pt has recently lost his Parent. His Fr died on his Mother's birthday; sept December 05, 2020. Pt has not adequately addressed his grief. Pt is, "barely sleeping, yet I keep going & that is not good."  Patient and/or Family's Strengths/Protective Factors: Social connections, Social and Emotional competence, Concrete supports in place (healthy food, safe environments, etc.), and Sense of purpose  Goals Addressed: Patient will: Reduce symptoms of: anxiety, depression, stress, and tendencies towards repetitive beh in several aspects of daily living that reflect OCD profile Increase knowledge and/or ability of: coping skills, healthy habits, self-management skills, and stress reduction  Demonstrate ability to: Increase healthy adjustment to current life circumstances, Increase motivation to adhere to plan of care, Improve medication compliance, and Begin healthy grieving over loss  Progress towards Goals: Estb'd today; Pt will meet w/Clinician through the Holidays to address behavioral compulsions , anx/dep & grief  Interventions: Interventions utilized:  Introduction, ICD completion, & plan for future psychotherapy sessions via telehealth services   Standardized Assessments completed:  screeners upon next visit; Pt was not in place of privacy until 9:30am for 60 min session  Patient and/or Family Response: Pt receptive to call, but "forgot". Pt very friendly & requests 9:30 call back so he can have privacy. Pt agrees to plan for psychotherapy to ascertain needs & provide mental health wellness support.  Patient Centered Plan: Patient is on the following Treatment Plan(s):  Attend sessions  on IBH telehealth through the Holidays to improve Sx, inc self-care practices & reduce anx/dep & address grief over death of Fr that is causally misunderstood by Pt.   Assessment: Patient currently experiencing grief  avoidance over Fr's death, anx/dep driving OCD tendencies, & Pt who presents highly emot'ly dysregulated..   Patient may benefit from reduction of Sx due to his anx for which he is exp'g Px consequences incl'g chest tightness.  Plan: Follow up with behavioral health clinician on : 2-3 wks on telehealth-which he can change to f:f @ his discretion. Behavioral recommendations: Read the materials Clinician is mailing & see what is helpful. Take notes & we will discuss next session. Referral(s): Integrated Behavioral Health Services (In Clinic); eventual Referral to [facility] for Tx of OCD. "From scale of 1-10, how likely are you to follow plan?": 5  Deneise Lever, LMFT

## 2021-01-10 ENCOUNTER — Telehealth: Payer: Self-pay | Admitting: *Deleted

## 2021-01-10 ENCOUNTER — Other Ambulatory Visit (HOSPITAL_COMMUNITY): Payer: Self-pay

## 2021-01-10 NOTE — Telephone Encounter (Signed)
Received message from [facility] requesting assistance with Duloxetine. Patient did not p/u Rx for 30 mg written 9/21. He was to take this for one week then take 2 caps (60 mg total) thereafter. A second Rx was written on 10/21 for the 60 mg cap to take once daily.  Spoke with Darl Pikes at [facility]. She will get the 30 mg caps ready and instruct patient how to take it.  Also, placed call to patient to review med instructions. He was on a call with MCD, he will call back when that conversation is finished.

## 2021-01-11 NOTE — Telephone Encounter (Signed)
Returned call to patient. He was able to correctly state to take 1 cap (30 mg total) daily for 1 week, then 2 caps daily till bottle finished. He will then p/u 60 mg caps and take 1 cap daily thereafter. Explained that Dr. Monna Fam is advising he start this medication as soon as possible. He understands that this med is on the $4 list, and [facility] closes at 6 PM today.

## 2021-01-14 ENCOUNTER — Ambulatory Visit (INDEPENDENT_AMBULATORY_CARE_PROVIDER_SITE_OTHER): Payer: Self-pay | Admitting: Internal Medicine

## 2021-01-14 ENCOUNTER — Other Ambulatory Visit (HOSPITAL_COMMUNITY): Payer: Self-pay

## 2021-01-14 ENCOUNTER — Other Ambulatory Visit (HOSPITAL_BASED_OUTPATIENT_CLINIC_OR_DEPARTMENT_OTHER): Payer: Self-pay

## 2021-01-14 DIAGNOSIS — F411 Generalized anxiety disorder: Secondary | ICD-10-CM

## 2021-01-14 DIAGNOSIS — G5622 Lesion of ulnar nerve, left upper limb: Secondary | ICD-10-CM

## 2021-01-14 DIAGNOSIS — B20 Human immunodeficiency virus [HIV] disease: Secondary | ICD-10-CM

## 2021-01-14 DIAGNOSIS — Z0000 Encounter for general adult medical examination without abnormal findings: Secondary | ICD-10-CM

## 2021-01-14 NOTE — Assessment & Plan Note (Addendum)
Flu vaccine offered, pt denied Pnuemococcal vaccine offered, pt states he will think about getting it during f/u visit.   Pt asks if he needs lung cancer screening since he has a hx of cigarette smoking; educated on lung cancer screening recommendations, and counseled on the importance of quitting smoking to reduce risks of lung carcinoma.

## 2021-01-14 NOTE — Patient Instructions (Addendum)
Thank you, [name] for allowing [place] to provide your care today!  Today we discussed:  Neurpathic pain- please start taking cymbalta 30 mg for 1 week and then start taking 60 mg everyday without missing any doses. This will take several weeks before you notice a difference. This will also help with anxiety.  Lung cancer- you do not need a CT scan at this time  Liver function from last visit with ID showed good liver function.   I will call you if any labs, tests, or imaging results require any further attention or action.   I have ordered the following labs for you:  Lab Orders  No laboratory test(s) ordered today     Tests ordered today:  None  Referrals ordered today:   Referral Orders  No referral(s) requested today     I have ordered the following medication/changed the following medications:   Stop the following medications: There are no discontinued medications.   Start the following medications: No orders of the defined types were placed in this encounter.    Follow up in: 2 months    Should you have any questions or concerns please call the internal medicine clinic at [phone].     Carmel Sacramento, MD  Internal Medicine Resident, PGY-1 [facility]

## 2021-01-14 NOTE — Progress Notes (Signed)
   CC: ulnar nerve entrapment follow up, question about lung cancer screening and liver fxn   HPI:  Rodney Chase is a 45 y.o. male with a PMHx stated below and presents today for stated above. Please see the Encounters tab for problem-based Assessment & Plan for additional details.   Past Medical History:  Diagnosis Date   HIV (human immunodeficiency virus infection) (HCC)    Tobacco use     Current Outpatient Medications on File Prior to Visit  Medication Sig Dispense Refill   bictegravir-emtricitabine-tenofovir AF (BIKTARVY) 50-200-25 MG TABS tablet Take 1 tablet by mouth daily. 30 tablet 5   DULoxetine (CYMBALTA) 30 MG capsule Take 1 capsule by mouth daily for one week, then increase to 2 capsules daily thereafter. 53 capsule 0   DULoxetine (CYMBALTA) 60 MG capsule Take 1 capsule (60 mg total) by mouth daily. 30 capsule 3   No current facility-administered medications on file prior to visit.    Family History  Problem Relation Age of Onset   Diabetes Daughter    Breast cancer Mother        breast   Cancer Maternal Grandmother    Diabetes Paternal Grandmother    Lung cancer Maternal Aunt     Social History   Socioeconomic History   Marital status: Single    Spouse name: Not on file   Number of children: Not on file   Years of education: Not on file   Highest education level: Not on file  Occupational History   Not on file  Tobacco Use   Smoking status: Every Day    Packs/day: 0.20    Types: Cigarettes   Smokeless tobacco: Never  Vaping Use   Vaping Use: Never used  Substance and Sexual Activity   Alcohol use: No    Alcohol/week: 0.0 standard drinks   Drug use: Yes    Types: Marijuana    Comment: daily    Sexual activity: Not Currently    Partners: Male  Other Topics Concern   Not on file  Social History Narrative   Lives with roommate   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  Vitals:   01/14/21 1341  BP: 114/81  Pulse: 77  Temp: 97.9 F (36.6 C)  TempSrc: Oral  SpO2: 100%  Weight: 123 lb 9.6 oz (56.1 kg)     Physical Exam: Constitutional: alert, well-appearing, in NAD HENT: normocephalic, atraumatic, mucous membranes moist Eyes: conjunctiva non-erythematous, EOMI; no jaundice present.  Cardiovascular: RRR, no m/r/g, no peripheral edema Pulmonary/Chest: normal work of breathing on RA, LCTAB Neurological: A&O x 3, 5/5 strength in bilateral upper and lower extremities. Positive for possible numbness in left 4th and 5th digit, unchanged from prior visit 11/21/20.  Psych: normal behavior, normal affect    Assessment & Plan:   See Encounters Tab for problem based charting.  Patient seen with Dr. Rosemarie Ax, MD  Internal Medicine Resident, PGY-1 [facility]

## 2021-01-14 NOTE — Assessment & Plan Note (Addendum)
Continues to endorse intermittent numbness/tingling in his left 4th and 5th digits that is worse at night. Has associated neuropathic pain in left arm up to axilla. Has associated weakness with arm that is unchanged from prior visit. Stable exam, including numbness, compared to prior visit. He states improved symptoms in right hand. Advised pt to avoid flexing his arm as much as possible, to sleep with arm extended, and avoid putting excess pressure on L arm. Continued to encourage rest. He has not picked up Cymbalta prescribed during previous visit. Talked about using a brace.   - Continue conservative management: thermotherapy, brace, positioning  - Start Cymbalta 30 mg for 1 week followed by Cymbalta 60 mg qd.  - Will consider referral to neurology (nerve conduction study) if symptoms not improving, sxs worsen, or QOL impacted.

## 2021-01-14 NOTE — Assessment & Plan Note (Signed)
Well controlled on Biktarvy. No reported adverse effects. He will continue to follow with ID.

## 2021-01-14 NOTE — Assessment & Plan Note (Addendum)
Continues to experience anxiety. Trouble with sleep onset. Has not picked up duloxetine x 2 months. States he will pick it up after today's visit. Explained to pt that it is difficult to manage/titrate his medications without appropriate compliance; he expresses understanding. He is concerned he may have a "liver problem" because he believes he has jaundice of the eyes. Exam benign with no jaundice present, and CMP x several years with normal/stable liver fxn. Anxiety appears to be playing a role in this illness anxiety. Explained to him that his liver fxn is stable, and exam wnl; pt expresses understanding.   Start Cymbalta 30 mg for 1 week and then 60 mg qd.  Follow up with Dr Monna Fam on 12/7

## 2021-01-17 ENCOUNTER — Other Ambulatory Visit (HOSPITAL_BASED_OUTPATIENT_CLINIC_OR_DEPARTMENT_OTHER): Payer: Self-pay

## 2021-01-18 NOTE — Progress Notes (Signed)
Internal Medicine Clinic Attending  I saw and evaluated the patient.  I personally confirmed the key portions of the history and exam documented by Dr. Patel and I reviewed pertinent patient test results.  The assessment, diagnosis, and plan were formulated together and I agree with the documentation in the resident's note.  

## 2021-01-28 ENCOUNTER — Ambulatory Visit: Payer: Self-pay | Admitting: Family

## 2021-02-06 ENCOUNTER — Ambulatory Visit: Payer: Self-pay | Admitting: Behavioral Health

## 2021-02-06 DIAGNOSIS — F411 Generalized anxiety disorder: Secondary | ICD-10-CM

## 2021-02-06 DIAGNOSIS — F331 Major depressive disorder, recurrent, moderate: Secondary | ICD-10-CM

## 2021-02-06 NOTE — BH Specialist Note (Signed)
Integrated Behavioral Health via Telemedicine Visit  02/06/2021 Rodney Chase [id number]  Number of Integrated Behavioral Health visits: 2/6 Session Start time: 1:00pm  Session End time: 1:30pm Total time: 30  Referring Provider: Dr. 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 [facility] Provider location: [facility] Office All persons participating in visit: Pt & Clinician Types of Service: Individual psychotherapy  I connected with Rodney Chase and/or [employer]  self  via  Telephone or Engineer, civil (consulting)  (Video is Surveyor, mining) and verified that I am speaking with the correct person using two identifiers. Discussed confidentiality:  2nd visit  I discussed the limitations of telemedicine and the availability of in person appointments.  Discussed there is a possibility of technology failure and discussed alternative modes of communication if that failure occurs.  I discussed that engaging in this telemedicine visit, they consent to the provision of behavioral healthcare and the services will be billed under their insurance.  Patient and/or legal guardian expressed understanding and consented to Telemedicine visit:  2nd visit  Presenting Concerns: Patient and/or family reports the following symptoms/concerns: anx/dep due to the [facility] Family he cares for 24/7 disagreeing w/him over this man's care. Pt has been w/this man since 1996. He is his POA & Family is making it extremely stressful to deal w/them & provide for his male companion. Duration of problem: this year Family has acutely inc'd their insistence on knowing more about their Bros. This has caused tremendous friction for Pt in his earnest efforts to care for his companion; Severity of problem: moderate trending severe  Patient and/or Family's Strengths/Protective Factors: Concrete supports in place (healthy food, safe environments, etc.) and Sense  of purpose  Goals Addressed: Patient will:  Reduce symptoms of: anxiety, depression, and tendencies toward obsessive beh    Increase knowledge and/or ability of: coping skills, healthy habits, and stress reduction   Demonstrate ability to: Increase healthy adjustment to current life circumstances  Progress towards Goals: Ongoing  Interventions: Interventions utilized:  Solution-Focused Strategies, Mindfulness or Relaxation Training, and Supportive Counseling Standardized Assessments completed:  screeners prn  Patient and/or Family Response: Pt receptive to call today, seeming hurried & anxious in presentation verbally. Pt reassured by promise to speak again.  Assessment: Patient currently experiencing elevated anx levels that have worsened due to Companion's Family being demanding. Pt is trying to care for himself, but Caregiving is also demanding 24/7. Companion's Family has not offered to help in any way, just making demands & disagreeing w/Pt's methods of care.  Patient may benefit from cont'd support for his Caregiving & self-care efforts.  Plan: Follow up with behavioral health clinician on : 2-3 wks on telehealth for 30 min ck-in Behavioral recommendations: Inc self-care practices as much as possible. Referral(s): Integrated [employer] (In Clinic) and Caregiver Support [facility]  I discussed the assessment and treatment plan with the patient and/or parent/guardian. They were provided an opportunity to ask questions and all were answered. They agreed with the plan and demonstrated an understanding of the instructions.   They were advised to call back or seek an in-person evaluation if the symptoms worsen or if the condition fails to improve as anticipated.  Deneise Lever, LMFT

## 2021-02-19 ENCOUNTER — Telehealth: Payer: Self-pay | Admitting: Internal Medicine

## 2021-02-19 DIAGNOSIS — G5622 Lesion of ulnar nerve, left upper limb: Secondary | ICD-10-CM

## 2021-02-19 NOTE — Telephone Encounter (Signed)
Pt called requesting a Referral to a Hand Surgeon.  Patient states he was seen on 01/14/2021 and his hand is no better.  Patient states her was to have another Referral to Neurology for a Nerve Conductions Study, but would also like a Referral to hand Surgery if possible.  Please advise if the patient needs to be seen again or if the Referral can be placed.

## 2021-03-05 ENCOUNTER — Ambulatory Visit: Payer: Self-pay | Admitting: Family

## 2021-03-06 ENCOUNTER — Telehealth: Payer: Self-pay | Admitting: Behavioral Health

## 2021-03-06 ENCOUNTER — Ambulatory Visit: Payer: Self-pay | Admitting: Behavioral Health

## 2021-03-06 NOTE — Telephone Encounter (Signed)
Unable to lv msg for Pt today about telehealth session-phone sts, "mailbox is full".  Dr. Monna Fam

## 2021-03-15 ENCOUNTER — Other Ambulatory Visit: Payer: Self-pay

## 2021-03-15 ENCOUNTER — Ambulatory Visit (INDEPENDENT_AMBULATORY_CARE_PROVIDER_SITE_OTHER): Payer: Self-pay | Admitting: Pharmacist

## 2021-03-15 ENCOUNTER — Encounter: Payer: Self-pay | Admitting: Family

## 2021-03-15 DIAGNOSIS — B20 Human immunodeficiency virus [HIV] disease: Secondary | ICD-10-CM

## 2021-03-15 MED ORDER — BIKTARVY 50-200-25 MG PO TABS: 1.0000 | ORAL_TABLET | Freq: Every day | ORAL | 2 refills | Status: DC

## 2021-03-15 NOTE — Progress Notes (Signed)
03/15/2021  HPI: Rodney Chase is a 46 y.o. male who presents to the [place] clinic for HIV follow-up.  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   DULOXETINE (CYMBALTA) 30 MG CAPSULE    Take 1 capsule by mouth daily for one week, then increase to 2 capsules daily thereafter.   DULOXETINE (CYMBALTA) 60 MG CAPSULE    Take 1 capsule (60 mg total) by mouth daily.  Modified Medications   No medications on file  Discontinued Medications   No medications on file    Allergies: Allergies  Allergen Reactions   Penicillins     Has patient had a PCN reaction causing immediate rash, facial/tongue/throat swelling, SOB or lightheadedness with hypotension: YES Has patient had a PCN reaction causing severe rash involving mucus membranes or skin necrosis: NO Has patient had a PCN reaction that required hospitalization: UNK Has patient had a PCN reaction occurring within the last 10 years: NO If all of the above answers are "NO", then may proceed with Cephalosporin use.    Past Medical History: Past Medical History:  Diagnosis Date   HIV (human immunodeficiency virus infection) ([place])    Tobacco use     Social History: Social History   Socioeconomic History   Marital status: Single    Spouse name: Not on file   Number of children: Not on file   Years of education: Not on file   Highest education level: Not on file  Occupational History   Not on file  Tobacco Use   Smoking status: Every Day    Packs/day: 0.20    Types: Cigarettes   Smokeless tobacco: Never  Vaping Use   Vaping Use:  Never used  Substance and Sexual Activity   Alcohol use: No    Alcohol/week: 0.0 standard drinks   Drug use: Yes    Types: Marijuana    Comment: daily    Sexual activity: Not Currently    Partners: Male  Other Topics Concern   Not on file  Social History Narrative   Lives with roommate   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   Social Determinants of Health   Financial Resource Strain: Not on file  Food Insecurity: Not on file  Transportation Needs: Not on file  Physical Activity: Not on file  Stress: Not on file  Social Connections: Not on file    Labs: Lab Results  Component Value Date   HIV1RNAQUANT Not Detected 09/10/2020   HIV1RNAQUANT <20 (H) 04/26/2020   HIV1RNAQUANT 66,800 (H) 01/03/2020   CD4TABS 560 04/26/2020   CD4TABS 478 01/03/2020   CD4TABS 641 10/11/2019    RPR and STI Lab Results  Component Value Date   LABRPR NON-REACTIVE 09/10/2020   LABRPR NON-REACTIVE 01/03/2020   LABRPR NON-REACTIVE 10/11/2019   LABRPR NON-REACTIVE 03/23/2019   LABRPR NON-REACTIVE 01/05/2018    STI Results GC CT  03/23/2019 Negative Negative  06/26/2017 Negative Negative  01/31/2016 Negative Negative  04/05/2015 Negative Negative  06/16/2013 NG: Negative CT: Negative  05/26/2013 NG: Negative CT: Negative  Hepatitis B Lab Results  Component Value Date   HEPBSAB NONREACTIVE 09/06/2012   HEPBSAG No 04/27/2006   Hepatitis C No results found for: HEPCAB, HCVRNAPCRQN Hepatitis A Lab Results  Component Value Date   HAV NEG 09/06/2012   Lipids: Lab Results  Component Value Date   CHOL 149 09/10/2020   TRIG 67 09/10/2020   HDL 52 09/10/2020   CHOLHDL 2.9 09/10/2020   VLDL 15 12/28/2015   LDLCALC 83 09/10/2020    Current HIV Regimen: Biktarvy daily  Assessment: Rodney Chase presents to clinic today for a routine HIV follow-up. He is doing well and reports no side effects or complaints. He reports  fairly good adherence stating that he never forgets to take his medication, but he did miss a few days last month while waiting to pick it up from the pharmacy. Patient was offered STI screenings and urine, oral, and rectal cytologies were collected. We also ordered HIV RNA, CD4, and RPR testing this visit. Rodney Chase was offered the Covid vaccine and declined. A 10-month refill of Biktarvy was sent to his pharmacy.   Plan: -Follow-up STI screenings (cytologies and RPR) and HIV labs (RNA and CD4) -Biktarvy refill prescribed -83-month follow-up scheduled with Marya Amsler for 06/17/2021  Donald Pore, PharmD Pharmacy Resident 03/15/2021, 11:20 AM

## 2021-03-18 LAB — CYTOLOGY, (ORAL, ANAL, URETHRAL) ANCILLARY ONLY
Chlamydia: NEGATIVE
Chlamydia: NEGATIVE
Comment: NEGATIVE
Comment: NEGATIVE
Comment: NORMAL
Comment: NORMAL
Neisseria Gonorrhea: NEGATIVE
Neisseria Gonorrhea: NEGATIVE

## 2021-03-18 LAB — T-HELPER CELLS (CD4) COUNT (NOT AT ARMC)
Absolute CD4: 814 cells/uL (ref 490–1740)
CD4 T Helper %: 36 % (ref 30–61)
Total lymphocyte count: 2283 cells/uL (ref 850–3900)

## 2021-03-18 LAB — URINE CYTOLOGY ANCILLARY ONLY
Chlamydia: NEGATIVE
Comment: NEGATIVE
Comment: NORMAL
Neisseria Gonorrhea: NEGATIVE

## 2021-03-18 LAB — HIV-1 RNA QUANT-NO REFLEX-BLD
HIV 1 RNA Quant: NOT DETECTED Copies/mL
HIV-1 RNA Quant, Log: NOT DETECTED Log cps/mL

## 2021-03-18 LAB — RPR: RPR Ser Ql: NONREACTIVE

## 2021-04-23 ENCOUNTER — Telehealth: Payer: Self-pay

## 2021-04-23 NOTE — Telephone Encounter (Signed)
Patient called to reschedule appointment with Tammy Sours and have a nurse call back to discuss last two labs visit results, contact number [phone]

## 2021-04-23 NOTE — Telephone Encounter (Signed)
Spoke with patient, relayed that CD4 count is excellent at over 800 and viral load is undetectable. STI testing negative.   He would like to know his iron results as he says he often feels cold. Advised this is not something we routinely test for. He has an appointment with internal medicine tomorrow, recommended he bring this up at his appointment so that his PCP can work this up. Patient verbalized understanding and has no further questions.   Sandie Ano, RN

## 2021-04-24 ENCOUNTER — Encounter: Payer: Self-pay | Admitting: Internal Medicine

## 2021-04-29 ENCOUNTER — Telehealth: Payer: Self-pay

## 2021-04-29 ENCOUNTER — Ambulatory Visit: Payer: Self-pay | Admitting: Family

## 2021-04-29 NOTE — Telephone Encounter (Signed)
Called patient to reschedule missed appointment this morning. No answer. Left HIPAA compliant voicemail requesting call back.  Note: Per note on 03/15/21, pharmacy recommended next follow up on 06/17/21.  Binnie Kand, RN

## 2021-05-02 ENCOUNTER — Encounter: Payer: Self-pay | Admitting: Student

## 2021-05-14 ENCOUNTER — Telehealth: Payer: Self-pay

## 2021-05-14 NOTE — Telephone Encounter (Signed)
Called pt regarding migraines and eye doctor referral. I notified pt that he does not need a referral for an eye doctor and to contact his insurance provider to find one in network. I advised pt to call his PCP to address his migraines. Pt verbalized his understanding. ?Philippa Chester, CMA ? ?

## 2021-05-14 NOTE — Telephone Encounter (Signed)
-----   Message from [place] sent at 05/13/2021 12:11 PM EDT ----- ?Patient called in regards to getting an appointment scheduled, patient also said he has been having migraines for the past few days and is concerned and was wondering if he could get referred to an eye doctor.. Thank you  ? ?

## 2021-05-14 NOTE — Telephone Encounter (Signed)
Attempted to call pt regarding message about migraines and referral to eye doctor. Vmb full ?Philippa Chester, CMA ?  ?

## 2021-05-23 ENCOUNTER — Other Ambulatory Visit: Payer: Self-pay

## 2021-05-23 ENCOUNTER — Ambulatory Visit: Payer: Self-pay | Admitting: Family

## 2021-05-23 ENCOUNTER — Encounter: Payer: Self-pay | Admitting: Family

## 2021-05-23 ENCOUNTER — Other Ambulatory Visit (HOSPITAL_COMMUNITY)
Admission: RE | Admit: 2021-05-23 | Discharge: 2021-05-23 | Disposition: A | Discharge status: Home / Self Care | Payer: Medicaid Other | Source: Ambulatory Visit | Attending: Family | Admitting: Family

## 2021-05-23 ENCOUNTER — Ambulatory Visit (INDEPENDENT_AMBULATORY_CARE_PROVIDER_SITE_OTHER): Payer: Self-pay | Admitting: Family

## 2021-05-23 VITALS — BP 117/79 | HR 96 | Temp 98.0°F | Wt 123.0 lb

## 2021-05-23 DIAGNOSIS — Z23 Encounter for immunization: Secondary | ICD-10-CM

## 2021-05-23 DIAGNOSIS — Z0000 Encounter for general adult medical examination without abnormal findings: Secondary | ICD-10-CM

## 2021-05-23 DIAGNOSIS — Z113 Encounter for screening for infections with a predominantly sexual mode of transmission: Secondary | ICD-10-CM | POA: Diagnosis present

## 2021-05-23 DIAGNOSIS — Z79899 Other long term (current) drug therapy: Secondary | ICD-10-CM

## 2021-05-23 DIAGNOSIS — B20 Human immunodeficiency virus [HIV] disease: Secondary | ICD-10-CM

## 2021-05-23 MED ORDER — BIKTARVY 50-200-25 MG PO TABS: 1.0000 | ORAL_TABLET | Freq: Every day | ORAL | 5 refills | Status: DC

## 2021-05-23 NOTE — Progress Notes (Signed)
? ? ?Brief Narrative  ? ?Patient ID: Rodney Chase, male    DOB: [birth year], 46 y.o.   MRN: [id number] ? ?[name] is a 46 y/o male diagnosed with HIV disease in August 2007 with risk factor of MSM. Initial viral load and CD4 count are unavailable. Genotypes with no medication resistant mutations. No history of opportunistic infection. [id number] negative. Previous ART experience with Complera and Triumeq.  ? ?Subjective:  ?  ?Chief Complaint  ?Patient presents with  ? Follow-up  ? ? ?HPI: ? ?Rodney Chase is a 46 y.o. male with HIV disease last seen on 09/21/20 with well controlled virus and good adherence and tolerance to his ART regiment of Biktarvy. Viral load was undetectable and CD4 count of 831. Here today for routine follow up.  ? ?[name] has been taking his medication daily as prescribed with occasional missed dose. Overall feeling well today with occasional hip pain. Denies fevers, chills, night sweats, headaches, changes in vision, neck pain/stiffness, nausea, diarrhea, vomiting, lesions or rashes. ? ?[name] has no problems obtaining medication from the pharmacy and has no problems obtaining medication from the pharmacy. Denies feelings of being down, depressed or hopeless recently. Smoke marijuana and tobacco daily with no alcohol consumption. Condoms offered. Healthcare maintenance due include pneumococcal vaccination.  ? ? ?Allergies  ?Allergen Reactions  ? Penicillins   ?  Has patient had a PCN reaction causing immediate rash, facial/tongue/throat swelling, SOB or lightheadedness with hypotension: YES ?Has patient had a PCN reaction causing severe rash involving mucus membranes or skin necrosis: NO ?Has patient had a PCN reaction that required hospitalization: UNK ?Has patient had a PCN reaction occurring within the last 10 years: NO ?If all of the above answers are "NO", then may proceed with Cephalosporin use.  ? ? ? ? ?Outpatient Medications Prior to Visit  ?Medication Sig Dispense Refill  ?  bictegravir-emtricitabine-tenofovir AF (BIKTARVY) 50-200-25 MG TABS tablet Take 1 tablet by mouth daily. 30 tablet 2  ? DULoxetine (CYMBALTA) 30 MG capsule Take 1 capsule by mouth daily for one week, then increase to 2 capsules daily thereafter. (Patient not taking: Reported on 05/23/2021) 53 capsule 0  ? DULoxetine (CYMBALTA) 60 MG capsule Take 1 capsule (60 mg total) by mouth daily. (Patient not taking: Reported on 05/23/2021) 30 capsule 3  ? ?No facility-administered medications prior to visit.  ? ? ? ?Past Medical History:  ?Diagnosis Date  ? HIV (human immunodeficiency virus infection) (HCC)   ? Tobacco use   ? ? ? ?History reviewed. No pertinent surgical history. ? ? ? ?Review of Systems  ?Constitutional:  Negative for appetite change, chills, fatigue, fever and unexpected weight change.  ?Eyes:  Negative for visual disturbance.  ?Respiratory:  Negative for cough, chest tightness, shortness of breath and wheezing.   ?Cardiovascular:  Negative for chest pain and leg swelling.  ?Gastrointestinal:  Negative for abdominal pain, constipation, diarrhea, nausea and vomiting.  ?Genitourinary:  Negative for dysuria, flank pain, frequency, genital sores, hematuria and urgency.  ?Skin:  Negative for rash.  ?Allergic/Immunologic: Negative for immunocompromised state.  ?Neurological:  Negative for dizziness and headaches.  ?   ?Objective:  ?  ?BP 117/79   Pulse 96   Temp 98 ?F (36.7 ?C) (Temporal)   Wt 123 lb (55.8 kg)   BMI 21.79 kg/m?  ?Nursing note and vital signs reviewed. ? ?Physical Exam ?Constitutional:   ?   General: He is not in acute distress. ?   Appearance: He is well-developed.  ?  Eyes:  ?   Conjunctiva/sclera: Conjunctivae normal.  ?Cardiovascular:  ?   Rate and Rhythm: Normal rate and regular rhythm.  ?   Heart sounds: Normal heart sounds. No murmur heard. ?  No friction rub. No gallop.  ?Pulmonary:  ?   Effort: Pulmonary effort is normal. No respiratory distress.  ?   Breath sounds: Normal breath sounds.  No wheezing or rales.  ?Chest:  ?   Chest wall: No tenderness.  ?Abdominal:  ?   General: Bowel sounds are normal.  ?   Palpations: Abdomen is soft.  ?   Tenderness: There is no abdominal tenderness.  ?Musculoskeletal:  ?   Cervical back: Neck supple.  ?Lymphadenopathy:  ?   Cervical: No cervical adenopathy.  ?Skin: ?   General: Skin is warm and dry.  ?   Findings: No rash.  ?Neurological:  ?   Mental Status: He is alert and oriented to person, place, and time.  ?Psychiatric:     ?   Behavior: Behavior normal.     ?   Thought Content: Thought content normal.     ?   Judgment: Judgment normal.  ? ? ? ? ?  01/14/2021  ?  2:06 PM 11/21/2020  ? 10:36 AM 05/04/2020  ?  9:13 AM 02/28/2020  ?  9:05 AM 01/03/2020  ?  9:43 AM  ?Depression screen PHQ 2/9  ?Decreased Interest 3 0 0 3 3  ?Down, Depressed, Hopeless 1 0 0 1 1  ?PHQ - 2 Score 4 0 0 4 4  ?Altered sleeping 3 3  3 3   ?Tired, decreased energy 3 1  3  0  ?Change in appetite 1 2  3 1   ?Feeling bad or failure about yourself  3 0  1 0  ?Trouble concentrating 1 2  3  0  ?Moving slowly or fidgety/restless 3 3  3 1   ?Suicidal thoughts 0 0  0 0  ?PHQ-9 Score 18 11  20 9   ?Difficult doing work/chores Somewhat difficult Not difficult at all  Extremely dIfficult Extremely dIfficult  ?  ?   ?Assessment & Plan:  ? ? ?Patient Active Problem List  ? Diagnosis Date Noted  ? GAD (generalized anxiety disorder) 11/22/2020  ? 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 Healthcare maintenance 06/21/2019  ? Human immunodeficiency virus (HIV) disease (HCC) 03/27/2006  ? ? ? ?Problem List Items Addressed This Visit   ? ?  ? Other  ? Human immunodeficiency virus (HIV) disease (HCC) - Primary  ?  Mr. Rodney Chase has well-controlled virus with good adherence and tolerance to his ART regimen of Biktarvy.  No signs/symptoms of opportunistic infection.  We reviewed previous lab work and discussed plan of care.  Continue current dose of Biktarvy.  Check blood work today.  He will need to renew financial assistance  prior to next office visit.  Plan for follow-up in 6 months or sooner if needed with lab work on the same day. ?  ?  ? Relevant Medications  ? bictegravir-emtricitabine-tenofovir AF (BIKTARVY) 50-200-25 MG TABS tablet  ? Other Relevant Orders  ? Comprehensive metabolic panel  ? HIV-1 RNA quant-no reflex-bld  ? T-helper cell (CD4)- ([facility] clinic only)  ? Healthcare maintenance  ?  Discussed importance of safe sexual practices and condom use.  Condoms offered. ?Prevnar 20 updated. ?STD testing per request ?  ?  ? Relevant Orders  ? Pneumococcal conjugate vaccine 20-valent (Completed)  ? ?Other Visit Diagnoses   ? ? Screening for STDs (  sexually transmitted diseases)      ? Relevant Orders  ? RPR  ? Cytology (oral, anal, urethral) ancillary only  ? Cytology (oral, anal, urethral) ancillary only  ? Urine cytology ancillary only  ? Pharmacologic therapy      ? Relevant Orders  ? Lipid panel  ? Need for pneumococcal vaccination      ? Relevant Orders  ? Pneumococcal conjugate vaccine 20-valent (Completed)  ? ?  ? ? ? ?I have discontinued Kyheem Turberville's DULoxetine and DULoxetine. I am also having him maintain his Biktarvy. ? ? ?Meds ordered this encounter  ?Medications  ? bictegravir-emtricitabine-tenofovir AF (BIKTARVY) 50-200-25 MG TABS tablet  ?  Sig: Take 1 tablet by mouth daily.  ?  Dispense:  30 tablet  ?  Refill:  5  ?  Order Specific Question:   Supervising Provider  ?  Answer:   Judyann Munson [[id number]]  ? ? ? ?Follow-up: Return in about 4 months (around 09/22/2021), or if symptoms worsen or fail to improve. ? ? ?Marcos Eke, MSN, FNP-C ?Nurse Practitioner ?[facility] ?[facility] ?[facility] Main number: [phone] ? ? ?

## 2021-05-23 NOTE — Assessment & Plan Note (Signed)
Rodney Chase has well-controlled virus with good adherence and tolerance to his ART regimen of Biktarvy.  No signs/symptoms of opportunistic infection.  We reviewed previous lab work and discussed plan of care.  Continue current dose of Biktarvy.  Check blood work today.  He will need to renew financial assistance prior to next office visit.  Plan for follow-up in 6 months or sooner if needed with lab work on the same day. ?

## 2021-05-23 NOTE — Assessment & Plan Note (Addendum)
?   Discussed importance of safe sexual practices and condom use.  Condoms offered. ?? Prevnar 20 updated. ?? STD testing per request ?

## 2021-05-23 NOTE — Patient Instructions (Addendum)
Nice to see you. ? ?We will check your lab work today. ? ?Continue to take your medication daily as prescribed. ? ?Refills have been sent to the pharmacy. ? ?Plan for follow up in 6 months or sooner if needed with lab work on the same day. ? ?Have a great day and stay safe! ? ?

## 2021-05-24 LAB — CYTOLOGY, (ORAL, ANAL, URETHRAL) ANCILLARY ONLY
Chlamydia: NEGATIVE
Chlamydia: NEGATIVE
Comment: NEGATIVE
Comment: NEGATIVE
Comment: NORMAL
Comment: NORMAL
Neisseria Gonorrhea: NEGATIVE
Neisseria Gonorrhea: NEGATIVE

## 2021-05-24 LAB — T-HELPER CELL (CD4) - (RCID CLINIC ONLY)
CD4 % Helper T Cell: 36 % (ref 33–65)
CD4 T Cell Abs: 823 /uL (ref 400–1790)

## 2021-05-24 LAB — URINE CYTOLOGY ANCILLARY ONLY
Chlamydia: NEGATIVE
Comment: NEGATIVE
Comment: NORMAL
Neisseria Gonorrhea: NEGATIVE

## 2021-05-27 LAB — COMPREHENSIVE METABOLIC PANEL
AG Ratio: 1.7 (calc) (ref 1.0–2.5)
ALT: 7 U/L — ABNORMAL LOW (ref 9–46)
AST: 13 U/L (ref 10–40)
Albumin: 4.4 g/dL (ref 3.6–5.1)
Alkaline phosphatase (APISO): 51 U/L (ref 36–130)
BUN: 15 mg/dL (ref 7–25)
CO2: 31 mmol/L (ref 20–32)
Calcium: 9.3 mg/dL (ref 8.6–10.3)
Chloride: 105 mmol/L (ref 98–110)
Creat: 1.01 mg/dL (ref 0.60–1.29)
Globulin: 2.6 g/dL (calc) (ref 1.9–3.7)
Glucose, Bld: 86 mg/dL (ref 65–99)
Potassium: 4.1 mmol/L (ref 3.5–5.3)
Sodium: 140 mmol/L (ref 135–146)
Total Bilirubin: 0.5 mg/dL (ref 0.2–1.2)
Total Protein: 7 g/dL (ref 6.1–8.1)

## 2021-05-27 LAB — LIPID PANEL
Cholesterol: 164 mg/dL (ref <200)
HDL: 54 mg/dL (ref >=40)
LDL Cholesterol (Calc): 95 mg/dL (calc)
Non-HDL Cholesterol (Calc): 110 mg/dL (calc) (ref <130)
Total CHOL/HDL Ratio: 3 (calc) (ref <5.0)
Triglycerides: 61 mg/dL (ref <150)

## 2021-05-27 LAB — HIV-1 RNA QUANT-NO REFLEX-BLD
HIV 1 RNA Quant: NOT DETECTED Copies/mL
HIV-1 RNA Quant, Log: NOT DETECTED Log cps/mL

## 2021-05-27 LAB — RPR: RPR Ser Ql: NONREACTIVE

## 2021-06-17 ENCOUNTER — Ambulatory Visit: Payer: Self-pay | Admitting: Family

## 2021-09-16 ENCOUNTER — Ambulatory Visit: Payer: Self-pay | Admitting: Family

## 2021-09-17 ENCOUNTER — Ambulatory Visit: Payer: Self-pay

## 2021-09-17 ENCOUNTER — Ambulatory Visit (INDEPENDENT_AMBULATORY_CARE_PROVIDER_SITE_OTHER): Payer: Self-pay | Admitting: Family

## 2021-09-17 ENCOUNTER — Encounter: Payer: Self-pay | Admitting: Family

## 2021-09-17 ENCOUNTER — Other Ambulatory Visit: Payer: Self-pay

## 2021-09-17 VITALS — BP 105/74 | HR 86 | Temp 98.2°F | Ht 64.0 in | Wt 120.0 lb

## 2021-09-17 DIAGNOSIS — Z113 Encounter for screening for infections with a predominantly sexual mode of transmission: Secondary | ICD-10-CM

## 2021-09-17 DIAGNOSIS — Z0000 Encounter for general adult medical examination without abnormal findings: Secondary | ICD-10-CM

## 2021-09-17 DIAGNOSIS — Z5181 Encounter for therapeutic drug level monitoring: Secondary | ICD-10-CM

## 2021-09-17 DIAGNOSIS — B20 Human immunodeficiency virus [HIV] disease: Secondary | ICD-10-CM

## 2021-09-17 MED ORDER — BIKTARVY 50-200-25 MG PO TABS
1.0000 | ORAL_TABLET | Freq: Every day | ORAL | 5 refills | Status: DC
  Filled 2022-06-13 – 2022-06-24 (×2): qty 30, 30d supply, fill #0

## 2021-09-17 NOTE — Assessment & Plan Note (Signed)
Mr. Demario continues to have well-controlled virus with good adherence and tolerance to Biktarvy.  We reviewed previous lab work and discussed plan of care.  Check blood work today.  Renew financial assistance.  Continue current dose of Biktarvy.  Plan for follow-up in 6 months or sooner if needed with lab work on the same day.

## 2021-09-17 NOTE — Assessment & Plan Note (Signed)
   Discussed importance of safe sexual practices and condom use.  Condoms offered.  Routine dental care scheduled.  Menveo due next office visit.

## 2021-09-17 NOTE — Progress Notes (Signed)
Brief Narrative   Patient ID: Rodney Chase, male    DOB: 06-26-1975, 46 y.o.   MRN: [id number]  Rodney Chase is a 46 y/o male diagnosed with HIV disease in August 2007 with risk factor of MSM. Initial viral load and CD4 count are unavailable. Genotypes with no medication resistant mutations. No history of opportunistic infection. [id number] negative. Previous ART experience with Complera and Triumeq.   Subjective:    Chief Complaint  Patient presents with   Follow-up    HPI:  Rodney Chase is a 46 y.o. male with HIV disease last seen on 05/23/2021 with well-controlled virus and good adherence and tolerance to [employer].  Viral load was undetectable with CD4 count of 823.  STD testing was negative for gonorrhea, chlamydia, and syphilis.  Kidney function, liver function, electrolytes within normal ranges.  Here today for routine follow-up.  Rodney Chase continues to take his Biktarvy daily as prescribed with no adverse side effects.  He did miss about a 4-days of medication because he misplaced them.  Overall feeling well today with no new concerns/complaints. Denies fevers, chills, night sweats, headaches, changes in vision, neck pain/stiffness, nausea, diarrhea, vomiting, lesions or rashes.  Rodney Chase has no problems obtaining medication from the pharmacy and needs to renew financial assistance.  Denies feelings of being down, depressed, or hopeless recently.  Drinks alcohol on occasion with daily marijuana use and every day tobacco use.  Condoms offered.  Routine dental care scheduled.  Healthcare maintenance due includes Menveo.   Allergies  Allergen Reactions   Penicillins     Has patient had a PCN reaction causing immediate rash, facial/tongue/throat swelling, SOB or lightheadedness with hypotension: YES Has patient had a PCN reaction causing severe rash involving mucus membranes or skin necrosis: NO Has patient had a PCN reaction that required hospitalization: UNK Has patient had a  PCN reaction occurring within the last 10 years: NO If all of the above answers are "NO", then may proceed with Cephalosporin use.      Outpatient Medications Prior to Visit  Medication Sig Dispense Refill   bictegravir-emtricitabine-tenofovir AF (BIKTARVY) 50-200-25 MG TABS tablet Take 1 tablet by mouth daily. 30 tablet 5   No facility-administered medications prior to visit.     Past Medical History:  Diagnosis Date   HIV (human immunodeficiency virus infection) (HCC)    Tobacco use      History reviewed. No pertinent surgical history.    Review of Systems  Constitutional:  Negative for appetite change, chills, fatigue, fever and unexpected weight change.  Eyes:  Negative for visual disturbance.  Respiratory:  Negative for cough, chest tightness, shortness of breath and wheezing.   Cardiovascular:  Negative for chest pain and leg swelling.  Gastrointestinal:  Negative for abdominal pain, constipation, diarrhea, nausea and vomiting.  Genitourinary:  Negative for dysuria, flank pain, frequency, genital sores, hematuria and urgency.  Skin:  Negative for rash.  Allergic/Immunologic: Negative for immunocompromised state.  Neurological:  Negative for dizziness and headaches.      Objective:    BP 105/74   Pulse 86   Temp 98.2 F (36.8 C) (Oral)   Ht 5\' 4"  (1.626 m)   Wt 120 lb (54.4 kg)   SpO2 100%   BMI 20.60 kg/m  Nursing note and vital signs reviewed.  Physical Exam Constitutional:      General: He is not in acute distress.    Appearance: He is well-developed.  Eyes:     Conjunctiva/sclera: Conjunctivae normal.  Cardiovascular:     Rate and Rhythm: Normal rate and regular rhythm.     Heart sounds: Normal heart sounds. No murmur heard.    No friction rub. No gallop.  Pulmonary:     Effort: Pulmonary effort is normal. No respiratory distress.     Breath sounds: Normal breath sounds. No wheezing or rales.  Chest:     Chest wall: No tenderness.  Abdominal:      General: Bowel sounds are normal.     Palpations: Abdomen is soft.     Tenderness: There is no abdominal tenderness.  Musculoskeletal:     Cervical back: Neck supple.  Lymphadenopathy:     Cervical: No cervical adenopathy.  Skin:    General: Skin is warm and dry.     Findings: No rash.  Neurological:     Mental Status: He is alert and oriented to person, place, and time.  Psychiatric:        Behavior: Behavior normal.        Thought Content: Thought content normal.        Judgment: Judgment normal.         09/17/2021   10:18 AM 01/14/2021    2:06 PM 11/21/2020   10:36 AM 05/04/2020    9:13 AM 02/28/2020    9:05 AM  Depression screen PHQ 2/9  Decreased Interest 0 3 0 0 3  Down, Depressed, Hopeless 0 1 0 0 1  PHQ - 2 Score 0 4 0 0 4  Altered sleeping  $RemoveB'3 3  3  'nMrmSCtX$ Tired, decreased energy  $Remov'3 1  3  'jatrnO$ Change in appetite  $RemoveB'1 2  3  'qDmZKQLT$ Feeling bad or failure about yourself   3 0  1  Trouble concentrating  $RemoveBefore'1 2  3  'OSoVuLKgMiJBU$ Moving slowly or fidgety/restless  $RemoveBeforeDEI'3 3  3  'pigpXvfXftcgJtoo$ Suicidal thoughts  0 0  0  PHQ-9 Score  $Remo'18 11  20  'WhfVD$ Difficult doing work/chores  Somewhat difficult Not difficult at all  Extremely dIfficult       Assessment & Plan:    Patient Active Problem List   Diagnosis Date Noted   Therapeutic drug monitoring 09/17/2021   GAD (generalized anxiety disorder) 11/22/2020   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  We reviewed previous lab work and discussed plan of care.  Check blood work today.  Renew financial assistance.  Continue current dose of Biktarvy.  Plan for follow-up in 6 months or sooner if needed with lab work on the same day.      Relevant Medications    bictegravir-emtricitabine-tenofovir AF (BIKTARVY) 50-200-25 MG TABS tablet   Other Relevant Orders   Comprehensive metabolic panel   HIV-1 RNA quant-no reflex-bld   T-helper cell (CD4)- ([facility] clinic only)   Healthcare maintenance    Discussed importance of safe sexual practices and condom use.  Condoms offered. Routine dental care scheduled. Menveo due next office visit.      Therapeutic drug monitoring    Renal function stable with creatinine of 1.01 and eGFR of 94. Continue to monitor renal function while on Biktarvy.       Other Visit Diagnoses     Screening  for STDs (sexually transmitted diseases)       Relevant Orders   RPR        I am having Michelle Piper maintain his [employer].   Meds ordered this encounter  Medications   bictegravir-emtricitabine-tenofovir AF (BIKTARVY) 50-200-25 MG TABS tablet    Sig: Take 1 tablet by mouth daily.    Dispense:  30 tablet    Refill:  5    Order Specific Question:   Supervising Provider    Answer:   Carlyle Basques [[id number]]     Follow-up: Return in about 6 months (around 03/20/2022), or if symptoms worsen or fail to improve.   Terri Piedra, MSN, FNP-C Nurse Practitioner [facility] [place] number: [phone]

## 2021-09-17 NOTE — Assessment & Plan Note (Signed)
Renal function stable with creatinine of 1.01 and eGFR of 94. Continue to monitor renal function while on Biktarvy.

## 2021-09-17 NOTE — Patient Instructions (Addendum)
Nice to see you.  We will check your lab work today.  Continue to take your medication daily as prescribed.  Renew your financial assistance.   Refills have been sent to the pharmacy.  Plan for follow up in 6 months or sooner if needed with lab work on the same day.  Have a great day and stay safe!

## 2021-09-18 LAB — T-HELPER CELL (CD4) - (RCID CLINIC ONLY)
CD4 % Helper T Cell: 35 % (ref 33–65)
CD4 T Cell Abs: 733 /uL (ref 400–1790)

## 2021-09-20 LAB — COMPREHENSIVE METABOLIC PANEL
AG Ratio: 1.5 (calc) (ref 1.0–2.5)
ALT: 10 U/L (ref 9–46)
AST: 16 U/L (ref 10–40)
Albumin: 4.3 g/dL (ref 3.6–5.1)
Alkaline phosphatase (APISO): 54 U/L (ref 36–130)
BUN: 17 mg/dL (ref 7–25)
CO2: 31 mmol/L (ref 20–32)
Calcium: 9.3 mg/dL (ref 8.6–10.3)
Chloride: 104 mmol/L (ref 98–110)
Creat: 1.07 mg/dL (ref 0.60–1.29)
Globulin: 2.9 g/dL (calc) (ref 1.9–3.7)
Glucose, Bld: 81 mg/dL (ref 65–99)
Potassium: 4.4 mmol/L (ref 3.5–5.3)
Sodium: 139 mmol/L (ref 135–146)
Total Bilirubin: 0.5 mg/dL (ref 0.2–1.2)
Total Protein: 7.2 g/dL (ref 6.1–8.1)

## 2021-09-20 LAB — HIV-1 RNA QUANT-NO REFLEX-BLD
HIV 1 RNA Quant: NOT DETECTED Copies/mL
HIV-1 RNA Quant, Log: NOT DETECTED Log cps/mL

## 2021-09-20 LAB — RPR: RPR Ser Ql: NONREACTIVE

## 2021-09-22 ENCOUNTER — Other Ambulatory Visit: Payer: Self-pay | Admitting: Family

## 2021-09-22 DIAGNOSIS — B20 Human immunodeficiency virus [HIV] disease: Secondary | ICD-10-CM

## 2021-11-07 ENCOUNTER — Telehealth: Payer: Self-pay

## 2021-11-07 NOTE — Telephone Encounter (Signed)
Pt is requesting a call back .Marland Kitchen He stated that he fell into his recliner   a few nights ago and his is now having pain in his legs and hip .. I have given him the last appt with Dr  Welton Flakes  9/15

## 2021-11-07 NOTE — Telephone Encounter (Signed)
Returned call to patient. States he slept wrong in his new recliner and feels severe pain from gluteus radiating down leg. Also, notes pain in thigh when straining to have a BM. States it hurts to bear weight, or sit to drive. As there are no openings in [facility], he agrees to go to UC/ED for evaluation. He will keep appt on 9/15 as f/u.

## 2021-11-07 NOTE — Telephone Encounter (Signed)
Patient called, says he thinks he slept in his recliner wrong and is now having severe pain in his hip. He also reports constipation and straining when trying to pass a bowel movement. Provided him with phone number for his internal medicine PCP and advised him to reach out and see if they can see him for evaluation of these problems. Patient verbalized understanding and has no further questions.   Sandie Ano, RN

## 2021-11-15 ENCOUNTER — Encounter: Payer: Medicaid Other | Admitting: Internal Medicine

## 2021-11-15 NOTE — Progress Notes (Deleted)
   CC: Hip and leg pain  HPI:  [name] is a 46 y.o. with medical history of HIV and tobacco use disorder presenting to [facility] for hip and leg pain.   Please see problem-based list for further details, assessments, and plans.  Past Medical History:  Diagnosis Date   HIV (human immunodeficiency virus infection) (HCC)    Tobacco use          Current Outpatient Medications (Other):    bictegravir-emtricitabine-tenofovir AF (BIKTARVY) 50-200-25 MG TABS tablet, Take 1 tablet by mouth daily.  Review of Systems:  Review of system negative unless stated in the problem list or HPI.    Physical Exam:  There were no vitals filed for this visit.  Physical Exam General: NAD HENT: NCAT Lungs: CTAB, no wheeze, rhonchi or rales.  Cardiovascular: Normal heart sounds, no r/m/g, 2+ pulses in all extremities. No LE edema Abdomen: No TTP, normal bowel sounds MSK: No asymmetry or muscle atrophy.  Skin: no lesions noted on exposed skin Neuro: Alert and oriented x4. CN grossly intact Psych: Normal mood and normal affect   Assessment & Plan:   No problem-specific Assessment & Plan notes found for this encounter.   See Encounters Tab for problem based charting.  Patient discussed with Dr. {NAMES:[id number]::"Guilloud","Hoffman","Mullen","Narendra","Vincent","Machen","Lau","Hatcher"} Rodney Abbot, MD Eligha Bridegroom. [facility] Residency, PGY-2

## 2021-12-23 ENCOUNTER — Ambulatory Visit: Payer: Medicaid Other

## 2021-12-26 ENCOUNTER — Encounter: Payer: Medicaid Other | Admitting: Internal Medicine

## 2021-12-26 NOTE — Progress Notes (Deleted)
Hip pain  Constipation  Smoking  HIV followed with with [facility] 7/23. Well-controlled virus with good adherence to Biktarvy   CG: Flu, colonoscopy

## 2022-01-06 ENCOUNTER — Encounter: Payer: Medicaid Other | Admitting: Internal Medicine

## 2022-01-13 ENCOUNTER — Ambulatory Visit (INDEPENDENT_AMBULATORY_CARE_PROVIDER_SITE_OTHER): Payer: Self-pay

## 2022-01-13 VITALS — BP 139/99 | HR 88 | Temp 98.7°F | Wt 122.6 lb

## 2022-01-13 DIAGNOSIS — H5319 Other subjective visual disturbances: Secondary | ICD-10-CM

## 2022-01-13 DIAGNOSIS — Z1211 Encounter for screening for malignant neoplasm of colon: Secondary | ICD-10-CM

## 2022-01-13 DIAGNOSIS — H43399 Other vitreous opacities, unspecified eye: Secondary | ICD-10-CM | POA: Insufficient documentation

## 2022-01-13 DIAGNOSIS — R002 Palpitations: Secondary | ICD-10-CM

## 2022-01-13 NOTE — Progress Notes (Deleted)
Ulnar nerve entrapment - Continue conservative management: thermotherapy, brace, positioning  - Start Cymbalta 30 mg for 1 week followed by Cymbalta 60 mg qd.  - Will consider referral to neurology (nerve conduction study) if symptoms not improving, sxs worsen, or QOL impacted.  HIV Saw ID 7/18, plan for F/u 6 months Biktarvy  GAD  HCM Colonoscopy flu

## 2022-01-13 NOTE — Assessment & Plan Note (Signed)
Patient reports years of exertion which random episodes of heat overtaking his body, feeling of tachycardia and sensation of near syncope. He says he has been to the ED twice for this in the past and EKGs were negative. The last episode was around four months ago and they only happen every once in a while. This sounds like vasovagal presyncope but exertionally induced palpitations with pre-syncope also prompts consideration of arrhythmias. May need implanted loop recorder to catch events given length between episodes. -amb referral to cardiology.

## 2022-01-13 NOTE — Progress Notes (Signed)
Established Patient Office Visit  Subjective   Patient ID: Rodney Chase, male    DOB: June 05, 1975  Age: 46 y.o. MRN: [id number]  No chief complaint on file.   Rodney Chase is a 46 y/o male with a pmh outlined below. Please see encounter tab for HPI and A/P information.      Review of Systems  All other systems reviewed and are negative.     Objective:     BP (!) 139/99 (BP Location: Left Arm, Patient Position: Sitting, Cuff Size: Normal)   Pulse 88   Temp 98.7 F (37.1 C) (Oral)   Wt 122 lb 9.6 oz (55.6 kg)   SpO2 100%   BMI 21.04 kg/m    Physical Exam Constitutional:      General: He is not in acute distress.    Appearance: Normal appearance. He is normal weight.  Eyes:     General: No scleral icterus.       Right eye: No discharge.        Left eye: No discharge.     Extraocular Movements: Extraocular movements intact.     Conjunctiva/sclera: Conjunctivae normal.     Pupils: Pupils are equal, round, and reactive to light.  Cardiovascular:     Rate and Rhythm: Normal rate and regular rhythm.     Pulses: Normal pulses.     Heart sounds: Normal heart sounds. No murmur heard.    No friction rub. No gallop.  Pulmonary:     Effort: Pulmonary effort is normal. No respiratory distress.     Breath sounds: Normal breath sounds. No wheezing or rales.  Abdominal:     General: Abdomen is flat. Bowel sounds are normal. There is no distension.     Palpations: Abdomen is soft. There is no mass.     Tenderness: There is no abdominal tenderness. There is no guarding or rebound.     Hernia: A hernia is present.  Musculoskeletal:     Right lower leg: No edema.     Left lower leg: No edema.  Skin:    General: Skin is warm and dry.     Capillary Refill: Capillary refill takes less than 2 seconds.     Coloration: Skin is not jaundiced.  Neurological:     Mental Status: He is alert.      No results found for any visits on 01/13/22.    The 10-year ASCVD risk score  (Arnett DK, et al., 2019) is: 7.5%    Assessment & Plan:   Problem List Items Addressed This Visit       Other   Palpitations - Primary    Patient reports years of exertion which random episodes of heat overtaking his body, feeling of tachycardia and sensation of near syncope. He says he has been to the ED twice for this in the past and EKGs were negative. The last episode was around four months ago and they only happen every once in a while. This sounds like vasovagal presyncope but exertionally induced palpitations with pre-syncope also prompts consideration of arrhythmias. May need implanted loop recorder to catch events given length between episodes. -amb referral to cardiology.      Relevant Orders   Ambulatory referral to Cardiology   Floaters with photopsia    Patient reports 6 months of pain and sensitivity to bright lights, especially at night where he also sees halos around lights. He has also been getting floaters and reports subjective decreased visual acuity. He denies pain  in his eyes. On exam the eyes do not feel overly firm, PERRL, conjunctiva are non-erythematous. Given well controlled HIV do not suspect CMV retinitis. Other more common causes of photopsia include glaucoma or macular degeneration. He denies family history of either.Another consideration is astigmatism. -optho referral -should also get optometry eval for vision      Other Visit Diagnoses     Photopsia       Relevant Orders   Ambulatory referral to Ophthalmology   Colon cancer screening       Relevant Orders   Ambulatory referral to Gastroenterology       Return in about 6 months (around 07/14/2022).    Willette Cluster, MD

## 2022-01-13 NOTE — Assessment & Plan Note (Signed)
Patient reports 6 months of pain and sensitivity to bright lights, especially at night where he also sees halos around lights. He has also been getting floaters and reports subjective decreased visual acuity. He denies pain in his eyes. On exam the eyes do not feel overly firm, PERRL, conjunctiva are non-erythematous. Given well controlled HIV do not suspect CMV retinitis. Other more common causes of photopsia include glaucoma or macular degeneration. He denies family history of either.Another consideration is astigmatism. -optho referral -should also get optometry eval for vision

## 2022-01-13 NOTE — Patient Instructions (Signed)
Thank you, [name] for allowing [place] to provide your care today. Today we discussed :  Sensitivity to light/ floaters- We will refer you to the ophthalmologist to rule out things like macular degeneration or glaucoma. Please come back and see [place] if their evaluation is normal.  Sense of passing out- This is likely vasovagal presycope, a response of the body to a trigger that causes the heart to pump less blood and less blood to get to the brain. We will refer you to cardiology for referral to see if they want to monitor your heart rhythm and try to capture an event.  Colon cancer screening-referred to cardiology. Referrals ordered today:    Referral Orders         Ambulatory referral to Cardiology         Ambulatory referral to Ophthalmology         Ambulatory referral to Gastroenterology      I have ordered the following medication/changed the following medications:   Stop the following medications: There are no discontinued medications.   Start the following medications: No orders of the defined types were placed in this encounter.    Follow up: 6 months     We look forward to seeing you next time. Please call our clinic at [phone] if you have any questions or concerns. The best time to call is Monday-Friday from 9am-4pm, but there is someone available 24/7. If after hours or the weekend, call the main hospital number and ask for the Internal Medicine Resident On-Call. If you need medication refills, please notify your pharmacy one week in advance and they will send [place] a request.   Thank you for trusting me with your care. Wishing you the best!   Willette Cluster, MD [facility]

## 2022-01-20 NOTE — Addendum Note (Signed)
Addended by: Burnell Blanks on: 01/20/2022 09:55 AM   Modules accepted: Level of Service

## 2022-01-20 NOTE — Progress Notes (Signed)
Internal Medicine Clinic Attending  I saw and evaluated the patient.  I personally confirmed the key portions of the history and exam documented by Dr. Rogers and I reviewed pertinent patient test results.  The assessment, diagnosis, and plan were formulated together and I agree with the documentation in the resident's note.  

## 2022-02-17 ENCOUNTER — Ambulatory Visit: Payer: Medicaid Other

## 2022-02-19 ENCOUNTER — Other Ambulatory Visit: Payer: Self-pay

## 2022-02-19 ENCOUNTER — Other Ambulatory Visit: Payer: Medicaid Other

## 2022-02-19 DIAGNOSIS — Z111 Encounter for screening for respiratory tuberculosis: Secondary | ICD-10-CM

## 2022-02-19 DIAGNOSIS — Z113 Encounter for screening for infections with a predominantly sexual mode of transmission: Secondary | ICD-10-CM

## 2022-02-19 DIAGNOSIS — B20 Human immunodeficiency virus [HIV] disease: Secondary | ICD-10-CM

## 2022-02-21 LAB — QUANTIFERON-TB GOLD PLUS
Mitogen-NIL: >10 IU/mL
NIL: 0.04 IU/mL
QuantiFERON-TB Gold Plus: NEGATIVE
TB1-NIL: 0 IU/mL
TB2-NIL: 0 IU/mL

## 2022-03-07 NOTE — Progress Notes (Unsigned)
Cardiology Office Note:    Date:  03/07/2022   ID:  Rodney Chase, DOB 30-May-1975, MRN [id number]  PCP:  Rick Duff, MD   [place] Providers Cardiologist:  Lenna Sciara, MD Referring MD: Sid Falcon, MD   Chief Complaint/Reason for Referral: Palpitations  ASSESSMENT:    1. Palpitations   2. Human immunodeficiency virus (HIV) disease ([place])     PLAN:    In order of problems listed above: 1.  Palpitations: We will obtain reflex TSH, monitor, and echocardiogram to evaluate further. 2.  HIV: Continue current antiretrovirals.         {Are you ordering a CV Procedure (e.g. stress test, cath, DCCV, TEE, etc)?   Press F2        :[id number]}   Dispo:  No follow-ups on file.      Medication Adjustments/Labs and Tests Ordered: Current medicines are reviewed at length with the patient today.  Concerns regarding medicines are outlined above.  The following changes have been made:  {PLAN; NO CHANGE:13088:s}   Labs/tests ordered: No orders of the defined types were placed in this encounter.   Medication Changes: No orders of the defined types were placed in this encounter.    Current medicines are reviewed at length with the patient today.  The patient {ACTIONS; HAS/DOES NOT HAVE:19233} concerns regarding medicines.   History of Present Illness:    FOCUSED PROBLEM LIST:   1.  HIV on antiretroviral therapy 2.  Ongoing tobacco abuse  The patient is a 47 y.o. male with the indicated medical history here for recommendations regarding palpitations.  The patient is seen by his primary care provider recently and reported palpitations with occasional near syncope.          Current Medications: No outpatient medications have been marked as taking for the 03/12/22 encounter (Appointment) with Early Osmond, MD.     Allergies:    Penicillins   Social History:   Social History   Tobacco Use   Smoking status: Every Day    Packs/day: 0.30    Types:  Cigarettes   Smokeless tobacco: Never  Vaping Use   Vaping Use: Never used  Substance Use Topics   Alcohol use: Yes    Comment: occasionally   Drug use: Yes    Types: Marijuana    Comment: daily     Family Hx: Family History  Problem Relation Age of Onset   Diabetes Daughter    Breast cancer Mother        breast   Cancer Maternal Grandmother    Diabetes Paternal Grandmother    Lung cancer Maternal Aunt      Review of Systems:   Please see the history of present illness.    All other systems reviewed and are negative.     EKGs/Labs/Other Test Reviewed:    EKG:  EKG performed 2019 that I personally reviewed demonstrates sinus rhythm with J-point elevation and diffuse ST elevations; EKG performed today that I personally reviewed demonstrates ***.  Prior CV studies: None available  Other studies Reviewed: Review of the additional studies/records demonstrates: CT abdomen pelvis 2022 without aortic atherosclerosis  Recent Labs: 09/17/2021: ALT 10; BUN 17; Creat 1.07; Potassium 4.4; Sodium 139   Recent Lipid Panel Lab Results  Component Value Date/Time   CHOL 164 05/23/2021 02:48 AM   TRIG 61 05/23/2021 02:48 AM   HDL 54 05/23/2021 02:48 AM   LDLCALC 95 05/23/2021 02:48 AM    Risk Assessment/Calculations:    {  Does this patient have ATRIAL FIBRILLATION?:[phone]}      No BP recorded.  {Refresh Note OR Click here to enter BP  :1}***    Physical Exam:    VS:  There were no vitals taken for this visit.   Wt Readings from Last 3 Encounters:  01/13/22 122 lb 9.6 oz (55.6 kg)  09/17/21 120 lb (54.4 kg)  05/23/21 123 lb (55.8 kg)    GENERAL:  No apparent distress, AOx3 HEENT:  No carotid bruits, +2 carotid impulses, no scleral icterus CAR: RRR Irregular RR*** no murmurs***, gallops, rubs, or thrills RES:  Clear to auscultation bilaterally ABD:  Soft, nontender, nondistended, positive bowel sounds x 4 VASC:  +2 radial pulses, +2 carotid pulses, palpable pedal  pulses NEURO:  CN 2-12 grossly intact; motor and sensory grossly intact PSYCH:  No active depression or anxiety EXT:  No edema, ecchymosis, or cyanosis  Signed, Early Osmond, MD  03/07/2022 9:23 AM    [address], [place], [place]  [address] Phone: [phone]; Fax: [phone]   Note:  This document was prepared using Dragon voice recognition software and may include unintentional dictation errors.

## 2022-03-11 ENCOUNTER — Telehealth: Payer: Self-pay | Admitting: Internal Medicine

## 2022-03-11 NOTE — Telephone Encounter (Signed)
TB screening result  The result showed that you haven't been exposed to, or infected with tuberculosis  02/19/2022     Component Ref Range & Units 2 wk ago  QuantiFERON-TB Gold Plus NEGATIVE NEGATIVE  Comment: Negative test result. M. tuberculosis complex infection unlikely.  NIL IU/mL 0.04  Mitogen-NIL IU/mL >10.00  TB1-NIL IU/mL 0.00  TB2-NIL IU/mL 0.00  Comment: . The Nil tube value reflects the background interferon gamma immune response of the patient's blood sample. This value has been subtracted from the patient's displayed TB and Mitogen results. . Lower than expected results with the Mitogen tube prevent false-negative Quantiferon readings by detecting a patient with a potential immune suppressive condition and/or suboptimal pre-analytical specimen handling. . The TB1 Antigen tube is coated with the M. tuberculosis-specific antigens designed to elicit responses from TB antigen primed CD4+ helper T-lymphocytes. . The TB2 Antigen tube is coated with the M. tuberculosis-specific antigens designed to elicit responses from TB antigen primed CD4+ helper and CD8+ cytotoxic T-lymphocytes. . For additional information, please refer to https://education.questdiagnostics.com/faq/FAQ204 (This link is being provided for informational/ educational purposes only.)

## 2022-03-12 ENCOUNTER — Ambulatory Visit: Payer: Medicaid Other | Attending: Internal Medicine | Admitting: Internal Medicine

## 2022-03-12 DIAGNOSIS — R002 Palpitations: Secondary | ICD-10-CM

## 2022-03-12 DIAGNOSIS — B20 Human immunodeficiency virus [HIV] disease: Secondary | ICD-10-CM

## 2022-03-19 ENCOUNTER — Telehealth: Payer: Self-pay

## 2022-03-19 ENCOUNTER — Telehealth: Payer: Self-pay | Admitting: Family

## 2022-03-19 MED ORDER — MEGESTROL ACETATE 625 MG/5ML PO SUSP: 625.0000 mg | Freq: Every day | ORAL | 2 refills | Status: DC

## 2022-03-19 NOTE — Telephone Encounter (Signed)
Rodney Chase is concerned about his weight and wishing to gain weight. Has been using Ensure and is wanting something to help with his appetite. Previous was on Megace with some improvements in weight. Will work with [place]  [employer] account planner) to get Ensure and will trial Megace.   Terri Piedra, NP 03/19/2022 2:28 PM

## 2022-03-19 NOTE — Telephone Encounter (Signed)
Spoke with [employer]. See other phone note. Can we please contact Amber from [facility] to get him Ensure?

## 2022-03-19 NOTE — Telephone Encounter (Signed)
Patient left voicemail with front desk requesting to speak with Terri Piedra, FNP. In message states he does not want to speak to anyone other than his provider.  Leatrice Jewels, RMA

## 2022-03-21 ENCOUNTER — Other Ambulatory Visit: Payer: Self-pay | Admitting: Infectious Diseases

## 2022-03-21 MED ORDER — ENSURE ACTIVE HIGH PROTEIN PO LIQD
1.0000 | Freq: Two times a day (BID) | ORAL | 11 refills | Status: DC
Start: 2022-03-21 — End: 2023-03-02

## 2022-03-31 NOTE — Progress Notes (Deleted)
Cardiology Office Note:    Date:  03/31/2022   ID:  Rodney Chase, DOB 1975/10/06, MRN [id number]  PCP:  Rick Duff, [place] Providers Cardiologist:  None { Click to update primary MD,subspecialty MD or APP then REFRESH:1}    Referring MD: Sid Falcon, MD   CC: *** Consulted for the evaluation of palpitations at the behest of Dr. Daryll Drown   History of Present Illness:    Rodney Chase is a 47 y.o. male with a hx of HIV and Tobacco abuse.  Patient notes that (s)he is feeling ***.   Was last feeling well ***. Able to ***  Has had no chest pain, chest pressure, chest tightness, chest stinging ***.  Discomfort occurs with ***, worsens with ***, and improves with ***.    Patient exertion notable for *** with *** and feels no symptoms.    No shortness of breath, DOE ***.  No PND or orthopnea***.  No weight gain***, leg swelling ***, or abdominal swelling***.  No syncope or near syncope ***. Notes *** no palpitations or funny heart beats.     Patient reports prior cardiac testing including ***  No history of ***pre-eclampsia, gestation HTN or gestational DM.  No Fen-Phen or drug use***.  Ambulatory BP ***.   Past Medical History:  Diagnosis Date   HIV (human immunodeficiency virus infection) ([place])    Tobacco use     No past surgical history on file.  Current Medications: No outpatient medications have been marked as taking for the 04/01/22 encounter (Appointment) with Werner Lean, MD.     Allergies:   Penicillins   Social History   Socioeconomic History   Marital status: Single    Spouse name: Not on file   Number of children: Not on file   Years of education: Not on file   Highest education level: Not on file  Occupational History   Not on file  Tobacco Use   Smoking status: Every Day    Packs/day: 0.30    Types: Cigarettes   Smokeless tobacco: Never  Vaping Use   Vaping Use: Never used  Substance and Sexual Activity    Alcohol use: Yes    Comment: occasionally   Drug use: Yes    Types: Marijuana    Comment: daily   Sexual activity: Not Currently    Partners: Male  Other Topics Concern   Not on file  Social History Narrative   Lives with roommate   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  ROS:   Please see the history of present illness.    *** All other systems reviewed and are negative.  EKGs/Labs/Other Studies Reviewed:    The following studies were reviewed today:   EKG:  EKG is *** ordered today.  The ekg ordered today demonstrates *** 08/25/2017: SR rate 75 diffuse ST elevations   Recent Labs: 09/17/2021: ALT 10; BUN 17; Creat 1.07; Potassium 4.4; Sodium 139  Recent  Lipid Panel    Component Value Date/Time   CHOL 164 05/23/2021 0248   TRIG 61 05/23/2021 0248   HDL 54 05/23/2021 0248   CHOLHDL 3.0 05/23/2021 0248   VLDL 15 12/28/2015 1043   LDLCALC 95 05/23/2021 0248     Risk Assessment/Calculations:   {Does this patient have ATRIAL FIBRILLATION?[phone]}  No BP recorded.  {Refresh Note OR Click here to enter BP  :1}***         Physical Exam:    VS:  There were no vitals taken for this visit.    Wt Readings from Last 3 Encounters:  01/13/22 122 lb 9.6 oz (55.6 kg)  09/17/21 120 lb (54.4 kg)  05/23/21 123 lb (55.8 kg)     GEN: *** Well nourished, well developed in no acute distress HEENT: Normal NECK: No JVD; No carotid bruits LYMPHATICS: No  lymphadenopathy CARDIAC: ***RRR, no murmurs, rubs, gallops RESPIRATORY:  Clear to auscultation without rales, wheezing or rhonchi  ABDOMEN: Soft, non-tender, non-distended MUSCULOSKELETAL:  No edema; No deformity  SKIN: Warm and dry NEUROLOGIC:  Alert and oriented x 3 PSYCHIATRIC:  Normal affect   ASSESSMENT:    No diagnosis found. PLAN:    Palpitations; possible SVT PACs/PVCs*** - will obtain ***-day live/non live heart monitor (ZioPatch/[employer]) - AV Nodal Therapy: *** - AAD: - EP evaluation:   - TSH: ***       {Are you ordering a CV Procedure (e.g. stress test, cath, DCCV, TEE, etc)?   Press F2        :[id number]    Medication Adjustments/Labs and Tests Ordered: Current medicines are reviewed at length with the patient today.  Concerns regarding medicines are outlined above.  No orders of the defined types were placed in this encounter.  No orders of the defined types were placed in this encounter.   There are no Patient Instructions on file for this visit.   Signed, Werner Lean, MD  03/31/2022 8:11 AM    [place]

## 2022-04-01 ENCOUNTER — Ambulatory Visit: Payer: Medicaid Other | Admitting: Internal Medicine

## 2022-04-01 NOTE — Progress Notes (Unsigned)
Cardiology Office Note:    Date:  04/02/2022   ID:  Rodney Chase, DOB Mar 28, 1975, MRN [id number]  PCP:  Rick Duff, [place] Providers Cardiologist:  None     Referring MD: Sid Falcon, MD   Consulted for the evaluation of palpitations at the behest of Dr. Daryll Drown  NO SHOW.  NO CHARGE.  CHART REVIEWED.    History of Present Illness:    Rodney Chase is a 47 y.o. male with a hx of HIV and Tobacco abuse.  NO SHOW.  NO CHARGE.  CHART REVIEWED.    Past Medical History:  Diagnosis Date   Floaters with photopsia    HIV (human immunodeficiency virus infection) ([place])    Palpitations    Tobacco use     Past Surgical History:  Procedure Laterality Date   NO PAST SURGERIES      Current Medications: No outpatient medications have been marked as taking for the 04/02/22 encounter (Office Visit) with Werner Lean, MD.     Allergies:   Penicillins   Social History   Socioeconomic History   Marital status: Single    Spouse name: Not on file   Number of children: Not on file   Years of education: Not on file   Highest education level: Not on file  Occupational History   Not on file  Tobacco Use   Smoking status: Every Day    Packs/day: 0.30    Types: Cigarettes   Smokeless tobacco: Never  Vaping Use   Vaping Use: Never used  Substance and Sexual Activity   Alcohol use: Yes    Comment: occasionally   Drug use: Yes    Types: Marijuana    Comment: daily   Sexual activity: Not Currently    Partners: Male  Other Topics Concern   Not on file  Social History Narrative   Lives with roommate   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  ROS:   Please see the history of present illness.     All other systems reviewed and are negative.  EKGs/Labs/Other Studies Reviewed:    The following studies were reviewed today:   EKG:  EKG is  ordered today.   08/25/2017: SR rate 75 diffuse ST elevations   Recent Labs: 09/17/2021: ALT 10; BUN 17; Creat 1.07; Potassium 4.4; Sodium 139  Recent Lipid Panel    Component Value Date/Time   CHOL 164 05/23/2021 0248   TRIG 61 05/23/2021 0248   HDL 54 05/23/2021 0248   CHOLHDL 3.0 05/23/2021 0248   VLDL 15 12/28/2015 1043   LDLCALC 95 05/23/2021 0248    Physical Exam:    VS:  There were no vitals taken for this visit.    Wt Readings from Last 3 Encounters:  01/13/22 122 lb 9.6 oz (55.6 kg)  09/17/21 120 lb (54.4 kg)  05/23/21 123 lb (55.8 kg)    NO SHOW.  NO CHARGE.  CHART REVIEWED.  ASSESSMENT:    No diagnosis found. PLAN:    Palpitations; possible SVT   NO SHOW.  NO CHARGE.  CHART REVIEWED.    Medication Adjustments/Labs and Tests Ordered: Current medicines are reviewed at length with the patient today.  Concerns regarding medicines are outlined above.  No orders of the defined types were placed in this encounter.  No orders of the defined types were placed in this encounter.   There are no Patient Instructions on file for this visit.   Signed, Werner Lean, MD  04/02/2022 4:16 PM    [place]

## 2022-04-02 ENCOUNTER — Ambulatory Visit: Payer: Medicaid Other | Admitting: Internal Medicine

## 2022-04-30 ENCOUNTER — Other Ambulatory Visit: Payer: Self-pay

## 2022-04-30 ENCOUNTER — Emergency Department (HOSPITAL_BASED_OUTPATIENT_CLINIC_OR_DEPARTMENT_OTHER)
Admission: EM | Admit: 2022-04-30 | Discharge: 2022-04-30 | Disposition: A | Discharge status: Home / Self Care | Payer: BLUE CROSS/BLUE SHIELD | Attending: Emergency Medicine | Admitting: Emergency Medicine

## 2022-04-30 ENCOUNTER — Emergency Department (HOSPITAL_BASED_OUTPATIENT_CLINIC_OR_DEPARTMENT_OTHER): Payer: BLUE CROSS/BLUE SHIELD

## 2022-04-30 ENCOUNTER — Encounter (HOSPITAL_BASED_OUTPATIENT_CLINIC_OR_DEPARTMENT_OTHER): Payer: Self-pay

## 2022-04-30 DIAGNOSIS — Z1152 Encounter for screening for COVID-19: Secondary | ICD-10-CM | POA: Insufficient documentation

## 2022-04-30 DIAGNOSIS — Z21 Asymptomatic human immunodeficiency virus [HIV] infection status: Secondary | ICD-10-CM | POA: Insufficient documentation

## 2022-04-30 DIAGNOSIS — R051 Acute cough: Secondary | ICD-10-CM | POA: Insufficient documentation

## 2022-04-30 DIAGNOSIS — R059 Cough, unspecified: Secondary | ICD-10-CM | POA: Diagnosis present

## 2022-04-30 LAB — RESP PANEL BY RT-PCR (RSV, FLU A&B, COVID)  RVPGX2
Influenza A by PCR: NEGATIVE
Influenza B by PCR: NEGATIVE
Resp Syncytial Virus by PCR: NEGATIVE
SARS Coronavirus 2 by RT PCR: NEGATIVE

## 2022-04-30 NOTE — ED Triage Notes (Signed)
Fatigue, weakness, headaches, cough, congestion, shortness of breath.

## 2022-04-30 NOTE — ED Provider Notes (Signed)
[facility] Provider Note   CSN: [id number] Arrival date & time: 04/30/22  [id number]     History  Chief Complaint  Patient presents with   FLu Like Symptoms    Rodney Chase is a 47 y.o. male.  HPI    Patient with history of HIV that is well-controlled presents with cough and congestion.  Patient reports for the past 4 days she has had nasal congestion, sneezing, eye watering and cough.  He has had chest tightness with coughing.  No hemoptysis.  No recorded fevers or vomiting. He had a COVID test at home that was inconclusive Home Medications Prior to Admission medications   Medication Sig Start Date End Date Taking? Authorizing Provider  Nutritional Supplements (ENSURE ACTIVE HIGH PROTEIN) LIQD Take 1 each by mouth in the morning and at bedtime. 03/21/22   Calera Callas, NP  bictegravir-emtricitabine-tenofovir AF (BIKTARVY) 50-200-25 MG TABS tablet Take 1 tablet by mouth daily. 09/17/21   Golden Circle, FNP  megestrol (MEGACE ES) 625 MG/5ML suspension Take 5 mLs (625 mg total) by mouth daily. 03/19/22   Golden Circle, FNP      Allergies    Penicillins    Review of Systems   Review of Systems  Respiratory:  Positive for cough.     Physical Exam Updated Vital Signs BP (!) 127/91 (BP Location: Left Arm)   Pulse 86   Temp 98 F (36.7 C)   Resp 18   Ht 1.626 m ('5\' 4"'$ )   Wt 61.2 kg   SpO2 95%   BMI 23.17 kg/m  Physical Exam CONSTITUTIONAL: Well developed/well nourished, no distress HEAD: Normocephalic/atraumatic EYES: EOMI/PERRL ENMT: Mucous membranes moist, uvula midline erythema or exudate, no stridor, no drooling NECK: supple no meningeal signs CV: S1/S2 noted, no murmurs/rubs/gallops noted LUNGS: Lungs are clear to auscultation bilaterally, no apparent distress NEURO: Pt is awake/alert/appropriate, moves all extremitiesx4.  No facial droop.   EXTREMITIES: pulses normal/equal, full ROM SKIN: warm, color  normal PSYCH: no abnormalities of mood noted, alert and oriented to situation  ED Results / Procedures / Treatments   Labs (all labs ordered are listed, but only abnormal results are displayed) Labs Reviewed  RESP PANEL BY RT-PCR (RSV, FLU A&B, COVID)  [id number]    EKG None  Radiology DG Chest Portable 1 View  Result Date: 04/30/2022 CLINICAL DATA:  Cough, fatigue, weakness, congestion, and shortness of breath. EXAM: PORTABLE CHEST 1 VIEW COMPARISON:  07/08/2020. FINDINGS: The heart size and mediastinal contours are within normal limits. Both lungs are clear. No acute osseous abnormality. IMPRESSION: No active disease. Electronically Signed   By: Brett Fairy M.D.   On: 04/30/2022 01:46    Procedures Procedures    Medications Ordered in ED Medications - No data to display  ED Course/ Medical Decision Making/ A&P                             Medical Decision Making Amount and/or Complexity of Data Reviewed Radiology: ordered.   Patient well-appearing, no acute distress.  Lungs are clear.  Vitals are appropriate.  Viral panel negative.  X-ray was reviewed and negative.  Suspect other virus versus allergic type symptoms.  Patient appears to be well-controlled on his Biktarvy He is safe for discharge        Final Clinical Impression(s) / ED Diagnoses Final diagnoses:  Acute cough    Rx / DC Orders  ED Discharge Orders     None         Ripley Fraise, MD 04/30/22 [phone]

## 2022-06-09 ENCOUNTER — Other Ambulatory Visit (HOSPITAL_COMMUNITY): Payer: Self-pay

## 2022-06-13 ENCOUNTER — Other Ambulatory Visit (HOSPITAL_COMMUNITY): Payer: Self-pay

## 2022-06-13 ENCOUNTER — Other Ambulatory Visit: Payer: Self-pay

## 2022-06-16 ENCOUNTER — Other Ambulatory Visit (HOSPITAL_COMMUNITY): Payer: Self-pay

## 2022-06-16 ENCOUNTER — Ambulatory Visit: Payer: Medicaid Other | Admitting: Family

## 2022-06-17 ENCOUNTER — Ambulatory Visit: Payer: Medicaid Other | Admitting: Family

## 2022-06-18 ENCOUNTER — Telehealth: Payer: Self-pay

## 2022-06-18 NOTE — Telephone Encounter (Signed)
Per Marcos Eke ok to give 1 bottle of Biktarvy ONCE to last until appt 06/24/22 that must be kept.

## 2022-06-23 ENCOUNTER — Other Ambulatory Visit: Payer: Self-pay | Admitting: Pharmacist

## 2022-06-23 DIAGNOSIS — B20 Human immunodeficiency virus [HIV] disease: Secondary | ICD-10-CM

## 2022-06-23 MED ORDER — BIKTARVY 50-200-25 MG PO TABS: 1.0000 | ORAL_TABLET | Freq: Every day | ORAL | 0 refills | Status: DC

## 2022-06-23 NOTE — Progress Notes (Signed)
Medication Samples have been provided to the patient.  Drug name: Biktarvy        Strength: 50/200/25 mg       Qty: 7 tablets (1 bottle)   LOT: [id number]   Exp.Date: 10/01/24  Dosing instructions: Take one tablet by mouth once daily  The patient has been instructed regarding the correct time, dose, and frequency of taking this medication, including desired effects and most common side effects.   Haillie Radu L. Jannette Fogo, PharmD, BCIDP, AAHIVP, CPP Clinical Pharmacist Practitioner Infectious Diseases Clinical Pharmacist [facility] 02/13/2020, 10:07 AM

## 2022-06-24 ENCOUNTER — Other Ambulatory Visit: Payer: Self-pay

## 2022-06-24 ENCOUNTER — Other Ambulatory Visit (HOSPITAL_COMMUNITY): Payer: Self-pay

## 2022-06-24 ENCOUNTER — Encounter: Payer: Self-pay | Admitting: Family

## 2022-06-24 ENCOUNTER — Ambulatory Visit (INDEPENDENT_AMBULATORY_CARE_PROVIDER_SITE_OTHER): Payer: BLUE CROSS/BLUE SHIELD | Admitting: Family

## 2022-06-24 VITALS — BP 122/85 | HR 85 | Temp 98.2°F | Resp 16 | Wt 125.0 lb

## 2022-06-24 DIAGNOSIS — Z9189 Other specified personal risk factors, not elsewhere classified: Secondary | ICD-10-CM | POA: Diagnosis not present

## 2022-06-24 DIAGNOSIS — Z113 Encounter for screening for infections with a predominantly sexual mode of transmission: Secondary | ICD-10-CM

## 2022-06-24 DIAGNOSIS — B20 Human immunodeficiency virus [HIV] disease: Secondary | ICD-10-CM

## 2022-06-24 DIAGNOSIS — Z0000 Encounter for general adult medical examination without abnormal findings: Secondary | ICD-10-CM

## 2022-06-24 MED ORDER — MEGESTROL ACETATE 625 MG/5ML PO SUSP: 625.0000 mg | Freq: Every day | ORAL | 2 refills | Status: DC

## 2022-06-24 MED ORDER — BIKTARVY 50-200-25 MG PO TABS: 1.0000 | ORAL_TABLET | Freq: Every day | ORAL | 5 refills | Status: DC

## 2022-06-24 MED ORDER — MEGESTROL ACETATE 40 MG/ML PO SUSP: 625.0000 mg | Freq: Every day | ORAL | 1 refills | Status: DC

## 2022-06-24 NOTE — Assessment & Plan Note (Signed)
Discussed importance of safe sexual practice and condom use. Condoms and STD testing offered.  At risk for colon cancer screening and referral to GI placed. Declines vaccines.  Will schedule routine dental care.

## 2022-06-24 NOTE — Patient Instructions (Addendum)
Nice to see you.  We will check your lab work today.  Continue to take your medication daily as prescribed.  Refills have been sent to the pharmacy.  Plan for follow up in 4 months or sooner if needed with lab work on the same day.  Have a great day and stay safe!  

## 2022-06-24 NOTE — Progress Notes (Signed)
Brief Narrative   Patient ID: Rodney Chase, male    DOB: 07-28-1975, 47 y.o.   MRN: [id number]  Rodney Chase is a 47 y/o male diagnosed with HIV disease in August 2007 with risk factor of MSM. Initial viral load and CD4 count are unavailable. Genotypes with no medication resistant mutations. No history of opportunistic infection. [id number] negative. Previous ART experience with [facility] and [facility].    Subjective:    Chief Complaint  Patient presents with   Follow-up    B20     HPI:  Rodney Chase is a 47 y.o. male with HIV disease last seen on 09/17/2021 with well-controlled virus and good adherence and tolerance to [employer].  Viral load was undetectable with CD4 count of 733.  RPR was nonreactive for syphilis.  Kidney function, liver function, electrolytes within normal ranges.  Here today for follow-up.  Rodney Chase has been doing okay since his last office visit.  He is now financially covered through [place].  Has concerns about the co-pay associated with his medications.  Continues to take Biktarvy as prescribed with no adverse side effects.  Condoms and STD testing offered.  Declines vaccination.  Requesting refill of Megace to help with weight gain.  Denies fevers, chills, night sweats, headaches, changes in vision, neck pain/stiffness, nausea, diarrhea, vomiting, lesions or rashes.   Allergies  Allergen Reactions   Penicillins     Has patient had a PCN reaction causing immediate rash, facial/tongue/throat swelling, SOB or lightheadedness with hypotension: YES Has patient had a PCN reaction causing severe rash involving mucus membranes or skin necrosis: NO Has patient had a PCN reaction that required hospitalization: UNK Has patient had a PCN reaction occurring within the last 10 years: NO If all of the above answers are "NO", then may proceed with Cephalosporin use.      Outpatient Medications Prior to Visit  Medication Sig Dispense Refill   Nutritional Supplements  (ENSURE ACTIVE HIGH PROTEIN) LIQD Take 1 each by mouth in the morning and at bedtime. 474 mL 11   bictegravir-emtricitabine-tenofovir AF (BIKTARVY) 50-200-25 MG TABS tablet Take 1 tablet by mouth daily. 30 tablet 5   bictegravir-emtricitabine-tenofovir AF (BIKTARVY) 50-200-25 MG TABS tablet Take 1 tablet by mouth daily for 7 days. 7 tablet 0   megestrol (MEGACE ES) 625 MG/5ML suspension Take 5 mLs (625 mg total) by mouth daily. 150 mL 2   No facility-administered medications prior to visit.     Past Medical History:  Diagnosis Date   Floaters with photopsia    HIV (human immunodeficiency virus infection)    Palpitations    Tobacco use      Past Surgical History:  Procedure Laterality Date   NO PAST SURGERIES        Review of Systems  Constitutional:  Negative for appetite change, chills, fatigue, fever and unexpected weight change.  Eyes:  Negative for visual disturbance.  Respiratory:  Negative for cough, chest tightness, shortness of breath and wheezing.   Cardiovascular:  Negative for chest pain and leg swelling.  Gastrointestinal:  Negative for abdominal pain, constipation, diarrhea, nausea and vomiting.  Genitourinary:  Negative for dysuria, flank pain, frequency, genital sores, hematuria and urgency.  Skin:  Negative for rash.  Allergic/Immunologic: Negative for immunocompromised state.  Neurological:  Negative for dizziness and headaches.      Objective:    BP 122/85   Pulse 85   Temp 98.2 F (36.8 C) (Oral)   Resp 16   Wt 125 lb (  56.7 kg)   SpO2 99%   BMI 21.46 kg/m  Nursing note and vital signs reviewed.  Physical Exam Constitutional:      General: He is not in acute distress.    Appearance: He is well-developed.  Eyes:     Conjunctiva/sclera: Conjunctivae normal.  Cardiovascular:     Rate and Rhythm: Normal rate and regular rhythm.     Heart sounds: Normal heart sounds. No murmur heard.    No friction rub. No gallop.  Pulmonary:     Effort:  Pulmonary effort is normal. No respiratory distress.     Breath sounds: Normal breath sounds. No wheezing or rales.  Chest:     Chest wall: No tenderness.  Abdominal:     General: Bowel sounds are normal.     Palpations: Abdomen is soft.     Tenderness: There is no abdominal tenderness.  Musculoskeletal:     Cervical back: Neck supple.  Lymphadenopathy:     Cervical: No cervical adenopathy.  Skin:    General: Skin is warm and dry.     Findings: No rash.  Neurological:     Mental Status: He is alert and oriented to person, place, and time.  Psychiatric:        Behavior: Behavior normal.        Thought Content: Thought content normal.        Judgment: Judgment normal.         01/13/2022    2:36 PM 09/17/2021   10:18 AM 01/14/2021    2:06 PM 11/21/2020   10:36 AM 05/04/2020    9:13 AM  Depression screen PHQ 2/9  Decreased Interest 2 0 3 0 0  Down, Depressed, Hopeless 1 0 1 0 0  PHQ - 2 Score 3 0 4 0 0  Altered sleeping Tired, decreased energy Change in appetite Feeling bad or failure about yourself  1  3 0   Trouble concentrating 0  1 2   Moving slowly or fidgety/restless Suicidal thoughts 0  0 0   PHQ-9 Score Difficult doing work/chores Somewhat difficult  Somewhat difficult Not difficult at all        Assessment & Plan:    Patient Active Problem List   Diagnosis Date Noted   Palpitations 01/13/2022   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 Reviewed previous lab work and discussed plan  of care. Has financial coverage through Medicaid. Will refill Megace once to assist with appetite and weight gain. Check lab work today. Continue current dose of Biktarvy. Plan for follow up in 4 months or sooner if needed with lab work on the same day.       Relevant Medications   bictegravir-emtricitabine-tenofovir AF (BIKTARVY) 50-200-25 MG TABS tablet   Other Relevant Orders   COMPLETE METABOLIC PANEL WITH GFR   HIV-1 RNA quant-no reflex-bld   T-helper cell (CD4)- ([facility] only)  Healthcare maintenance    Discussed importance of safe sexual practice and condom use. Condoms and STD testing offered.  At risk for colon cancer screening and referral to GI placed. Declines vaccines.  Will schedule routine dental care.       Other Visit Diagnoses     Screening for STDs (sexually transmitted diseases)       Relevant Orders   RPR   At risk for colon cancer       Relevant Orders   Ambulatory referral to Gastroenterology        I have discontinued Rodney Chase's megestrol, megestrol, and megestrol. I am also having him start on megestrol. Additionally, I am having him maintain his Ensure Active High Protein and Biktarvy.   Meds ordered this encounter  Medications   bictegravir-emtricitabine-tenofovir AF (BIKTARVY) 50-200-25 MG TABS tablet    Sig: Take 1 tablet by mouth daily.    Dispense:  30 tablet    Refill:  5    Order Specific Question:   Supervising Provider    Answer:   Judyann Munson [[id number]]   DISCONTD: megestrol (MEGACE ES) 625 MG/5ML suspension    Sig: Take 5 mLs (625 mg total) by mouth daily.    Dispense:  150 mL    Refill:  2    Order Specific Question:   Supervising Provider    Answer:   Drue Second, CYNTHIA [[id number]]   DISCONTD: megestrol (MEGACE ES) 625 MG/5ML suspension    Sig: Take 5 mLs (625 mg total) by mouth daily.    Dispense:  150 mL    Refill:  2    Order Specific Question:   Supervising Provider    Answer:   Judyann Munson [[id number]]   megestrol  (MEGACE) 40 MG/ML suspension    Sig: Take 15.6 mLs (625 mg total) by mouth daily.    Dispense:  480 mL    Refill:  1    Please discontinue previous prescription for Megace    Order Specific Question:   Supervising Provider    Answer:   Judyann Munson [[id number]]     Follow-up: Return in about 4 months (around 10/24/2022), or if symptoms worsen or fail to improve.   Marcos Eke, MSN, FNP-C Nurse Practitioner [facility] for Infectious Disease [facility] Main number: [phone]

## 2022-06-24 NOTE — Assessment & Plan Note (Addendum)
Rodney Chase continues to have well controlled virus with good adherence and tolerance to [employer]. Reviewed previous lab work and discussed plan of care. Has financial coverage through Medicaid. Will refill Megace once to assist with appetite and weight gain. Check lab work today. Continue current dose of Biktarvy. Plan for follow up in 4 months or sooner if needed with lab work on the same day.

## 2022-06-25 LAB — T-HELPER CELL (CD4) - (RCID CLINIC ONLY)
CD4 % Helper T Cell: 37 % (ref 33–65)
CD4 T Cell Abs: 733 /uL (ref 400–1790)

## 2022-06-26 LAB — COMPLETE METABOLIC PANEL WITH GFR
AG Ratio: 1.6 (calc) (ref 1.0–2.5)
ALT: 8 U/L — ABNORMAL LOW (ref 9–46)
AST: 15 U/L (ref 10–40)
Albumin: 3.8 g/dL (ref 3.6–5.1)
Alkaline phosphatase (APISO): 46 U/L (ref 36–130)
BUN: 13 mg/dL (ref 7–25)
CO2: 29 mmol/L (ref 20–32)
Calcium: 8.8 mg/dL (ref 8.6–10.3)
Chloride: 105 mmol/L (ref 98–110)
Creat: 1 mg/dL (ref 0.60–1.29)
Globulin: 2.4 g/dL (calc) (ref 1.9–3.7)
Glucose, Bld: 94 mg/dL (ref 65–99)
Potassium: 4.3 mmol/L (ref 3.5–5.3)
Sodium: 140 mmol/L (ref 135–146)
Total Bilirubin: 0.5 mg/dL (ref 0.2–1.2)
Total Protein: 6.2 g/dL (ref 6.1–8.1)
eGFR: 95 mL/min/{1.73_m2} (ref >=60)

## 2022-06-26 LAB — RPR: RPR Ser Ql: NONREACTIVE

## 2022-06-26 LAB — HIV-1 RNA QUANT-NO REFLEX-BLD
HIV 1 RNA Quant: NOT DETECTED Copies/mL
HIV-1 RNA Quant, Log: NOT DETECTED Log cps/mL

## 2022-07-03 ENCOUNTER — Other Ambulatory Visit: Payer: Self-pay | Admitting: Family

## 2022-07-03 DIAGNOSIS — B20 Human immunodeficiency virus [HIV] disease: Secondary | ICD-10-CM

## 2022-08-04 ENCOUNTER — Encounter: Payer: Self-pay | Admitting: *Deleted

## 2022-08-14 NOTE — Progress Notes (Signed)
The 10-year ASCVD risk score (Arnett DK, et al., 2019) is: 6%   Values used to calculate the score:     Age: 47 years     Sex: Male     Is Non-Hispanic African American: Yes     Diabetic: No     Tobacco smoker: Yes     Systolic Blood Pressure: 122 mmHg     Is BP treated: No     HDL Cholesterol: 54 mg/dL     Total Cholesterol: 164 mg/dL  Sandie Ano, RN

## 2022-09-09 ENCOUNTER — Other Ambulatory Visit: Payer: Self-pay

## 2022-09-09 ENCOUNTER — Encounter: Payer: Self-pay | Admitting: Family

## 2022-09-09 ENCOUNTER — Telehealth (INDEPENDENT_AMBULATORY_CARE_PROVIDER_SITE_OTHER): Payer: BLUE CROSS/BLUE SHIELD | Admitting: Family

## 2022-09-09 DIAGNOSIS — B20 Human immunodeficiency virus [HIV] disease: Secondary | ICD-10-CM | POA: Diagnosis not present

## 2022-09-09 DIAGNOSIS — R002 Palpitations: Secondary | ICD-10-CM | POA: Diagnosis not present

## 2022-09-09 NOTE — Progress Notes (Signed)
Patient ID: Rodney Chase, male    DOB: 1975/03/27, 47 y.o.   MRN: [id number]  Subjective:    Chief Complaint  Patient presents with   Follow-up    b20     Virtual Visit via Telephone/Video Note   I connected with Rodney Chase on 09/09/2022 at 12:58 PM by video and verified that I am speaking with the correct person using two identifiers.   I discussed the limitations, risks, security and privacy concerns of performing an evaluation and management service by telephone and the availability of in person appointments. I also discussed with the patient that there may be a patient responsible charge related to this service. The patient expressed understanding and agreed to proceed.  Location:  Patient: Home Provider: [facility] HPI:  Rodney Chase is a 47 y.o. male with HIV disease last seen on 06/24/22 with well controlled virus and good adherence and tolerance to Biktarvy. Viral load was undetectable an CD4 count 733. Telehealth visit today for an acute visit.   Rodney Chase currently has housing assistance and is requesting a letter supporting need for a 2 bedroom apartment secondary to need of having occasional family members/guest stay with him as a result of heart palpitations/syncopal episodes that occur on average about 4-5 times per year and has someone stay with him during those times. Last event was a few months ago and notes events are precipitated with anxiety and panic and can occur at any given time. Has had an appointment with Cardiology and unfortunately has not showed for those appointments at this time. Continues to take his Biktarvy as prescribed with no adverse side effects.    Allergies  Allergen Reactions   Penicillins     Has patient had a PCN reaction causing immediate rash, facial/tongue/throat swelling, SOB or lightheadedness with hypotension: YES Has patient had a PCN reaction causing severe rash involving mucus membranes or skin necrosis: NO Has patient had a PCN  reaction that required hospitalization: UNK Has patient had a PCN reaction occurring within the last 10 years: NO If all of the above answers are "NO", then may proceed with Cephalosporin use.      Outpatient Medications Prior to Visit  Medication Sig Dispense Refill   bictegravir-emtricitabine-tenofovir AF (BIKTARVY) 50-200-25 MG TABS tablet Take 1 tablet by mouth daily. 30 tablet 5   megestrol (MEGACE) 40 MG/ML suspension Take 15.6 mLs (625 mg total) by mouth daily. 480 mL 1   Nutritional Supplements (ENSURE ACTIVE HIGH PROTEIN) LIQD Take 1 each by mouth in the morning and at bedtime. 474 mL 11   No facility-administered medications prior to visit.     Past Medical History:  Diagnosis Date   Floaters with photopsia    HIV (human immunodeficiency virus infection) (HCC)    Palpitations    Tobacco use      Past Surgical History:  Procedure Laterality Date   NO PAST SURGERIES        Review of Systems  Constitutional:  Negative for appetite change, chills, fatigue, fever and unexpected weight change.  Eyes:  Negative for visual disturbance.  Respiratory:  Negative for cough, chest tightness, shortness of breath and wheezing.   Cardiovascular:  Positive for palpitations. Negative for chest pain and leg swelling.  Gastrointestinal:  Negative for abdominal pain, constipation, diarrhea, nausea and vomiting.  Genitourinary:  Negative for dysuria, flank pain, frequency, genital sores, hematuria and urgency.  Skin:  Negative for rash.  Allergic/Immunologic: Negative for immunocompromised state.  Neurological:  Negative  for dizziness and headaches.      Objective:    There were no vitals taken for this visit. Nursing note and vital signs reviewed.  Physical Exam Constitutional:      General: He is not in acute distress.    Appearance: Normal appearance. He is well-developed. He is not ill-appearing.  Neurological:     Mental Status: He is alert.  Psychiatric:        Mood  and Affect: Mood normal.         01/13/2022    2:36 PM 09/17/2021   10:18 AM 01/14/2021    2:06 PM 11/21/2020   10:36 AM 05/04/2020    9:13 AM  Depression screen PHQ 2/9  Decreased Interest 2 0 3 0 0  Down, Depressed, Hopeless 1 0 1 0 0  PHQ - 2 Score 3 0 4 0 0  Altered sleeping 2  3 3    Tired, decreased energy 2  3 1    Change in appetite 3  1 2    Feeling bad or failure about yourself  1  3 0   Trouble concentrating 0  1 2   Moving slowly or fidgety/restless 3  3 3    Suicidal thoughts 0  0 0   PHQ-9 Score 14  18 11    Difficult doing work/chores Somewhat difficult  Somewhat difficult Not difficult at all        Assessment & Plan:    Patient Active Problem List   Diagnosis Date Noted   Palpitations 01/13/2022   Floaters with photopsia 01/13/2022   Therapeutic drug monitoring 09/17/2021   GAD (generalized anxiety disorder) 11/22/2020   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 Reviewed previous lab work. Continue current dose of Biktarvy.       Palpitations - Primary    Rodney Chase continues to have "episodes" concerning for syncope with recommendation for follow up with cardiology for further evaluation as he describes his heart racing prior to symptoms. Unfortunately has missed a couple of scheduled appointments and have emphasized importance of work up to ensure no cardiology origin of his symptoms. Requested a letter for housing assistance about 2 bedroom which I feel is a reasonable if he has people stay with him regarding these episodes. He will contact Cardiology to reschedule. I will place a referral if needed.         I am having Rodney Chase maintain his Ensure Active High Protein, Biktarvy, and megestrol.   No  orders of the defined types were placed in this encounter.    I discussed the assessment and treatment plan with the patient. The patient was provided an opportunity to ask questions and all were answered. The patient agreed with the plan and demonstrated an understanding of the instructions.   The patient was advised to call back or seek an in-person evaluation if the symptoms worsen or if the condition fails to improve as anticipated.   I provided 20  minutes of non-face-to-face time during this encounter.  Follow-up: With Cardiology and for routine HIV follow up.    Marcos Eke, MSN, FNP-C Nurse Practitioner [facility] for Infectious Disease [facility] Main number: [phone]

## 2022-09-09 NOTE — Assessment & Plan Note (Addendum)
Rodney Chase continues to have "episodes" concerning for syncope with recommendation for follow up with cardiology for further evaluation as he describes his heart racing prior to symptoms. Unfortunately has missed a couple of scheduled appointments and have emphasized importance of work up to ensure no cardiology origin of his symptoms. Requested a letter for housing assistance about 2 bedroom which I feel is a reasonable if he has people stay with him regarding these episodes. He will contact Cardiology to reschedule. I will place a referral if needed.

## 2022-09-09 NOTE — Assessment & Plan Note (Signed)
Well controlled with current dose of Biktarvy. Reviewed previous lab work. Continue current dose of Biktarvy.

## 2022-09-09 NOTE — Patient Instructions (Signed)
Nice to see you.  We will write a letter per your request.   Please call Cardiology and Gastroenterology to follow up.  Clear out your voicemail and get access to MyChart.   Follow up as scheduled in September.   Have a great day and stay safe!

## 2022-09-15 NOTE — Telephone Encounter (Signed)
Called patient to inform him [facility] is not able to schedule due to multiple no shows with them. Provided patient with number for Dr. Neva Seat and requested he call them to schedule appointment.  Juanita Laster, RMA

## 2022-09-18 ENCOUNTER — Other Ambulatory Visit (HOSPITAL_COMMUNITY): Payer: Self-pay

## 2022-10-09 ENCOUNTER — Telehealth: Payer: Self-pay

## 2022-10-09 ENCOUNTER — Other Ambulatory Visit (HOSPITAL_COMMUNITY): Payer: Self-pay

## 2022-10-09 NOTE — Telephone Encounter (Signed)
Patient called wanting to know who he can go to for dental services since he has been discharged from [facility]. Advised him he has both BCBS and Medicaid Management and should call his insurers to see if he has Secretary/administrator.   Sandie Ano, RN

## 2022-10-28 ENCOUNTER — Ambulatory Visit (HOSPITAL_COMMUNITY)
Admission: EM | Admit: 2022-10-28 | Discharge: 2022-10-28 | Disposition: A | Discharge status: Home / Self Care | Payer: BLUE CROSS/BLUE SHIELD | Attending: Family Medicine | Admitting: Family Medicine

## 2022-10-28 ENCOUNTER — Encounter (HOSPITAL_COMMUNITY): Payer: Self-pay

## 2022-10-28 DIAGNOSIS — L0291 Cutaneous abscess, unspecified: Secondary | ICD-10-CM | POA: Diagnosis not present

## 2022-10-28 MED ORDER — CLINDAMYCIN HCL 300 MG PO CAPS: 300.0000 mg | ORAL_CAPSULE | Freq: Three times a day (TID) | ORAL | 0 refills | Status: AC

## 2022-10-28 NOTE — Discharge Instructions (Signed)
--  Take clindamycin 300 mg-- 1 capsule 3 times daily for 7 days  Take Tylenol as needed for pain  Continue doing warm compresses or warm soaks in the bath

## 2022-10-28 NOTE — ED Triage Notes (Signed)
Pt presents with c/o an abscess on his groin X 1 week. Pt states it started draining and then stopped.

## 2022-10-28 NOTE — ED Provider Notes (Signed)
[facility]    CSN: [id number] Arrival date & time: 10/28/22  4259      History   Chief Complaint Chief Complaint  Patient presents with   Abscess    HPI Rodney Chase is a 47 y.o. male.    Abscess Here for swelling and pain in his right posterior scrotum. It began bothering him about 1 week ago, and then drained some, and then stopped draining. Not taking anything for pain.  No f/c.  He does take [employer]. Viral load has been undetectable.  He is allergic to penicillin which causes severe reaction.    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 Authorizing Provider  clindamycin (CLEOCIN) 300 MG capsule Take 1 capsule (300 mg total) by mouth 3 (three) times daily for 7 days. 10/28/22 11/04/22 Yes Jareb Radoncic, Janace Aris, MD  bictegravir-emtricitabine-tenofovir AF (BIKTARVY) 50-200-25 MG TABS tablet Take 1 tablet by mouth daily. 06/24/22   Veryl Speak, FNP  megestrol (MEGACE) 40 MG/ML suspension Take 15.6 mLs (625 mg total) by mouth daily. 06/24/22   Veryl Speak, FNP  Nutritional Supplements (ENSURE ACTIVE HIGH PROTEIN) LIQD Take 1 each by mouth in the morning and at bedtime. 03/21/22   Blanchard Kelch, NP    Family History Family History  Problem Relation Age of Onset   Diabetes Daughter    Breast cancer Mother         breast   Cancer Maternal Grandmother    Diabetes Paternal Grandmother    Lung cancer Maternal Aunt     Social History Social History   Tobacco Use   Smoking status: Every Day    Current packs/day: 0.30    Types: Cigarettes   Smokeless tobacco: Never  Vaping Use   Vaping status: Never Used  Substance Use Topics   Alcohol use: Yes    Comment: occasionally   Drug use: Yes    Types: Marijuana    Comment: daily     Allergies   Penicillins   Review of Systems Review of Systems   Physical Exam Triage Vital Signs ED Triage Vitals  Encounter Vitals Group     BP 10/28/22 0850 (!) 130/95     Systolic BP Percentile --      Diastolic BP Percentile --      Pulse Rate 10/28/22 0849 78     Resp 10/28/22 0849 17     Temp 10/28/22 0849 98.6 F (37 C)     Temp Source 10/28/22 0849 Oral     SpO2 10/28/22 0849 100 %     Weight --      Height --      Head Circumference --      Peak Flow --      Pain Score 10/28/22 0848 6     Pain  Loc --      Pain Education --      Exclude from Growth Chart --    No data found.  Updated Vital Signs BP (!) 130/95   Pulse 78   Temp 98.6 F (37 C) (Oral)   Resp 17   SpO2 100%   Visual Acuity Right Eye Distance:   Left Eye Distance:   Bilateral Distance:    Right Eye Near:   Left Eye Near:    Bilateral Near:     Physical Exam Vitals reviewed.  Constitutional:      General: He is not in acute distress.    Appearance: He is not ill-appearing, toxic-appearing or diaphoretic.  Genitourinary:    Comments: There is an area of induration about 2.5 cm in diameter and the central portion is fluctuant.  This is on his right posterior scrotum near the perineum.  Chaperone is present during the exam. Skin:    Coloration: Skin is not pale.  Neurological:     General: No focal deficit present.     Mental Status: He is alert and oriented to person, place, and time.  Psychiatric:        Behavior: Behavior normal.      UC Treatments /  Results  Labs (all labs ordered are listed, but only abnormal results are displayed) Labs Reviewed - No data to display  EKG   Radiology No results found.  Procedures Procedures (including critical care time)  Medications Ordered in UC Medications - No data to display  Initial Impression / Assessment and Plan / UC Course  I have reviewed the triage vital signs and the nursing notes.  Pertinent labs & imaging results that were available during my care of the patient were reviewed by me and considered in my medical decision making (see chart for details).        Clindamycin is sent in to treat the infection.  We discussed incision and drainage as being the best option at this point, but he did not want to have a drainage procedure done.  He will continue to do warm compresses.  Tylenol as needed for pain. Final Clinical Impressions(s) / UC Diagnoses   Final diagnoses:  Abscess     Discharge Instructions      --Take clindamycin 300 mg-- 1 capsule 3 times daily for 7 days  Take Tylenol as needed for pain  Continue doing warm compresses or warm soaks in the bath     ED Prescriptions     Medication Sig Dispense Auth. Provider   clindamycin (CLEOCIN) 300 MG capsule Take 1 capsule (300 mg total) by mouth 3 (three) times daily for 7 days. 21 capsule Zenia Resides, MD      PDMP not reviewed this encounter.   Zenia Resides, MD 10/28/22 [phone]

## 2022-11-04 ENCOUNTER — Telehealth: Payer: Self-pay

## 2022-11-04 ENCOUNTER — Encounter: Payer: Self-pay | Admitting: Family

## 2022-11-04 ENCOUNTER — Other Ambulatory Visit: Payer: Self-pay

## 2022-11-04 ENCOUNTER — Ambulatory Visit (INDEPENDENT_AMBULATORY_CARE_PROVIDER_SITE_OTHER): Payer: BLUE CROSS/BLUE SHIELD | Admitting: Family

## 2022-11-04 VITALS — BP 108/79 | HR 97 | Resp 16 | Ht 64.0 in | Wt 127.0 lb

## 2022-11-04 DIAGNOSIS — Z113 Encounter for screening for infections with a predominantly sexual mode of transmission: Secondary | ICD-10-CM

## 2022-11-04 DIAGNOSIS — B20 Human immunodeficiency virus [HIV] disease: Secondary | ICD-10-CM

## 2022-11-04 DIAGNOSIS — Z9189 Other specified personal risk factors, not elsewhere classified: Secondary | ICD-10-CM | POA: Diagnosis not present

## 2022-11-04 DIAGNOSIS — Z0000 Encounter for general adult medical examination without abnormal findings: Secondary | ICD-10-CM

## 2022-11-04 MED ORDER — BIKTARVY 50-200-25 MG PO TABS: 1.0000 | ORAL_TABLET | Freq: Every day | ORAL | 5 refills | Status: DC

## 2022-11-04 NOTE — Progress Notes (Signed)
Brief Narrative   Patient ID: Rodney Chase, male    DOB: June 26, 1975, 47 y.o.   MRN: [id number]  [name] is a 47 y/o male diagnosed with HIV disease in August 2007 with risk factor of MSM. Initial viral load and CD4 count are unavailable. Genotypes with no medication resistant mutations. No history of opportunistic infection. [id number] negative. Previous ART experience with [facility] and [facility].    Subjective:    Chief Complaint  Patient presents with   Follow-up    B20    HPI:  Rodney Chase is a 47 y.o. male with HIV disease last seen for HIV care on 06/24/2022 with well-controlled virus and good adherence and tolerance to [employer].  Viral load was undetectable with CD4 count of 733.  Kidney function, liver function, electrolytes within normal ranges.  Recently seen in urgent care for a scrotal abscess which was treated with clindamycin.  Here today for routine follow-up.  [name] has been doing okay since his last office visit and has been out of medication for approximately 1 week secondary to cost of medication when he had attempted to pick it up from the pharmacy the cost was $500.  Currently covered through [employer] and [place].  No adverse side effects when taking medication.  Declines vaccinations.  Condoms and STD testing offered.  Has questions about long-acting injectable medication.  Denies fevers, chills, night sweats, headaches, changes in vision, neck pain/stiffness, nausea, diarrhea, vomiting, lesions or rashes.   Allergies  Allergen Reactions   Penicillins     Has patient had a PCN reaction causing immediate rash, facial/tongue/throat swelling, SOB or lightheadedness with hypotension: YES Has patient had a PCN reaction causing severe rash involving mucus membranes or skin necrosis: NO Has patient had a PCN reaction that required hospitalization: UNK Has patient had a PCN reaction occurring within the last 10 years: NO If all of the above answers  are "NO", then may proceed with Cephalosporin use.      Outpatient Medications Prior to Visit  Medication Sig Dispense Refill   clindamycin (CLEOCIN) 300 MG capsule Take 1 capsule (300 mg total) by mouth 3 (three) times daily for 7 days. 21 capsule 0   bictegravir-emtricitabine-tenofovir AF (BIKTARVY) 50-200-25 MG TABS tablet Take 1 tablet by mouth daily. 30 tablet 5   megestrol (MEGACE) 40 MG/ML suspension Take 15.6 mLs (625 mg total) by mouth daily. (Patient not taking: Reported on 11/04/2022) 480 mL 1   Nutritional Supplements (ENSURE ACTIVE HIGH PROTEIN) LIQD Take 1 each by mouth in the morning and at bedtime. (Patient not taking: Reported on 11/04/2022) 474 mL 11   No facility-administered medications prior to visit.     Past Medical History:  Diagnosis Date   Floaters with photopsia    HIV (human immunodeficiency virus infection) (HCC)    Palpitations    Tobacco use      Past Surgical History:  Procedure Laterality Date   NO PAST SURGERIES        Review of Systems  Constitutional:  Negative for appetite change, chills, fatigue, fever and unexpected weight change.  Eyes:  Negative for visual disturbance.  Respiratory:  Negative for cough, chest tightness, shortness of breath and wheezing.   Cardiovascular:  Negative for chest pain and leg swelling.  Gastrointestinal:  Negative for abdominal pain, constipation, diarrhea, nausea and vomiting.  Genitourinary:  Negative for dysuria, flank pain, frequency, genital sores, hematuria and urgency.  Skin:  Negative for rash.  Allergic/Immunologic: Negative  for immunocompromised state.  Neurological:  Negative for dizziness and headaches.      Objective:    BP 108/79   Pulse 97   Resp 16   Ht 5\' 4"  (1.626 m)   Wt 127 lb (57.6 kg)   SpO2 98%   BMI 21.80 kg/m  Nursing note and vital signs reviewed.  Physical Exam Constitutional:      General: He is not in acute distress.    Appearance: He is well-developed.  Eyes:      Conjunctiva/sclera: Conjunctivae normal.  Cardiovascular:     Rate and Rhythm: Normal rate and regular rhythm.     Heart sounds: Normal heart sounds. No murmur heard.    No friction rub. No gallop.  Pulmonary:     Effort: Pulmonary effort is normal. No respiratory distress.     Breath sounds: Normal breath sounds. No wheezing or rales.  Chest:     Chest wall: No tenderness.  Abdominal:     General: Bowel sounds are normal.     Palpations: Abdomen is soft.     Tenderness: There is no abdominal tenderness.  Musculoskeletal:     Cervical back: Neck supple.  Lymphadenopathy:     Cervical: No cervical adenopathy.  Skin:    General: Skin is warm and dry.     Findings: No rash.  Neurological:     Mental Status: He is alert and oriented to person, place, and time.  Psychiatric:        Behavior: Behavior normal.        Thought Content: Thought content normal.        Judgment: Judgment normal.         11/04/2022   10:40 AM 01/13/2022    2:36 PM 09/17/2021   10:18 AM 01/14/2021    2:06 PM 11/21/2020   10:36 AM  Depression screen PHQ 2/9  Decreased Interest 0 2 0 3 0  Down, Depressed, Hopeless 0 1 0 1 0  PHQ - 2 Score 0 3 0 4 0  Altered sleeping  2  3 3   Tired, decreased energy  2  3 1   Change in appetite  3  1 2   Feeling bad or failure about yourself   1  3 0  Trouble concentrating  0  1 2  Moving slowly or fidgety/restless  3  3 3   Suicidal thoughts  0  0 0  PHQ-9 Score  14  18 11   Difficult doing work/chores  Somewhat difficult  Somewhat difficult Not difficult at all       Assessment & Plan:    Patient Active Problem List   Diagnosis Date Noted   Palpitations 01/13/2022   Floaters with photopsia 01/13/2022   Therapeutic drug monitoring 09/17/2021   GAD (generalized anxiety disorder) 11/22/2020   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 Rodney Chase has generally well-controlled virus with good adherence and tolerance to Biktarvy, however has been off medication for a week secondary to financial/pharmacy coverage.  Reviewed previous lab work and discussed plan of care.  Meet with financial assistance to determine medication affordability.  Check blood work.  Continue current dose of Biktarvy.  Plan for follow-up in 4 months or sooner if needed with lab work  on the same day.      Relevant Medications   bictegravir-emtricitabine-tenofovir AF (BIKTARVY) 50-200-25 MG TABS tablet   Other Relevant Orders   HIV-1 RNA quant-no reflex-bld   T-helper cell (CD4)- ([facility] clinic only)   BASIC METABOLIC PANEL WITH GFR   Healthcare maintenance    Discussed importance of safe sexual practice and condom use. Condoms and STD testing offered.  Declines vaccinations Due for dental care and was dismissed from [facility] dental clinic and will need to find alternative option. Referral placed for colon cancer screening.       Other Visit Diagnoses     At risk for colon cancer       Relevant Orders   Ambulatory referral to Gastroenterology   Screening for STDs (sexually transmitted diseases)       Relevant Orders   RPR        I am having [name] maintain his Ensure Active High Protein, megestrol, clindamycin, and Biktarvy.   Meds ordered this encounter  Medications   bictegravir-emtricitabine-tenofovir AF (BIKTARVY) 50-200-25 MG TABS tablet    Sig: Take 1 tablet by mouth daily.    Dispense:  30 tablet    Refill:  5    Order Specific Question:   Supervising Provider    Answer:   Judyann Munson [[id number]]     Follow-up: Return in about 4 months (around 03/06/2023), or if symptoms worsen or fail to improve.   Marcos Eke, MSN, FNP-C Nurse Practitioner [facility] Main number:  [phone]

## 2022-11-04 NOTE — Assessment & Plan Note (Signed)
Rodney Chase has generally well-controlled virus with good adherence and tolerance to Biktarvy, however has been off medication for a week secondary to financial/pharmacy coverage.  Reviewed previous lab work and discussed plan of care.  Meet with financial assistance to determine medication affordability.  Check blood work.  Continue current dose of Biktarvy.  Plan for follow-up in 4 months or sooner if needed with lab work on the same day.

## 2022-11-04 NOTE — Patient Instructions (Addendum)
Nice to see you.  We will check your lab work today.  Continue to take your medication daily as prescribed.  Refills have been sent to the pharmacy.  Plan for follow up in 4 months or sooner if needed with lab work on the same day.  Have a great day and stay safe!  

## 2022-11-04 NOTE — Assessment & Plan Note (Signed)
Discussed importance of safe sexual practice and condom use. Condoms and STD testing offered.  Declines vaccinations Due for dental care and was dismissed from [facility] dental clinic and will need to find alternative option. Referral placed for colon cancer screening.

## 2022-11-04 NOTE — Telephone Encounter (Signed)
Received voicemail from patient stating he's been unable to get his Biktarvy from the pharmacy. Appears he is due for refills. [facility], he confirms he will be in for his appointment this morning. Refills can be ordered during visit. He reports issues with medication copay, will have him see financial while he's in the office.   Sandie Ano, RN

## 2022-11-05 ENCOUNTER — Other Ambulatory Visit: Payer: Self-pay | Admitting: Pharmacist

## 2022-11-05 DIAGNOSIS — B20 Human immunodeficiency virus [HIV] disease: Secondary | ICD-10-CM

## 2022-11-05 LAB — T-HELPER CELL (CD4) - (RCID CLINIC ONLY)
CD4 % Helper T Cell: 35 % (ref 33–65)
CD4 T Cell Abs: 825 /uL (ref 400–1790)

## 2022-11-05 MED ORDER — BIKTARVY 50-200-25 MG PO TABS: 1.0000 | ORAL_TABLET | Freq: Every day | ORAL | Status: AC

## 2022-11-05 NOTE — Progress Notes (Signed)
 Medication Samples have been provided to the patient.  Drug name: Biktarvy        Strength: 50/200/25 mg       Qty: 1 bottle (7 tablets)   LOT: [id number]   Exp.Date: 12/2024  Dosing instructions: Take one tablet by mouth once daily  The patient has been instructed regarding the correct time, dose, and frequency of taking this medication, including desired effects and most common side effects.   Shamere Campas L. Jannette Fogo, PharmD, BCIDP, AAHIVP, CPP Clinical Pharmacist Practitioner Infectious Diseases Clinical Pharmacist [facility] 02/13/2020, 10:07 AM

## 2022-11-06 LAB — BASIC METABOLIC PANEL WITH GFR
BUN: 13 mg/dL (ref 7–25)
CO2: 27 mmol/L (ref 20–32)
Calcium: 9.3 mg/dL (ref 8.6–10.3)
Chloride: 105 mmol/L (ref 98–110)
Creat: 0.94 mg/dL (ref 0.60–1.29)
Glucose, Bld: 92 mg/dL (ref 65–139)
Potassium: 4.5 mmol/L (ref 3.5–5.3)
Sodium: 139 mmol/L (ref 135–146)
eGFR: 101 mL/min/{1.73_m2} (ref >=60)

## 2022-11-06 LAB — HIV-1 RNA QUANT-NO REFLEX-BLD
HIV 1 RNA Quant: NOT DETECTED {copies}/mL
HIV-1 RNA Quant, Log: NOT DETECTED {Log_copies}/mL

## 2022-11-06 LAB — RPR: RPR Ser Ql: NONREACTIVE

## 2022-11-17 ENCOUNTER — Encounter: Payer: Self-pay | Admitting: Gastroenterology

## 2022-12-04 ENCOUNTER — Ambulatory Visit: Payer: BLUE CROSS/BLUE SHIELD | Attending: Internal Medicine | Admitting: Internal Medicine

## 2022-12-05 ENCOUNTER — Ambulatory Visit: Payer: BLUE CROSS/BLUE SHIELD

## 2022-12-05 ENCOUNTER — Telehealth: Payer: Self-pay | Admitting: *Deleted

## 2022-12-05 ENCOUNTER — Encounter: Payer: Self-pay | Admitting: Internal Medicine

## 2022-12-05 NOTE — Telephone Encounter (Signed)
Attempt to reach pt for pre-visit. LM with call back #. Will attempt to reach again in  5 min due to no other # listed in profile

## 2022-12-05 NOTE — Telephone Encounter (Signed)
Spoke with pt and started PV during this pt states he has had frequent episodes where he just "passes out". It can come from exertion and from just walking from the couch to kitchen. He says he is supposed to receive a referral with a Cardiologist. Pt denies any GI issues to have colon that is scheduled and he desires to cancel colon and reschedule after he sees a Development worker, international aid,

## 2022-12-13 IMAGING — CT CT ABD-PELV W/ CM
2 of 5 series · 16 of 46 positions shown, 18 images · IV contrast (Omnipaque)
Comparison: None.

CLINICAL DATA: Acute abdominal pain and left flank pain for 2
weeks.

EXAM:
CT ABDOMEN AND PELVIS WITH CONTRAST
TECHNIQUE: Multidetector CT imaging of the abdomen and pelvis was performed
using the standard protocol following bolus administration of
intravenous contrast.
CONTRAST:  100mL OMNIPAQUE IOHEXOL 300 MG/ML  SOLN

[Series 2: axial st · axial · 0.73mm/px · z∈[+780,+1135]mm · 13 of 81 slices shown, 15 images]
[im 5/81  soft-tissue]
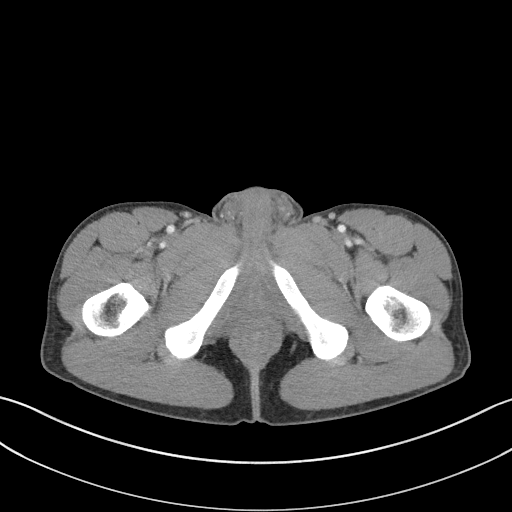
[im 5/81  bone]
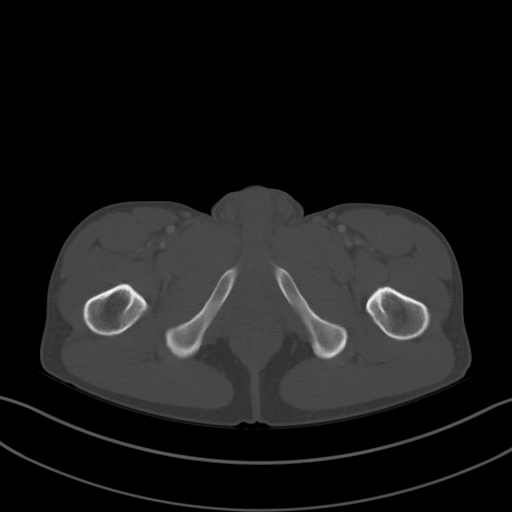
[im 13/81  soft-tissue]
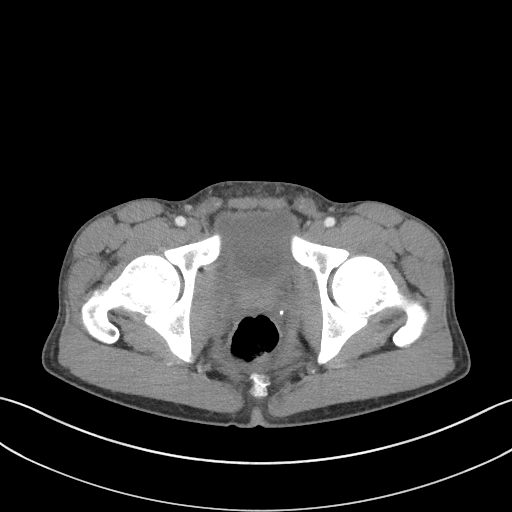
[im 17/81  soft-tissue]
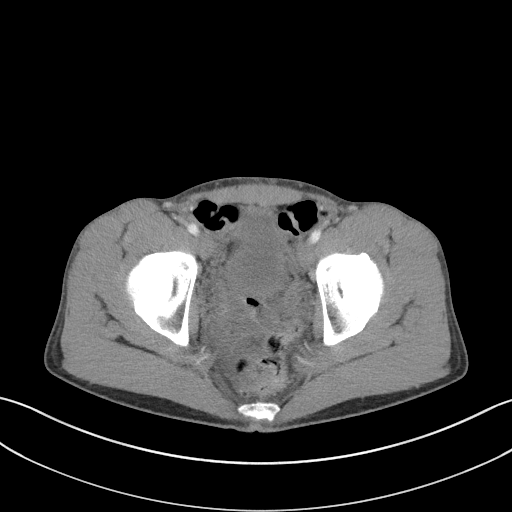
[im 22/81  soft-tissue]
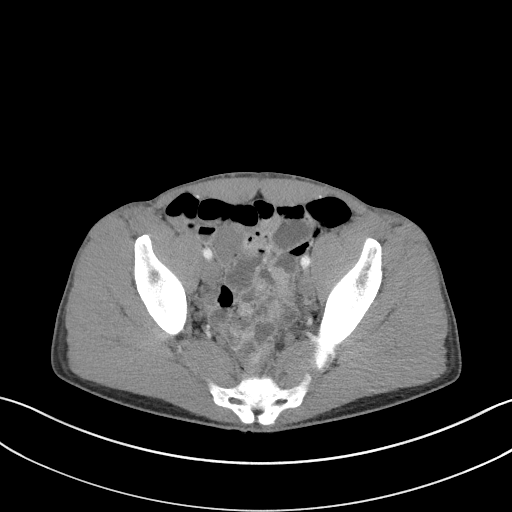
[im 30/81  soft-tissue]
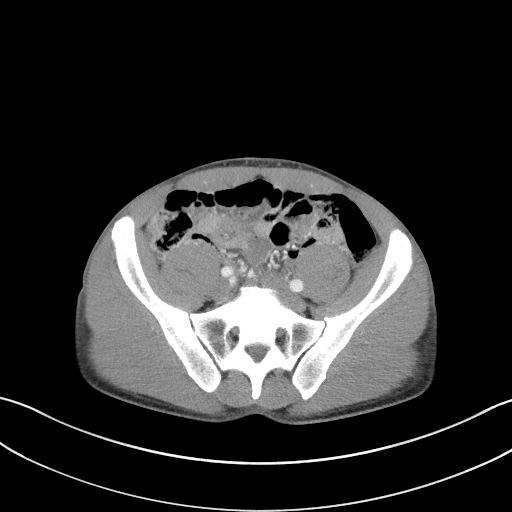
[im 34/81  soft-tissue]
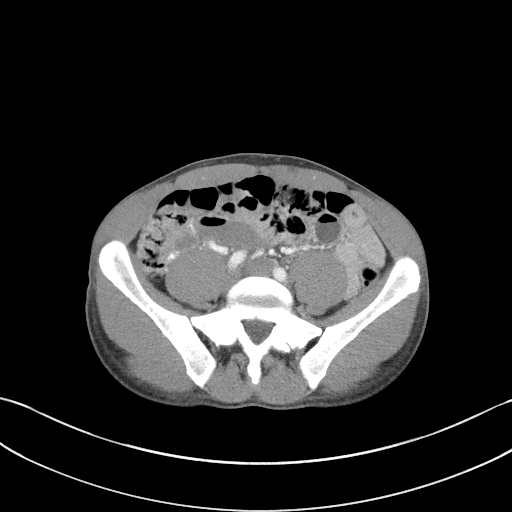
[im 43/81  soft-tissue]
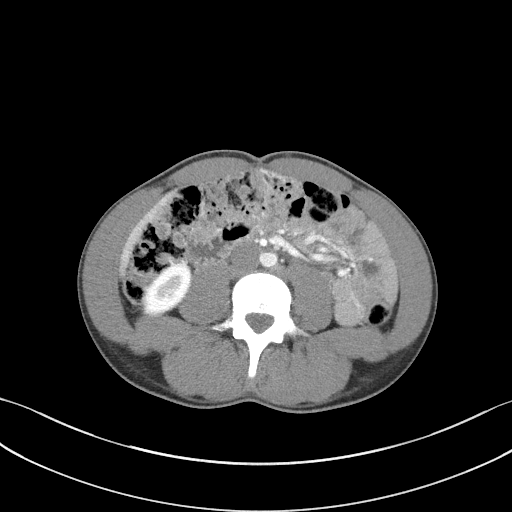
[im 47/81  soft-tissue]
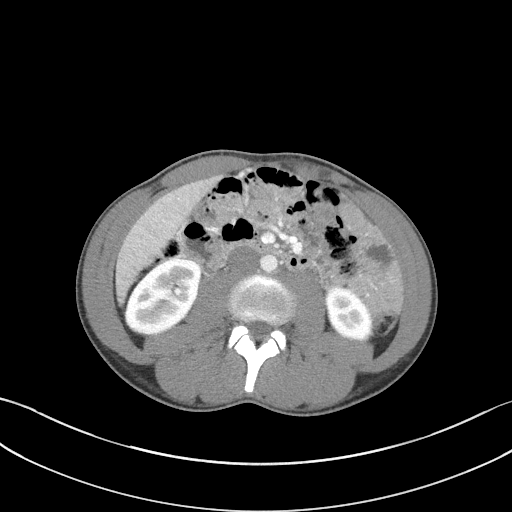
[im 51/81  soft-tissue]
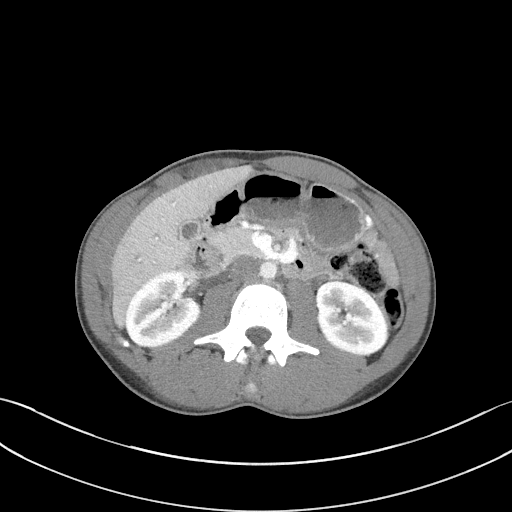
[im 51/81  bone]
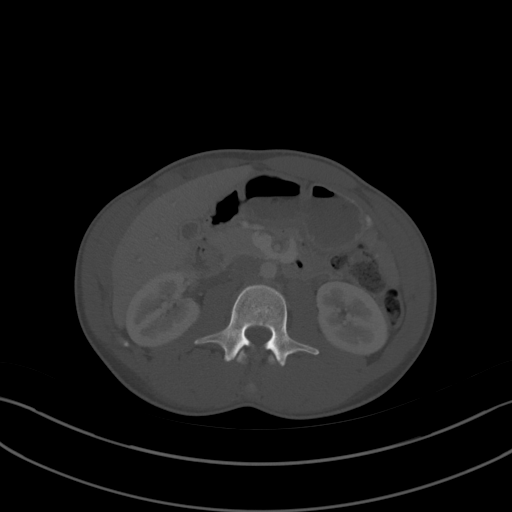
[im 59/81  soft-tissue]
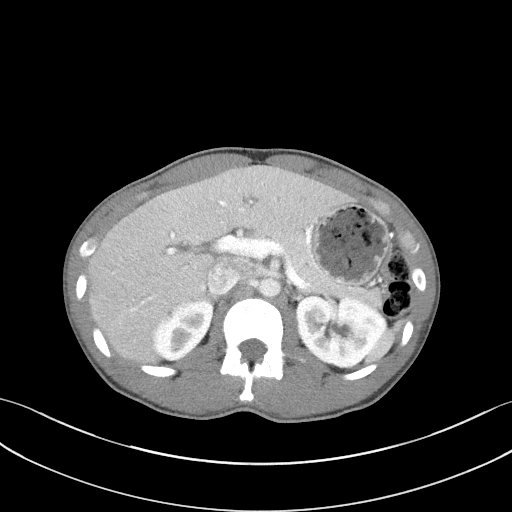
[im 64/81  soft-tissue]
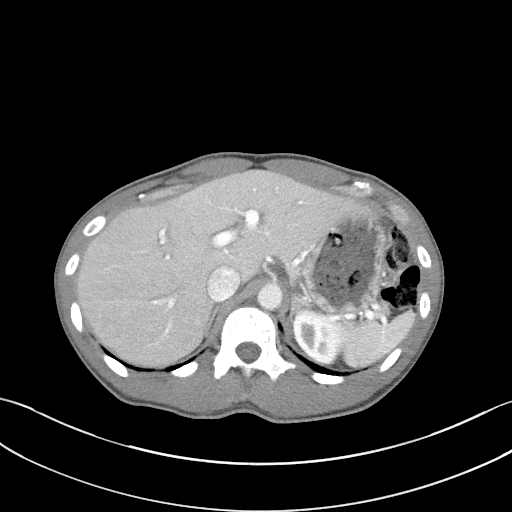
[im 68/81  soft-tissue]
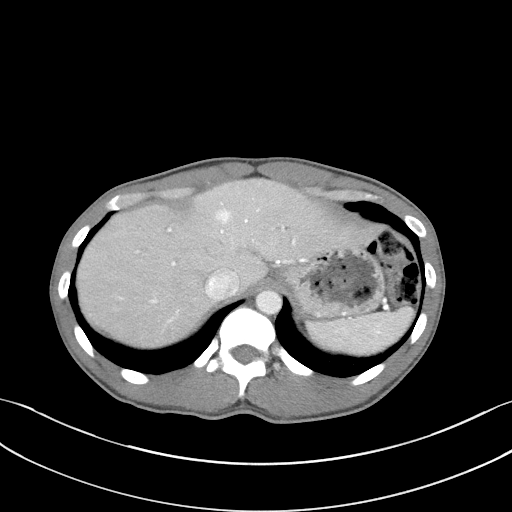
[im 76/81  soft-tissue]
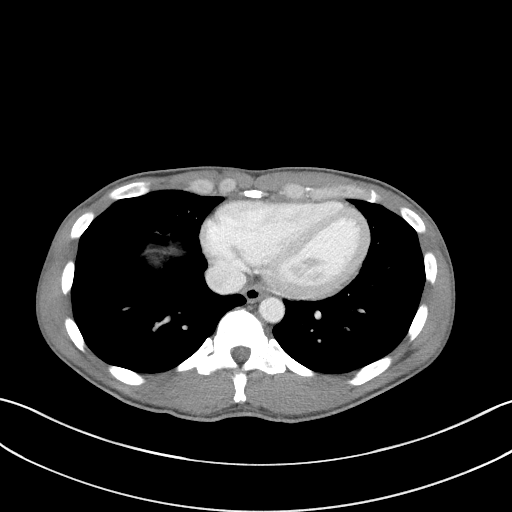

[Series 5: coronal st · coronal · 0.71mm/px · 3 of 72 slices shown]
[im 24/72  soft-tissue]
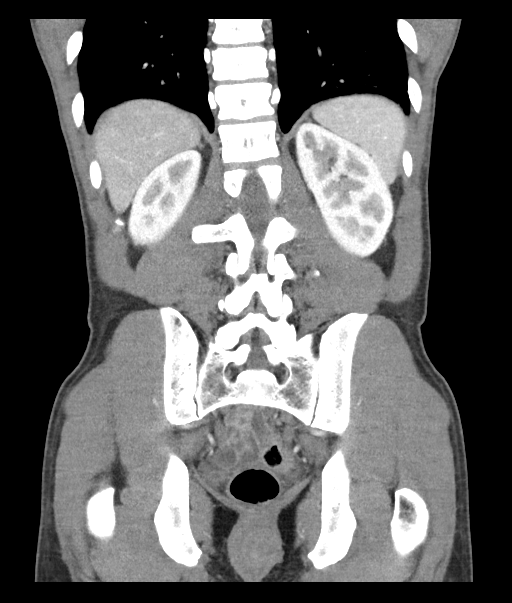
[im 32/72  soft-tissue]
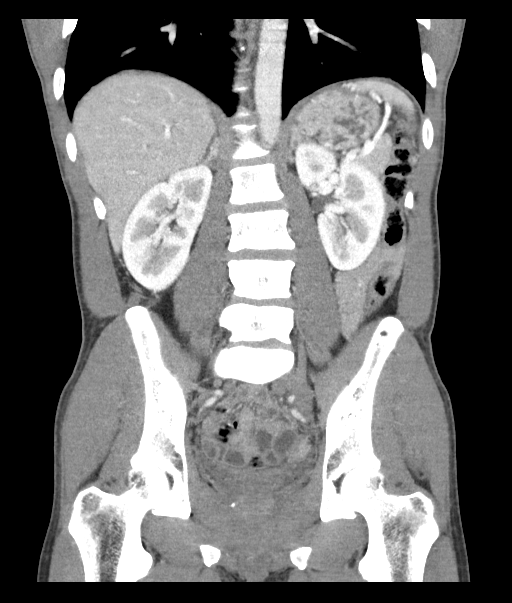
[im 40/72  soft-tissue]
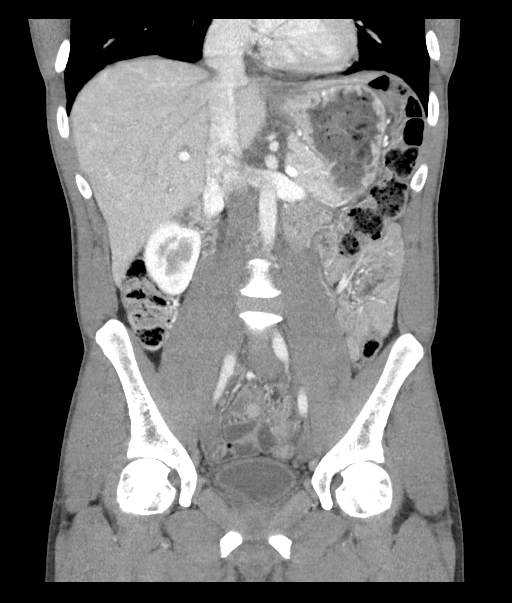

[16 of 46 positions shown; findings below may reference images not displayed]

FINDINGS: Lower chest: Minimal left basilar dependent atelectasis.

Hepatobiliary: There is mild periportal edema. No focal liver
abnormality is seen. No gallstones, gallbladder wall thickening, or
biliary dilatation.

Pancreas: Unremarkable. No pancreatic ductal dilatation or
surrounding inflammatory changes.

Spleen: Normal in size without focal abnormality.

Adrenals/Urinary Tract: Adrenal glands are unremarkable. Kidneys are
normal, without renal calculi, focal lesion, or hydronephrosis.
Bladder is unremarkable.

Stomach/Bowel: Stomach is within normal limits. No evidence of bowel
wall thickening, distention, or inflammatory changes.

Vascular/Lymphatic: No significant vascular finding is identified.
No enlarged abdominal or pelvic lymph nodes.

Reproductive: Prostate is unremarkable.

Other: No abdominal wall hernia or abnormality. No abdominopelvic
ascites.

Musculoskeletal: No acute or significant osseous findings.
IMPRESSION: Mild periportal edema. Otherwise, no findings to explain the
patient's symptoms.

## 2022-12-24 ENCOUNTER — Telehealth: Payer: Self-pay

## 2022-12-24 ENCOUNTER — Encounter: Payer: BLUE CROSS/BLUE SHIELD | Admitting: Gastroenterology

## 2022-12-24 NOTE — Telephone Encounter (Signed)
Patient called office for number to Internal Medicine and Cardiology. States that his appointment with GI was canceled and was advised to see cardiology first.  Patient also c/o constantly feeling cold and having headaches. Feels worse at night. Requested patient contact PCP to schedule appointment for further work up and for labs. Patient is concerned iron might be low. Will schedule appt with PCP today.   Juanita Laster, RMA

## 2022-12-24 NOTE — Telephone Encounter (Signed)
Patient called back stating that he is unable to get in with PCP until mid November. Advised patient he could go to [facility] or ED. Patient agreeable to go to ED in [place].    Cameron Schwinn Lesli Albee, CMA

## 2023-01-19 ENCOUNTER — Ambulatory Visit: Payer: BLUE CROSS/BLUE SHIELD | Admitting: Student

## 2023-01-19 ENCOUNTER — Encounter: Payer: Self-pay | Admitting: Student

## 2023-01-19 VITALS — BP 121/77 | HR 79 | Temp 98.3°F | Ht 64.0 in | Wt 168.4 lb

## 2023-01-19 DIAGNOSIS — R21 Rash and other nonspecific skin eruption: Secondary | ICD-10-CM

## 2023-01-19 DIAGNOSIS — Z1331 Encounter for screening for depression: Secondary | ICD-10-CM

## 2023-01-19 DIAGNOSIS — R002 Palpitations: Secondary | ICD-10-CM

## 2023-01-19 DIAGNOSIS — F411 Generalized anxiety disorder: Secondary | ICD-10-CM

## 2023-01-19 DIAGNOSIS — B20 Human immunodeficiency virus [HIV] disease: Secondary | ICD-10-CM | POA: Diagnosis not present

## 2023-01-19 DIAGNOSIS — B888 Other specified infestations: Secondary | ICD-10-CM

## 2023-01-19 MED ORDER — TRIAMCINOLONE ACETONIDE 0.1 % EX OINT: 1.0000 | TOPICAL_OINTMENT | Freq: Two times a day (BID) | CUTANEOUS | 0 refills | Status: DC

## 2023-01-19 NOTE — Assessment & Plan Note (Signed)
Currently not on any medication. May benefit from Sertaline in the future. Uses marijuana denies any other recreational drug use. Has tried Cocaine (snorted) in the past, just a couple times, and that was several years ago.

## 2023-01-19 NOTE — Assessment & Plan Note (Signed)
Follows with ID and has an appointment in January. Saw them in September. States he has had no problems with his Biktarvy and that he is taking it. Undetected levels of HIV RNA back then. Next appt is on 03/05/2023.

## 2023-01-19 NOTE — Assessment & Plan Note (Addendum)
He watches an older gentleman. He has the same rash. Rash started about a few days ago although he is unsure of the timing. It is spreading. Started on the arm but now on both arms, belly button, chest, and face. It is itchy. It starts as a small papule and then it scabs over after he scratches. Tried hydrocortisone and it did not work. Denies any fevers. Due to pattern of rash he may be having bed bugs alternatively this could be scabies or impetigo.   -Triamcinolone ointment  -FU in one month to ensure resolution.

## 2023-01-19 NOTE — Assessment & Plan Note (Addendum)
Has no showed to Cardiology appointments 3 times. Needed to follow with GI for a colonoscopy and was told that he needed to see Cardiology first because of his heart. Feels that heart is irregular. Walks short of breath. Has had near syncopal events and feels lightheaded. The room does not spin. Feels like his heart is racing. His anxiety has been worse lately. Denies weight loss, diarrhea, or fevers. No FH of thyroid disease in the family.  Denies other drug use besides marijuana. Did snort cocaine socially a couple times but a long time ago. May be related to his thyroid. EKGs in the ED in the past reportedly negative.  -TSH at next visit.  -FU in 1 month, EKG -Referral to Cardiology as he may benefit from a heart monitor

## 2023-01-19 NOTE — Progress Notes (Signed)
CC:  Chief Complaint  Patient presents with   Rash   HPI:  [name] is a 47 y.o. male living with a history stated below and presents today for a rash on his hands . Please see problem based assessment and plan for additional details.  Past Medical History:  Diagnosis Date   Floaters with photopsia    HIV (human immunodeficiency virus infection) (HCC)    Palpitations    Tobacco use     Current Outpatient Medications on File Prior to Visit  Medication Sig Dispense Refill   bictegravir-emtricitabine-tenofovir AF (BIKTARVY) 50-200-25 MG TABS tablet Take 1 tablet by mouth daily. 30 tablet 5   Cholecalciferol (VITAMIN D-3) 25 MCG (1000 UT) CAPS Take by mouth.     megestrol (MEGACE) 40 MG/ML suspension Take 15.6 mLs (625 mg total) by mouth daily. 480 mL 1   Nutritional Supplements (ENSURE ACTIVE HIGH PROTEIN) LIQD Take 1 each by mouth in the morning and at bedtime. 474 mL 11   No current facility-administered medications on file prior to visit.    Family History  Problem Relation Age of Onset   Breast cancer Mother        breast   Prostate cancer Father    Cancer Maternal Grandmother    Diabetes Paternal Grandmother    Diabetes Daughter    Lung cancer Maternal Aunt     Social History   Socioeconomic History   Marital status: Single    Spouse name: Not on file   Number of children: Not on file   Years of education: Not on file   Highest education level: Not on file  Occupational History   Not on file  Tobacco Use   Smoking status: Every Day    Current packs/day: 0.30    Types: Cigarettes   Smokeless tobacco: Never  Vaping Use   Vaping status: Never Used  Substance and Sexual Activity   Alcohol use: Yes    Comment: occasionally   Drug use: Yes    Types: Marijuana    Comment: daily   Sexual activity: Not Currently    Partners: Male  Other Topics Concern   Not on file  Social History Narrative   Lives with roommate   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  Vitals:   01/19/23 1420  BP: 121/77  Pulse: 79  Temp: 98.3 F (36.8 C)  TempSrc: Oral  SpO2: 100%  Weight: 168 lb 6.4 oz (76.4 kg)  Height: 5\' 4"  (1.626 m)    Physical Exam: Constitutional: well-appearing sitting in the chair, in no acute distress HENT: normocephalic atraumatic, mucous membranes moist Eyes: conjunctiva non-erythematous Cardiovascular: regular rate and rhythm, no m/r/g Pulmonary/Chest: normal work of breathing on room air, lungs clear to auscultation bilaterally Abdominal: soft, non-tender, non-distended MSK: normal bulk and tone Neurological: alert & oriented x 3, no focal deficit Skin: warm and dry, has papules over the bilateral arms, navel, back, and face some with excoriated serosanguinous crust.  Psych: normal mood and behavior  Assessment &  Plan:    Patient seen with Dr. Lafonda Mosses  Rash He watches an older gentleman. He has the same rash. Rash started about a few days ago although he is unsure of the timing. It is spreading. Started on the arm but now on both arms, belly button, chest, and face. It is itchy. It starts as a small papule and then it scabs over after he scratches. Tried hydrocortisone and it did not work. Denies any fevers. Due to pattern of rash he may be having bed bugs alternatively this could be scabies or impetigo.   -Triamcinolone ointment  -FU in one month to ensure resolution.   Human immunodeficiency virus (HIV) disease (HCC) Follows with ID and has an appointment in January. Saw them in September.  States he has had no problems with his Biktarvy and that he is taking it. Undetected levels of HIV RNA back then. Next appt is on 03/05/2023.   Palpitations Has no showed to Cardiology appointments 3 times. Needed to follow with GI for a colonoscopy and was told that he needed to see Cardiology first because of his heart. Feels that heart is irregular. Walks short of breath. Has had near syncopal events and feels lightheaded. The room does not spin. Feels like his heart is racing. His anxiety has been worse lately. Denies weight loss, diarrhea, or fevers. No FH of thyroid disease in the family.  Denies other drug use besides marijuana. Did snort cocaine socially a couple times but a long time ago. May be related to his thyroid. EKGs in the ED in the past reportedly negative.  -TSH at next visit.  -FU in 1 month, EKG -Referral to Cardiology as he may benefit from a heart monitor  GAD (generalized anxiety disorder) Currently not on any medication. May benefit from Sertaline in the future. Uses marijuana denies any other recreational drug use. Has tried Cocaine (snorted) in the past, just a couple times, and that was several years ago.   Screening for depression PHQ 9 is an 8 today. Is teary eyed when he speaks. Has not had time for himself. He has been working a lot and finds it difficult to do the things he enjoys. His appetite is always off- tried megace but he was concerned about the side effects so he stopped about a month ago. He does have trouble concentrating and has racing thoughts at night. Denies SI. Has GAD.  -May benefit from sertraline in the future.  -CTM   Manuela Neptune, MD [facility] Internal Medicine, PGY-1 Phone: [phone] Date 01/19/2023 Time 3:44 PM

## 2023-01-19 NOTE — Progress Notes (Deleted)
CC: ***  HPI:  Rodney Chase is a 47 y.o. male living with a history stated below and presents today for ***. Please see problem based assessment and plan for additional details.  Past Medical History:  Diagnosis Date   Floaters with photopsia    HIV (human immunodeficiency virus infection) (HCC)    Palpitations    Tobacco use     Current Outpatient Medications on File Prior to Visit  Medication Sig Dispense Refill   bictegravir-emtricitabine-tenofovir AF (BIKTARVY) 50-200-25 MG TABS tablet Take 1 tablet by mouth daily. 30 tablet 5   Cholecalciferol (VITAMIN D-3) 25 MCG (1000 UT) CAPS Take by mouth.     megestrol (MEGACE) 40 MG/ML suspension Take 15.6 mLs (625 mg total) by mouth daily. 480 mL 1   Nutritional Supplements (ENSURE ACTIVE HIGH PROTEIN) LIQD Take 1 each by mouth in the morning and at bedtime. 474 mL 11   No current facility-administered medications on file prior to visit.    Family History  Problem Relation Age of Onset   Diabetes Daughter    Breast cancer Mother        breast   Cancer Maternal Grandmother    Diabetes Paternal Grandmother    Lung cancer Maternal Aunt     Social History   Socioeconomic History   Marital status: Single    Spouse name: Not on file   Number of children: Not on file   Years of education: Not on file   Highest education level: Not on file  Occupational History   Not on file  Tobacco Use   Smoking status: Every Day    Current packs/day: 0.30    Types: Cigarettes   Smokeless tobacco: Never  Vaping Use   Vaping status: Never Used  Substance and Sexual Activity   Alcohol use: Yes    Comment: occasionally   Drug use: Yes    Types: Marijuana    Comment: daily   Sexual activity: Not Currently    Partners: Male  Other Topics Concern   Not on file  Social History Narrative   Lives with roommate   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  Vitals:   01/19/23 1420  BP: 121/77  Pulse: 79  Temp: 98.3 F (36.8 C)  TempSrc: Oral  SpO2: 100%  Weight: 168 lb 6.4 oz (76.4 kg)  Height: 5\' 4"  (1.626 m)    Physical Exam: Constitutional: well-appearing *** sitting in ***, in no acute distress HENT: normocephalic atraumatic, mucous membranes moist Eyes: conjunctiva non-erythematous Cardiovascular: regular rate and rhythm, no m/r/g Pulmonary/Chest: normal work of breathing on room air, lungs clear to auscultation bilaterally Abdominal: soft, non-tender, non-distended MSK: normal bulk and tone Neurological: alert & oriented x 3, no focal deficit Skin: warm and dry Psych: normal mood and behavior  Assessment & Plan:     Patient {GC/GE:[id number]::"discussed with","seen with"} Dr. {[id number]::"OHYWVPXT","G. Hoffman","Mullen","Narendra","Vincent","Guilloud","Lau","Machen"}  No problem-specific Assessment & Plan notes found for this encounter.   Manuela Neptune, MD [facility] Internal Medicine, PGY-1 Phone: [phone] Date  01/19/2023 Time 2:26 PM

## 2023-01-19 NOTE — Assessment & Plan Note (Addendum)
PHQ 9 is an 8 today. Is teary eyed when he speaks. Has not had time for himself. He has been working a lot and finds it difficult to do the things he enjoys. His appetite is always off- tried megace but he was concerned about the side effects so he stopped about a month ago. He does have trouble concentrating and has racing thoughts at night. Denies SI. Has GAD.  -May benefit from sertraline in the future.  -CTM

## 2023-01-19 NOTE — Patient Instructions (Addendum)
Thank you, Mr.Isay Hilleary for allowing [place] to provide your care today.     You were seen today because of concerns for a rash. We discussed that this is potentially scabies. Please make sure to use the following.   Start the following medications: Meds ordered this encounter  Medications   triamcinolone ointment (KENALOG) 0.1 %    Sig: Apply 1 Application topically 2 (two) times daily.    Dispense:  30 g    Refill:  0     Below is a link to a website that talks about scabies and how to get rid of it. You will need to have these exterminated.  ExcitingPage.co.za  You were also seen because you need a referral to Cardiology. I will send a referral today, but you will most likely need to call given the no-show appointments you have had.   [facility] [address] [place], [place]  [address] Phone:  [phone]   Fax:  [phone]  Follow up:  1 month    Should you have any questions or concerns please call the internal medicine clinic at [phone].     Manuela Neptune, MD [facility]

## 2023-01-20 NOTE — Progress Notes (Signed)
Internal Medicine Clinic Attending  I was physically present during the key portions of the resident provided service and participated in the medical decision making of patient's management care. I reviewed pertinent patient test results.  The assessment, diagnosis, and plan were formulated together and I agree with the documentation in the resident's note.  Mercie Eon, MD    Patient has an acute intensely itchy rash over his exposed skin (arms, neck, back) with small erythematous itchy papules. The older man who lives with him has similar symptoms. I am worried this could be bed bugs - we will prescribe a triamcinolone cream and I strongly encouraged him to call an exterminator to rid the house of potential beg bugs.  I did not see any burrows or lesions that look like scabies, but if this itching persists, keep this in ddx.

## 2023-01-22 ENCOUNTER — Telehealth: Payer: Self-pay | Admitting: *Deleted

## 2023-01-22 NOTE — Telephone Encounter (Signed)
Call from pt c/o neck and spine pain. Stated he was seen on 11/18 and mentioned this to the doctor but they were more concerned about the rash. Stated sometimes when he coughs, his neck hurts. Stated this started about 2 weeks ago. Also it's uncomfortable to lie down. He has taken Motrin but stated he wants to know the cause.

## 2023-01-26 NOTE — Telephone Encounter (Signed)
Next appt scheduled 02/18/23.

## 2023-02-18 ENCOUNTER — Encounter: Payer: BLUE CROSS/BLUE SHIELD | Admitting: Student

## 2023-02-18 NOTE — Progress Notes (Deleted)
Established Patient Office Visit  Subjective   Patient ID: Rodney Chase, male    DOB: [birth year]  Age: 47 y.o. MRN: [id number]  No chief complaint on file.   HPI  This is a 47 year old male living with a history stated below and presents today for one month follow up. Please see problem based assessment and plan for additional details.  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   Healthcare maintenance 06/21/2019   Human immunodeficiency virus (HIV) disease (HCC) 03/27/2006   Past Medical History:  Diagnosis Date   Floaters with photopsia    HIV (human immunodeficiency virus infection) (HCC)    Palpitations    Tobacco use    Past Surgical History:  Procedure Laterality Date   NO PAST SURGERIES     Social History   Tobacco Use   Smoking status: Every Day    Current packs/day: 0.30    Types: Cigarettes   Smokeless tobacco: Never  Vaping Use   Vaping status: Never Used  Substance Use Topics   Alcohol use: Yes    Comment: occasionally   Drug use: Yes    Types: Marijuana    Comment: daily   Family Status  Relation Name Status   Mother  Deceased   Father  (Not Specified)   MGM  Deceased   PGM  Deceased   Daughter  (Not Specified)   Mat Aunt  Alive  No partnership data on file   Family History  Problem Relation Age of Onset   Breast cancer Mother        breast   Prostate cancer Father    Cancer Maternal Grandmother    Diabetes Paternal Grandmother    Diabetes Daughter    Lung cancer Maternal Aunt    Allergies  Allergen Reactions   Penicillins     Has patient had a PCN reaction causing immediate rash, facial/tongue/throat swelling, SOB or lightheadedness with hypotension: YES Has patient had a PCN  reaction causing severe rash involving mucus membranes or skin necrosis: NO Has patient had a PCN reaction that required hospitalization: UNK Has patient had a PCN reaction occurring within the last 10 years: NO If all of the above answers are "NO", then may proceed with Cephalosporin use.    ROS ROS negative except for what is noted on the assessment and plan.    Objective:     There were no vitals taken for this visit. BP Readings from Last 3 Encounters:  01/19/23 121/77  11/04/22 108/79  10/28/22 (!) 130/95   Wt Readings from Last 3 Encounters:  01/19/23 168 lb 6.4 oz (76.4 kg)  12/05/22 127 lb (57.6 kg)  11/04/22 127 lb (57.6 kg)      Physical Exam  General: Cardiovascular: Pulmonary: Abdomen: MSK: Neuro:  No results found for any visits on 02/18/23.  Last CBC Lab Results  Component Value Date   WBC 5.3 07/08/2020   HGB 14.0 07/08/2020   HCT 42.4 07/08/2020   MCV 86.2 07/08/2020   MCH 28.5 07/08/2020   RDW 14.1 07/08/2020   PLT 187 07/08/2020   Last metabolic panel Lab Results  Component Value Date   GLUCOSE 92 11/04/2022   NA 139 11/04/2022   K 4.5 11/04/2022  CL 105 11/04/2022   CO2 27 11/04/2022   BUN 13 11/04/2022   CREATININE 0.94 11/04/2022   EGFR 101 11/04/2022   CALCIUM 9.3 11/04/2022   PROT 6.2 06/24/2022   ALBUMIN 3.7 07/08/2020   BILITOT 0.5 06/24/2022   ALKPHOS 39 07/08/2020   AST 15 06/24/2022   ALT 8 (L) 06/24/2022   ANIONGAP 7 07/08/2020   Last lipids Lab Results  Component Value Date   CHOL 164 05/23/2021   HDL 54 05/23/2021   LDLCALC 95 05/23/2021   TRIG 61 05/23/2021   CHOLHDL 3.0 05/23/2021   Last hemoglobin A1c Lab Results  Component Value Date   HGBA1C 5.6 03/05/2018      The 10-year ASCVD risk score (Arnett DK, et al., 2019) is: 6.2%    Assessment & Plan:  Neck Pain    Pruritic Rash Patient was prescribed Triamcinolone ointment per the last visit. 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 Will consider bed bugs in differential.   HIV Patient follows infectious disease and has an appointment in January.  Patient is taking Biktarvy without any questions or concerns at this time.   Palpitations Patient with previous symptoms of shortness of breath upon exertion and near syncopal episodes with a lightheadedness.  Does attribute it to his anxiety. Cardiology referral per last visit, appointment scheduled on 03/10/2023. NO TSH or EKG ordered per the last visit, consider in this visit.   Depression/Anxiety  GAD 7 = , PHQ 9 = ; Consider starting sertraline.    Problem List Items Addressed This Visit   None   No follow-ups on file.    Jeral Pinch, DO

## 2023-02-18 NOTE — Progress Notes (Deleted)
Established Patient Office Visit  Subjective   Patient ID: Rodney Chase, male    DOB: 15-Jan-1976  Age: 47 y.o. MRN: [id number]  No chief complaint on file.   HPI This is a 47 year old male living with a history stated below and presents today for one month follow up. Please see problem based assessment and plan for additional details.    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   Healthcare maintenance 06/21/2019   Human immunodeficiency virus (HIV) disease (HCC) 03/27/2006   Past Medical History:  Diagnosis Date   Floaters with photopsia    HIV (human immunodeficiency virus infection) (HCC)    Palpitations    Tobacco use    Past Surgical History:  Procedure Laterality Date   NO PAST SURGERIES     Social History   Tobacco Use   Smoking status: Every Day    Current packs/day: 0.30    Types: Cigarettes   Smokeless tobacco: Never  Vaping Use   Vaping status: Never Used  Substance Use Topics   Alcohol use: Yes    Comment: occasionally   Drug use: Yes    Types: Marijuana    Comment: daily   Family Status  Relation Name Status   Mother  Deceased   Father  (Not Specified)   MGM  Deceased   PGM  Deceased   Daughter  (Not Specified)   Mat Aunt  Alive  No partnership data on file   Family History  Problem Relation Age of Onset   Breast cancer Mother        breast   Prostate cancer Father    Cancer Maternal Grandmother    Diabetes Paternal Grandmother    Diabetes Daughter    Lung cancer Maternal Aunt    Allergies  Allergen Reactions   Penicillins     Has patient had a PCN reaction causing immediate rash, facial/tongue/throat swelling, SOB or lightheadedness with hypotension: YES Has patient had a PCN  reaction causing severe rash involving mucus membranes or skin necrosis: NO Has patient had a PCN reaction that required hospitalization: UNK Has patient had a PCN reaction occurring within the last 10 years: NO If all of the above answers are "NO", then may proceed with Cephalosporin use.    ROS  ROS negative except for what is noted on the assessment and plan.    Objective:     There were no vitals taken for this visit. BP Readings from Last 3 Encounters:  01/19/23 121/77  11/04/22 108/79  10/28/22 (!) 130/95   Wt Readings from Last 3 Encounters:  01/19/23 168 lb 6.4 oz (76.4 kg)  12/05/22 127 lb (57.6 kg)  11/04/22 127 lb (57.6 kg)   SpO2 Readings from Last 3 Encounters:  01/19/23 100%  11/04/22 98%  10/28/22 100%      Physical Exam   No results found for any visits on 02/18/23.  Last CBC Lab Results  Component Value Date   WBC 5.3 07/08/2020   HGB 14.0 07/08/2020   HCT 42.4 07/08/2020   MCV 86.2 07/08/2020   MCH 28.5 07/08/2020   RDW 14.1 07/08/2020   PLT 187 07/08/2020   Last metabolic panel Lab Results  Component Value Date   GLUCOSE 92  11/04/2022   NA 139 11/04/2022   K 4.5 11/04/2022   CL 105 11/04/2022   CO2 27 11/04/2022   BUN 13 11/04/2022   CREATININE 0.94 11/04/2022   EGFR 101 11/04/2022   CALCIUM 9.3 11/04/2022   PROT 6.2 06/24/2022   ALBUMIN 3.7 07/08/2020   BILITOT 0.5 06/24/2022   ALKPHOS 39 07/08/2020   AST 15 06/24/2022   ALT 8 (L) 06/24/2022   ANIONGAP 7 07/08/2020   Last lipids Lab Results  Component Value Date   CHOL 164 05/23/2021   HDL 54 05/23/2021   LDLCALC 95 05/23/2021   TRIG 61 05/23/2021   CHOLHDL 3.0 05/23/2021   Last hemoglobin A1c Lab Results  Component Value Date   HGBA1C 5.6 03/05/2018      The 10-year ASCVD risk score (Arnett DK, et al., 2019) is: 6.2%    Assessment & Plan:  Neck Pain    Pruritic Rash Patient was prescribed Triamcinolone ointment per the last visit. 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 Will consider bed bugs in differential.   HIV Patient follows infectious disease and has an appointment in January.  Patient is taking Biktarvy without any questions or concerns at this time.   Palpitations Patient with previous symptoms of shortness of breath upon exertion and near syncopal episodes with a lightheadedness.  Does attribute it to his anxiety. Cardiology referral per last visit, appointment scheduled on 03/10/2023. NO TSH or EKG ordered per the last visit, consider in this visit.   Depression/Anxiety  GAD 7 = , PHQ 9 = ; Consider starting sertraline.  Problem List Items Addressed This Visit   None   No follow-ups on file.    Jeral Pinch, DO

## 2023-03-02 ENCOUNTER — Encounter (HOSPITAL_COMMUNITY): Payer: Self-pay

## 2023-03-02 ENCOUNTER — Emergency Department (HOSPITAL_COMMUNITY)
Admission: EM | Admit: 2023-03-02 | Discharge: 2023-03-02 | Discharge status: Against Medical Advice | Payer: BLUE CROSS/BLUE SHIELD

## 2023-03-02 ENCOUNTER — Ambulatory Visit (HOSPITAL_COMMUNITY)
Admission: EM | Admit: 2023-03-02 | Discharge: 2023-03-02 | Disposition: A | Discharge status: Home / Self Care | Payer: BLUE CROSS/BLUE SHIELD | Attending: Internal Medicine | Admitting: Internal Medicine

## 2023-03-02 DIAGNOSIS — M549 Dorsalgia, unspecified: Secondary | ICD-10-CM

## 2023-03-02 MED ORDER — KETOROLAC TROMETHAMINE 30 MG/ML IJ SOLN
INTRAMUSCULAR | Status: AC
  Filled 2023-03-02: qty 1

## 2023-03-02 MED ORDER — TRAMADOL HCL 50 MG PO TABS: 50.0000 mg | ORAL_TABLET | Freq: Two times a day (BID) | ORAL | 0 refills | Status: DC | PRN

## 2023-03-02 MED ORDER — METHOCARBAMOL 500 MG PO TABS: 500.0000 mg | ORAL_TABLET | Freq: Every evening | ORAL | 0 refills | Status: DC | PRN

## 2023-03-02 MED ORDER — KETOROLAC TROMETHAMINE 30 MG/ML IJ SOLN
30.0000 mg | Freq: Once | INTRAMUSCULAR | Status: AC
  Administered 2023-03-02: 30 mg via INTRAMUSCULAR

## 2023-03-02 NOTE — ED Provider Notes (Signed)
[facility]    CSN: [id number] Arrival date & time: 03/02/23  0907      History   Chief Complaint Chief Complaint  Patient presents with   Back Pain   Chest Pain    HPI Rodney Chase is a 47 y.o. male comes to urgent care complaining of left upper back pain and left sided chest pain for few weeks duration.  Patient describes the pain as sharp and throbbing.  Pain is aggravated by movement and taking a deep breath.  Pain is also aggravated by coughing.  Pain initially started from the left side of the neck and extended to involve the left upper back and the left side of the chest.  Today the patient has pain with 8 out of 10 severity.  Pain is stabbing in nature and associated with tingling in the left arm.  No weakness in the left upper extremities.  Patient denies any heavy lifting, falls or trauma to the neck or left upper extremity. Patient started doing push-ups over the past few weeks.  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 Authorizing Provider  bictegravir-emtricitabine-tenofovir AF (BIKTARVY) 50-200-25 MG TABS tablet Take 1 tablet by mouth daily. 11/04/22  Yes Veryl Speak, FNP  methocarbamol (ROBAXIN) 500 MG  tablet Take 1 tablet (500 mg total) by mouth at bedtime as needed for muscle spasms. 03/02/23  Yes Makynna Manocchio, Britta Mccreedy, MD  traMADol (ULTRAM) 50 MG tablet Take 1 tablet (50 mg total) by mouth every 12 (twelve) hours as needed. 03/02/23  Yes Lillian Ballester, Britta Mccreedy, MD    Family History Family History  Problem Relation Age of Onset   Breast cancer Mother        breast   Prostate cancer Father    Cancer Maternal Grandmother    Diabetes Paternal Grandmother    Diabetes Daughter    Lung cancer Maternal Aunt     Social History Social History   Tobacco Use   Smoking status: Every Day    Current packs/day: 0.30    Types: Cigarettes   Smokeless tobacco: Never  Vaping Use   Vaping status: Never Used  Substance Use Topics   Alcohol use: Yes    Comment: occasionally   Drug use: Yes    Types: Marijuana    Comment: daily     Allergies   Penicillins   Review of Systems Review of Systems As per HPI  Physical Exam Triage Vital Signs ED Triage Vitals  Encounter Vitals Group     BP 03/02/23 0952 133/87     Systolic BP Percentile --      Diastolic BP Percentile --  Pulse Rate 03/02/23 0952 80     Resp 03/02/23 0952 18     Temp 03/02/23 0952 98.1 F (36.7 C)     Temp Source 03/02/23 0952 Oral     SpO2 03/02/23 0952 99 %     Weight 03/02/23 0951 168 lb 6.9 oz (76.4 kg)     Height 03/02/23 0951 5\' 4"  (1.626 m)     Head Circumference --      Peak Flow --      Pain Score 03/02/23 0950 10     Pain Loc --      Pain Education --      Exclude from Growth Chart --    No data found.  Updated Vital Signs BP 133/87 (BP Location: Right Arm)   Pulse 80   Temp 98.1 F (36.7 C) (Oral)   Resp 18   Ht 5\' 4"  (1.626 m)   Wt 76.4 kg   SpO2 99%   BMI 28.91 kg/m   Visual Acuity Right Eye Distance:   Left Eye Distance:   Bilateral Distance:    Right Eye Near:   Left Eye Near:    Bilateral Near:     Physical Exam Vitals and nursing note reviewed.  Constitutional:       General: He is not in acute distress.    Appearance: He is not ill-appearing.  Musculoskeletal:     Comments: Full range of motion of the left shoulder.  Power is 5/5 in both extremities.  Full range of motion of cervical spine.  Sensation is intact.  Neurological:     Mental Status: He is alert.      UC Treatments / Results  Labs (all labs ordered are listed, but only abnormal results are displayed) Labs Reviewed - No data to display  EKG   Radiology No results found.  Procedures Procedures (including critical care time)  Medications Ordered in UC Medications  ketorolac (TORADOL) 30 MG/ML injection 30 mg (30 mg Intramuscular Given 03/02/23 1049)    Initial Impression / Assessment and Plan / UC Course  I have reviewed the triage vital signs and the nursing notes.  Pertinent labs & imaging results that were available during my care of the patient were reviewed by me and considered in my medical decision making (see chart for details).     1.  Musculoskeletal chest and left upper back pain: EKG shows normal sinus rhythm with early repolarization Toradol 30 mg IM x 1 dose Robaxin 500 mg at bedtime as needed-medication precautions given Tramadol 50 milligrams every 8-12 hours as needed for pain Heating pad use to help with pain and muscle stiffness Gentle stretching exercises recommended Return precautions given  Final Clinical Impressions(s) / UC Diagnoses   Final diagnoses:  Upper back pain on left side     Discharge Instructions      Gentle stretching exercises Please take medications as prescribed Please stretch before and after your workouts and you may need to back off doing strenuous push-ups for a few days to allow your body to heal. Heating pad use will help with soreness and pain Please feel free to return to urgent care if you have worsening symptoms.    ED Prescriptions     Medication Sig Dispense Auth. Provider   methocarbamol (ROBAXIN) 500  MG tablet Take 1 tablet (500 mg total) by mouth at bedtime as needed for muscle spasms. 10 tablet Aveleen Nevers, Britta Mccreedy, MD   traMADol (ULTRAM) 50 MG tablet Take 1  tablet (50 mg total) by mouth every 12 (twelve) hours as needed. 10 tablet Doreatha Offer, Britta Mccreedy, MD      I have reviewed the PDMP during this encounter.   Merrilee Jansky, MD 03/02/23 1130

## 2023-03-02 NOTE — ED Triage Notes (Addendum)
Back Pain X3 Days. States started a few weeks ago with neck pain. Pain with deep breaths and coughing. States moving around in the the left shoulder and arm.   Patient states now having 8/10 stabbing left side chest pain with pain and tingling in the left arm.

## 2023-03-02 NOTE — ED Notes (Signed)
Pt stated that he was leaving and he was not waiting

## 2023-03-02 NOTE — Discharge Instructions (Addendum)
Gentle stretching exercises Please take medications as prescribed Please stretch before and after your workouts and you may need to back off doing strenuous push-ups for a few days to allow your body to heal. Heating pad use will help with soreness and pain Please feel free to return to urgent care if you have worsening symptoms.

## 2023-03-05 ENCOUNTER — Ambulatory Visit: Payer: BLUE CROSS/BLUE SHIELD | Admitting: Family

## 2023-03-06 ENCOUNTER — Ambulatory Visit: Payer: BLUE CROSS/BLUE SHIELD | Admitting: Family

## 2023-03-10 ENCOUNTER — Ambulatory Visit: Payer: BLUE CROSS/BLUE SHIELD | Admitting: Cardiovascular Disease

## 2023-03-12 ENCOUNTER — Encounter: Payer: BLUE CROSS/BLUE SHIELD | Admitting: Student

## 2023-03-12 ENCOUNTER — Encounter: Payer: Self-pay | Admitting: Cardiology

## 2023-03-12 NOTE — Progress Notes (Signed)
 Cardiology Office Note   Date:  03/13/2023   ID:  Rodney Chase, DOB 20-Feb-1976, MRN [id number]  PCP:  Volney Leash, MD  Cardiologist:   Lynwood Schilling, MD Referring:  Rodney Dayton BROCKS, DO  Chief Complaint  Patient presents with   Palpitations      History of Present Illness: Rodney Chase is a 48 y.o. male who presents for evaluation of palpitations.  The patient has no past cardiac history other than EKGs.  He reports symptoms of his heart rate increasing particularly with activity.  He thinks this is out of proportion to what he is doing at times.  He also has some sharp fleeting discomfort sporadically.  This does not happen daily.  He cannot bring this on with activity.  It happens randomly and is sharp without radiation to his jaw or to his arms.  There are no associated symptoms.  He has had a couple episodes of syncope.  He reports 2 of these over the 7 years that he has been living in [place].  He says that they happen suddenly.  Once he was at a party but he had not really been drinking or doing anything else out of the ordinary.  He does use marijuana daily.  He smokes cigarettes rarely.  He drinks rarely.  He says he has not noted any trigger for the episodes that he is had.  He did not seek medical attention does not seem.  He has not had one in a few years.  But he does have the other symptoms as mentioned.  Of note he was in the emergency room in December but did not wait to be seen.    Past Medical History:  Diagnosis Date   Floaters with photopsia    HIV (human immunodeficiency virus infection) (HCC)    Tobacco use     Past Surgical History:  Procedure Laterality Date   NO PAST SURGERIES       Current Outpatient Medications  Medication Sig Dispense Refill   bictegravir-emtricitabine -tenofovir  AF (BIKTARVY ) 50-200-25 MG TABS tablet Take 1 tablet by mouth daily. 30 tablet 5   methocarbamol  (ROBAXIN ) 500 MG tablet Take 1 tablet (500 mg total) by  mouth at bedtime as needed for muscle spasms. (Patient not taking: Reported on 03/13/2023) 10 tablet 0   traMADol  (ULTRAM ) 50 MG tablet Take 1 tablet (50 mg total) by mouth every 12 (twelve) hours as needed. (Patient not taking: Reported on 03/13/2023) 10 tablet 0   No current facility-administered medications for this visit.    Allergies:   Penicillins    Social History:  The patient  reports that he has been smoking cigarettes. He has never used smokeless tobacco. He reports current alcohol use. He reports current drug use. Drug: Marijuana.   Family History:  The patient's family history includes Breast cancer in his mother; Cancer in his maternal grandmother; Diabetes in his daughter and paternal grandmother; Lung cancer in his maternal aunt; Prostate cancer in his father.    ROS:  Please see the history of present illness.   Otherwise, review of systems are positive for none.   All other systems are reviewed and negative.    PHYSICAL EXAM: VS:  BP 132/78   Pulse 78   Ht 5' 4 (1.626 m)   Wt 132 lb (59.9 kg)   SpO2 98%   BMI 22.66 kg/m  , BMI Body mass index is 22.66 kg/m. GENERAL:  Well appearing HEENT:  Pupils equal round and  reactive, fundi not visualized, oral mucosa unremarkable NECK:  No jugular venous distention, waveform within normal limits, carotid upstroke brisk and symmetric, no bruits, no thyromegaly LYMPHATICS:  No cervical, inguinal adenopathy LUNGS:  Clear to auscultation bilaterally BACK:  No CVA tenderness CHEST:  Unremarkable HEART:  PMI not displaced or sustained,S1 and S2 within normal limits, no S3, no S4, no clicks, no rubs, no murmurs ABD:  Flat, positive bowel sounds normal in frequency in pitch, no bruits, no rebound, no guarding, no midline pulsatile mass, no hepatomegaly, no splenomegaly EXT:  2 plus pulses throughout, no edema, no cyanosis no clubbing SKIN:  No rashes no nodules NEURO:  Cranial nerves II through XII grossly intact, motor grossly  intact throughout PSYCH:  Cognitively intact, oriented to person place and time    EKG:   Sinus rhythm, rate 78, axis within normal limits, intervals within normal limits, early repolarization pattern, no acute ST-T wave changes.  03/02/2023    Recent Labs: 06/24/2022: ALT 8 11/04/2022: BUN 13; Creat 0.94; Potassium 4.5; Sodium 139    Lipid Panel    Component Value Date/Time   CHOL 164 05/23/2021 0248   TRIG 61 05/23/2021 0248   HDL 54 05/23/2021 0248   CHOLHDL 3.0 05/23/2021 0248   VLDL 15 12/28/2015 1043   LDLCALC 95 05/23/2021 0248      Wt Readings from Last 3 Encounters:  03/13/23 132 lb (59.9 kg)  03/02/23 168 lb 6.9 oz (76.4 kg)  01/19/23 168 lb 6.4 oz (76.4 kg)      Other studies Reviewed: Additional studies/ records that were reviewed today include: ED records. Review of the above records demonstrates:  Please see elsewhere in the note.     ASSESSMENT AND PLAN:  Palpitations: The patient has some vague symptoms of tachypalpitations, distant syncope and nonanginal chest pain.  He was not orthostatic in the office today.  I am going to apply a 4-week monitor.  He has had a TSH ordered. He has had normal electrolytes a few months ago.  If the monitor is unremarkable I would not suggest further cardiovascular testing.   Current medicines are reviewed at length with the patient today.  The patient does not have concerns regarding medicines.  The following changes have been made:  no change  Labs/ tests ordered today include: None  Orders Placed This Encounter  Procedures   Cardiac event monitor     Disposition:   FU with me as needed and based on the results of the above.     Signed, Lynwood Schilling, MD  03/13/2023 1:20 PM    [facility]

## 2023-03-12 NOTE — Progress Notes (Deleted)
 Established Patient Office Visit  Subjective   Patient ID: Rodney Chase, male    DOB: [birth year]  Age: 48 y.o. MRN: [id number]  No chief complaint on file.   HPI  This is a 48 year old male living with a history stated below and presents today for one month follow up. Please see problem based assessment and plan for additional details.   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   Healthcare maintenance 06/21/2019   Human immunodeficiency virus (HIV) disease (HCC) 03/27/2006   Past Medical History:  Diagnosis Date   Floaters with photopsia    HIV (human immunodeficiency virus infection) (HCC)    Palpitations    Tobacco use    Past Surgical History:  Procedure Laterality Date   NO PAST SURGERIES     Social History   Tobacco Use   Smoking status: Every Day    Current packs/day: 0.30    Types: Cigarettes   Smokeless tobacco: Never  Vaping Use   Vaping status: Never Used  Substance Use Topics   Alcohol use: Yes    Comment: occasionally   Drug use: Yes    Types: Marijuana    Comment: daily   Family Status  Relation Name Status   Mother  Deceased   Father  (Not Specified)   MGM  Deceased   PGM  Deceased   Daughter  (Not Specified)   Mat Aunt  Alive  No partnership data on file   Family History  Problem Relation Age of Onset   Breast cancer Mother        breast   Prostate cancer Father    Cancer Maternal Grandmother    Diabetes Paternal Grandmother    Diabetes Daughter    Lung cancer Maternal Aunt    Allergies  Allergen Reactions   Penicillins     Has patient had a PCN reaction causing immediate rash, facial/tongue/throat swelling, SOB or lightheadedness with hypotension: YES Has patient had a PCN  reaction causing severe rash involving mucus membranes or skin necrosis: NO Has patient had a PCN reaction that required hospitalization: UNK Has patient had a PCN reaction occurring within the last 10 years: NO If all of the above answers are NO, then may proceed with Cephalosporin use.    ROS  ROS negative except for what is noted on the assessment and plan.    Objective:     There were no vitals taken for this visit. BP Readings from Last 3 Encounters:  03/02/23 133/87  01/19/23 121/77  11/04/22 108/79   Wt Readings from Last 3 Encounters:  03/02/23 168 lb 6.9 oz (76.4 kg)  01/19/23 168 lb 6.4 oz (76.4 kg)  12/05/22 127 lb (57.6 kg)   SpO2 Readings from Last 3 Encounters:  03/02/23 99%  01/19/23 100%  11/04/22 98%      Physical Exam   No results found for any visits on 03/12/23.  Last CBC Lab Results  Component Value Date   WBC 5.3 07/08/2020   HGB 14.0 07/08/2020   HCT 42.4 07/08/2020   MCV 86.2 07/08/2020   MCH 28.5 07/08/2020   RDW 14.1 07/08/2020   PLT 187 07/08/2020   Last metabolic panel Lab Results  Component Value Date   GLUCOSE  92 11/04/2022   NA 139 11/04/2022   K 4.5 11/04/2022   CL 105 11/04/2022   CO2 27 11/04/2022   BUN 13 11/04/2022   CREATININE 0.94 11/04/2022   EGFR 101 11/04/2022   CALCIUM 9.3 11/04/2022   PROT 6.2 06/24/2022   ALBUMIN 3.7 07/08/2020   BILITOT 0.5 06/24/2022   ALKPHOS 39 07/08/2020   AST 15 06/24/2022   ALT 8 (L) 06/24/2022   ANIONGAP 7 07/08/2020   Last lipids Lab Results  Component Value Date   CHOL 164 05/23/2021   HDL 54 05/23/2021   LDLCALC 95 05/23/2021   TRIG 61 05/23/2021   CHOLHDL 3.0 05/23/2021   Last hemoglobin A1c Lab Results  Component Value Date   HGBA1C 5.6 03/05/2018      The 10-year ASCVD risk score (Arnett DK, et al., 2019) is: 7.3%    Assessment & Plan:  Pruritic Rash Patient was prescribed Triamcinolone  ointment per the last visit. Apparently he watches over an older  gentleman who had similar rash as the patient. Will consider bed bugs in differential.   HIV Patient follows infectious disease and has an appointment in 04/02/2023.  Patient is taking Biktarvy  without any questions or concerns at this time.   Palpitations Patient with previous symptoms of shortness of breath upon exertion and near syncopal episodes with a lightheadedness.  Does attribute it to his anxiety. Cardiology referral per last visit, appointment scheduled on 03/13/2023.   12/30 ED for CP: EKG NSR.  - TSH penidng  Depression/Anxiety  GAD 7 = , PHQ 9 = ; Consider starting sertraline .    Problem List Items Addressed This Visit   None   No follow-ups on file.    Toma Edwards, DO

## 2023-03-13 ENCOUNTER — Ambulatory Visit: Payer: BLUE CROSS/BLUE SHIELD | Attending: Cardiology | Admitting: Cardiology

## 2023-03-13 ENCOUNTER — Encounter: Payer: Self-pay | Admitting: Cardiology

## 2023-03-13 VITALS — BP 132/78 | HR 78 | Ht 64.0 in | Wt 132.0 lb

## 2023-03-13 DIAGNOSIS — R002 Palpitations: Secondary | ICD-10-CM

## 2023-03-13 NOTE — Patient Instructions (Signed)
 Medication Instructions:  No changes.  *If you need a refill on your cardiac medications before your next appointment, please call your pharmacy*   Testing/Procedures: Your physician has recommended that you wear an event monitor. Wear for 4 weeks. Event monitors are medical devices that record the heart's electrical activity. Doctors most often us  these monitors to diagnose arrhythmias. Arrhythmias are problems with the speed or rhythm of the heartbeat. The monitor is a small, portable device. You can wear one while you do your normal daily activities. This is usually used to diagnose what is causing palpitations/syncope (passing out).    Follow-Up: At [facility], you and your health needs are our priority.  As part of our continuing mission to provide you with exceptional heart care, we have created designated Provider Care Teams.  These Care Teams include your primary Cardiologist (physician) and Advanced Practice Providers (APPs -  Physician Assistants and Nurse Practitioners) who all work together to provide you with the care you need, when you need it.  We recommend signing up for the patient portal called MyChart.  Sign up information is provided on this After Visit Summary.  MyChart is used to connect with patients for Virtual Visits (Telemedicine).  Patients are able to view lab/test results, encounter notes, upcoming appointments, etc.  Non-urgent messages can be sent to your provider as well.   To learn more about what you can do with MyChart, go to [email or web address].    Your next appointment:    As needed.   Provider:   Lynwood Schilling, MD

## 2023-03-19 DIAGNOSIS — R002 Palpitations: Secondary | ICD-10-CM | POA: Diagnosis not present

## 2023-04-02 ENCOUNTER — Encounter: Payer: Self-pay | Admitting: Family

## 2023-04-02 ENCOUNTER — Ambulatory Visit (INDEPENDENT_AMBULATORY_CARE_PROVIDER_SITE_OTHER): Payer: BLUE CROSS/BLUE SHIELD | Admitting: Family

## 2023-04-02 ENCOUNTER — Other Ambulatory Visit: Payer: Self-pay

## 2023-04-02 ENCOUNTER — Other Ambulatory Visit (HOSPITAL_COMMUNITY)
Admission: RE | Admit: 2023-04-02 | Discharge: 2023-04-02 | Disposition: A | Discharge status: Home / Self Care | Payer: BLUE CROSS/BLUE SHIELD | Source: Ambulatory Visit | Attending: Family | Admitting: Family

## 2023-04-02 VITALS — BP 118/78 | HR 89 | Temp 98.5°F | Wt 129.0 lb

## 2023-04-02 DIAGNOSIS — B20 Human immunodeficiency virus [HIV] disease: Secondary | ICD-10-CM

## 2023-04-02 DIAGNOSIS — Z1212 Encounter for screening for malignant neoplasm of rectum: Secondary | ICD-10-CM

## 2023-04-02 DIAGNOSIS — Z113 Encounter for screening for infections with a predominantly sexual mode of transmission: Secondary | ICD-10-CM

## 2023-04-02 DIAGNOSIS — Z0000 Encounter for general adult medical examination without abnormal findings: Secondary | ICD-10-CM

## 2023-04-02 MED ORDER — BIKTARVY 50-200-25 MG PO TABS: 1.0000 | ORAL_TABLET | Freq: Every day | ORAL | 5 refills | Status: DC

## 2023-04-02 NOTE — Assessment & Plan Note (Signed)
Mr. Porzio continues to have well-controlled virus with good adherence and tolerance to Biktarvy.  Reviewed previous lab work and discussed plan of care and will equals U.  Check blood work.  Covered by [facility].  Continue current dose of Biktarvy.  Plan for follow-up in 6 months or sooner if needed with lab work on the same day.

## 2023-04-02 NOTE — Progress Notes (Signed)
Brief Narrative   Patient ID: Rodney Chase, male    DOB: Feb 07, 1976, 48 y.o.   MRN: [id number]  Mr. Olarte is a 48 y/o male diagnosed with HIV disease in August 2007 with risk factor of MSM. Initial viral load and CD4 count are unavailable. Genotypes with no medication resistant mutations. No history of opportunistic infection. [id number] negative. Previous ART experience with [facility] and Triumeq.    Subjective:    Chief Complaint  Patient presents with   Follow-up    HPI:  Rodney Chase is a 48 y.o. male with HIV disease last seen on on 11/24/2022 with well-controlled virus and good adherence and tolerance to [employer].  Viral load was undetectable with CD4 count 825.  Renal function and electrolytes within normal ranges.  RPR was nonreactive.  Here today for routine follow-up.  [name] has been doing okay since his last office visit and continues to take [facility] as prescribed with no adverse side effects or problems obtaining medication from the pharmacy.  Covered by [facility].  No new concerns/complaints.  Condoms and site-specific STD testing offered.  Healthcare maintenance reviewed and due for anal Pap smear.  Denies fevers, chills, night sweats, headaches, changes in vision, neck pain/stiffness, nausea, diarrhea, vomiting, lesions or rashes.  Lab Results  Component Value Date   CD4TCELL 35 11/04/2022   CD4TABS 825 11/04/2022   Lab Results  Component Value Date   HIV1RNAQUANT Not Detected 11/04/2022     Allergies  Allergen Reactions   Penicillins     Has patient had a PCN reaction causing immediate rash, facial/tongue/throat swelling, SOB or lightheadedness with hypotension: YES Has patient had a PCN reaction causing severe rash involving mucus membranes or skin necrosis: NO Has patient had a PCN reaction that required hospitalization: UNK Has patient had a PCN reaction occurring within the last 10 years: NO If all of the above answers are "NO", then  may proceed with Cephalosporin use.      Outpatient Medications Prior to Visit  Medication Sig Dispense Refill   methocarbamol (ROBAXIN) 500 MG tablet Take 1 tablet (500 mg total) by mouth at bedtime as needed for muscle spasms. 10 tablet 0   traMADol (ULTRAM) 50 MG tablet Take 1 tablet (50 mg total) by mouth every 12 (twelve) hours as needed. 10 tablet 0   bictegravir-emtricitabine-tenofovir AF (BIKTARVY) 50-200-25 MG TABS tablet Take 1 tablet by mouth daily. 30 tablet 5   No facility-administered medications prior to visit.     Past Medical History:  Diagnosis Date   Floaters with photopsia    HIV (human immunodeficiency virus infection) (HCC)    Tobacco use      Past Surgical History:  Procedure Laterality Date   NO PAST SURGERIES        Review of Systems  Constitutional:  Negative for appetite change, chills, fatigue, fever and unexpected weight change.  Eyes:  Negative for visual disturbance.  Respiratory:  Negative for cough, chest tightness, shortness of breath and wheezing.   Cardiovascular:  Negative for chest pain and leg swelling.  Gastrointestinal:  Negative for abdominal pain, constipation, diarrhea, nausea and vomiting.  Genitourinary:  Negative for dysuria, flank pain, frequency, genital sores, hematuria and urgency.  Skin:  Negative for rash.  Allergic/Immunologic: Negative for immunocompromised state.  Neurological:  Negative for dizziness and headaches.      Objective:    BP 118/78   Pulse 89   Temp 98.5 F (36.9 C) (Oral)   Wt 129  lb (58.5 kg)   SpO2 97%   BMI 22.14 kg/m  Nursing note and vital signs reviewed.  Physical Exam Constitutional:      General: He is not in acute distress.    Appearance: He is well-developed.  Eyes:     Conjunctiva/sclera: Conjunctivae normal.  Cardiovascular:     Rate and Rhythm: Normal rate and regular rhythm.     Heart sounds: Normal heart sounds. No murmur heard.    No friction rub. No gallop.   Pulmonary:     Effort: Pulmonary effort is normal. No respiratory distress.     Breath sounds: Normal breath sounds. No wheezing or rales.  Chest:     Chest wall: No tenderness.  Abdominal:     General: Bowel sounds are normal.     Palpations: Abdomen is soft.     Tenderness: There is no abdominal tenderness.  Musculoskeletal:     Cervical back: Neck supple.  Lymphadenopathy:     Cervical: No cervical adenopathy.  Skin:    General: Skin is warm and dry.     Findings: No rash.  Neurological:     Mental Status: He is alert and oriented to person, place, and time.  Psychiatric:        Behavior: Behavior normal.        Thought Content: Thought content normal.        Judgment: Judgment normal.         01/19/2023    2:48 PM 11/04/2022   10:40 AM 01/13/2022    2:36 PM 09/17/2021   10:18 AM 01/14/2021    2:06 PM  Depression screen PHQ 2/9  Decreased Interest 0 0 2 0 3  Down, Depressed, Hopeless 2 0 1 0 1  PHQ - 2 Score 2 0 3 0 4  Altered sleeping 0  2  3  Tired, decreased energy 3  2  3   Change in appetite 3  3  1   Feeling bad or failure about yourself  0  1  3  Trouble concentrating 0  0  1  Moving slowly or fidgety/restless 0  3  3  Suicidal thoughts 0  0  0  PHQ-9 Score 8  14  18   Difficult doing work/chores Somewhat difficult  Somewhat difficult  Somewhat difficult       Assessment & Plan:    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 continues to have well-controlled virus with  good adherence and tolerance to Biktarvy.  Reviewed previous lab work and discussed plan of care and will equals U.  Check blood work.  Covered by [facility].  Continue current dose of Biktarvy.  Plan for follow-up in 6 months or sooner if needed with lab work on the same day.      Relevant Medications   bictegravir-emtricitabine-tenofovir AF (BIKTARVY) 50-200-25 MG TABS tablet   Other Relevant Orders   COMPLETE METABOLIC PANEL WITH GFR   HIV-1 RNA quant-no reflex-bld   T-helper cell (CD4)- (  [facility] clinic only)   Healthcare maintenance   Discussed importance of safe sexual practice and condom use. Condoms and site specific STD testing offered.  Vaccinations reviewed and declined. Routine dental care up to date.  Due for colonoscopy to be scheduled after heart evaluation Anal pap smear collected.       Other Visit Diagnoses       Screening for STDs (sexually transmitted diseases)       Relevant Orders   RPR     Screening for rectal cancer       Relevant Orders   Cytology - PAP        I am having [name] maintain his methocarbamol, traMADol, and Biktarvy.   Meds ordered this encounter  Medications   bictegravir-emtricitabine-tenofovir AF (BIKTARVY) 50-200-25 MG TABS tablet    Sig: Take 1 tablet by mouth daily.    Dispense:  30 tablet    Refill:  5    Supervising Provider:   Judyann Munson [phone]    Prescription Type::   Renewal     Follow-up: Return in about 6 months (around 09/30/2023). or sooner if needed.    Marcos Eke, MSN, FNP-C Nurse Practitioner [facility] for Infectious Disease [facility] Main number: [phone]

## 2023-04-02 NOTE — Assessment & Plan Note (Signed)
Discussed importance of safe sexual practice and condom use. Condoms and site specific STD testing offered.  Vaccinations reviewed and declined. Routine dental care up to date.  Due for colonoscopy to be scheduled after heart evaluation Anal pap smear collected.

## 2023-04-02 NOTE — Patient Instructions (Addendum)
Nice to see you. ? ?We will check your lab work today. ? ?Continue to take your medication daily as prescribed. ? ?Refills have been sent to the pharmacy. ? ?Plan for follow up in 6 months or sooner if needed with lab work on the same day. ? ?Have a great day and stay safe! ? ?

## 2023-04-03 LAB — T-HELPER CELL (CD4) - (RCID CLINIC ONLY)
CD4 % Helper T Cell: 39 % (ref 33–65)
CD4 T Cell Abs: 1027 /uL (ref 400–1790)

## 2023-04-06 LAB — COMPLETE METABOLIC PANEL WITH GFR
AG Ratio: 1.6 (calc) (ref 1.0–2.5)
ALT: 9 U/L (ref 9–46)
AST: 16 U/L (ref 10–40)
Albumin: 4.2 g/dL (ref 3.6–5.1)
Alkaline phosphatase (APISO): 48 U/L (ref 36–130)
BUN: 11 mg/dL (ref 7–25)
CO2: 29 mmol/L (ref 20–32)
Calcium: 9.5 mg/dL (ref 8.6–10.3)
Chloride: 104 mmol/L (ref 98–110)
Creat: 1.01 mg/dL (ref 0.60–1.29)
Globulin: 2.7 g/dL (ref 1.9–3.7)
Glucose, Bld: 93 mg/dL (ref 65–99)
Potassium: 4.5 mmol/L (ref 3.5–5.3)
Sodium: 138 mmol/L (ref 135–146)
Total Bilirubin: 0.4 mg/dL (ref 0.2–1.2)
Total Protein: 6.9 g/dL (ref 6.1–8.1)
eGFR: 93 mL/min/{1.73_m2} (ref >=60)

## 2023-04-06 LAB — HIV-1 RNA QUANT-NO REFLEX-BLD
HIV 1 RNA Quant: NOT DETECTED {copies}/mL
HIV-1 RNA Quant, Log: NOT DETECTED {Log}

## 2023-04-06 LAB — RPR: RPR Ser Ql: NONREACTIVE

## 2023-04-09 ENCOUNTER — Other Ambulatory Visit (HOSPITAL_COMMUNITY): Payer: Self-pay

## 2023-04-13 ENCOUNTER — Other Ambulatory Visit (HOSPITAL_COMMUNITY): Payer: Self-pay

## 2023-04-15 LAB — CYTOLOGY - PAP
Comment: NEGATIVE
Comment: NEGATIVE
Comment: NEGATIVE
Diagnosis: UNDETERMINED — AB
HPV 16: NEGATIVE
HPV 18 / 45: NEGATIVE
High risk HPV: POSITIVE — AB

## 2023-04-18 ENCOUNTER — Other Ambulatory Visit (HOSPITAL_BASED_OUTPATIENT_CLINIC_OR_DEPARTMENT_OTHER): Payer: Self-pay

## 2023-04-20 ENCOUNTER — Other Ambulatory Visit: Payer: Self-pay | Admitting: Family

## 2023-04-20 ENCOUNTER — Ambulatory Visit: Payer: BLUE CROSS/BLUE SHIELD | Attending: Cardiology

## 2023-04-20 DIAGNOSIS — K6289 Other specified diseases of anus and rectum: Secondary | ICD-10-CM

## 2023-04-20 DIAGNOSIS — R002 Palpitations: Secondary | ICD-10-CM

## 2023-04-20 DIAGNOSIS — R85610 Atypical squamous cells of undetermined significance on cytologic smear of anus (ASC-US): Secondary | ICD-10-CM

## 2023-04-23 ENCOUNTER — Encounter (HOSPITAL_COMMUNITY): Payer: Self-pay | Admitting: *Deleted

## 2023-04-23 ENCOUNTER — Ambulatory Visit (HOSPITAL_COMMUNITY)
Admission: EM | Admit: 2023-04-23 | Discharge: 2023-04-23 | Disposition: A | Discharge status: Home / Self Care | Payer: BLUE CROSS/BLUE SHIELD | Attending: Emergency Medicine | Admitting: Emergency Medicine

## 2023-04-23 DIAGNOSIS — J101 Influenza due to other identified influenza virus with other respiratory manifestations: Secondary | ICD-10-CM | POA: Diagnosis not present

## 2023-04-23 LAB — POC COVID19/FLU A&B COMBO
Covid Antigen, POC: NEGATIVE
Influenza A Antigen, POC: POSITIVE — AB
Influenza B Antigen, POC: NEGATIVE

## 2023-04-23 MED ORDER — IBUPROFEN 800 MG PO TABS
800.0000 mg | ORAL_TABLET | Freq: Once | ORAL | Status: AC
  Administered 2023-04-23: 800 mg via ORAL

## 2023-04-23 MED ORDER — ACETAMINOPHEN 325 MG PO TABS
650.0000 mg | ORAL_TABLET | Freq: Once | ORAL | Status: AC
  Administered 2023-04-23: 650 mg via ORAL

## 2023-04-23 MED ORDER — IBUPROFEN 800 MG PO TABS
ORAL_TABLET | ORAL | Status: AC
  Filled 2023-04-23: qty 1

## 2023-04-23 MED ORDER — ACETAMINOPHEN 325 MG PO TABS
ORAL_TABLET | ORAL | Status: AC
  Filled 2023-04-23: qty 2

## 2023-04-23 MED ORDER — ONDANSETRON 4 MG PO TBDP: 4.0000 mg | ORAL_TABLET | Freq: Three times a day (TID) | ORAL | 0 refills | Status: DC | PRN

## 2023-04-23 NOTE — ED Triage Notes (Signed)
 Pt states he has been off his Biktarvy for 7-15 days and restarted yesterday. He would like his numbers checked and make sure his dose doesn't need adjusted.   He states he has no energy, insomnia, weakness, cough, fever, congestion. He has been taking tylenol as needed. He complains of left flank pain that is a 7/10, when it kicks in.

## 2023-04-23 NOTE — ED Provider Notes (Signed)
 [facility]    CSN: [id number] Arrival date & time: 04/23/23  1011      History   Chief Complaint Chief Complaint  Patient presents with   Fever   Generalized Body Aches   Insomnia   Flank Pain   Weakness    HPI Rodney Chase is a 48 y.o. male.   Patient presented with fever, fatigue, cough, congestion, body aches, nausea, and vomiting. Denies shortness of breath, chest pain, diarrhea, and abdominal pain.   History of HIV and states that he recently was without his Biktarvy, but restarted it yesterday. Patient concerned that his dosage needs to be adjusted due to not taking for a period of time.    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 Authorizing Provider  bictegravir-emtricitabine-tenofovir AF (BIKTARVY) 50-200-25 MG TABS tablet Take 1 tablet by mouth daily. 04/02/23  Yes Veryl Speak, FNP  ondansetron (ZOFRAN-ODT) 4 MG disintegrating tablet Take 1 tablet (4 mg total) by mouth every 8 (eight) hours as needed for nausea or vomiting. 04/23/23  Yes Wynonia Lawman A, NP  methocarbamol (ROBAXIN) 500 MG tablet Take 1 tablet (500 mg total) by mouth at bedtime as needed for muscle spasms. 03/02/23   LampteyBritta Mccreedy, MD  traMADol (ULTRAM) 50 MG tablet Take 1 tablet (50 mg total) by mouth every 12 (twelve) hours as needed. 03/02/23   Lamptey, Britta Mccreedy, MD    Family History Family History  Problem Relation Age of Onset   Breast cancer Mother        breast   Prostate cancer Father    Cancer Maternal Grandmother    Diabetes Paternal Grandmother    Diabetes Daughter    Lung cancer Maternal Aunt     Social History Social History   Tobacco Use   Smoking status: Every Day    Current packs/day: 0.30    Types: Cigarettes   Smokeless tobacco: Never  Vaping Use   Vaping status: Never Used  Substance Use Topics   Alcohol use: Yes    Comment: occasionally   Drug use: Yes    Types: Marijuana    Comment: daily     Allergies   Penicillins   Review of Systems Review of Systems  Constitutional:  Positive for fever.  Genitourinary:  Positive for flank pain.  Neurological:  Positive for weakness.  Psychiatric/Behavioral:  The patient has insomnia.    Per HPI  Physical Exam Triage Vital Signs ED Triage Vitals  Encounter Vitals Group     BP 04/23/23 1042 114/77     Systolic BP Percentile --      Diastolic BP Percentile --      Pulse Rate 04/23/23  1042 94     Resp 04/23/23 1042 18     Temp 04/23/23 1042 (!) 102.8 F (39.3 C)     Temp Source 04/23/23 1042 Oral     SpO2 04/23/23 1042 96 %     Weight --      Height --      Head Circumference --      Peak Flow --      Pain Score 04/23/23 1041 7     Pain Loc --      Pain Education --      Exclude from Growth Chart --    No data found.  Updated Vital Signs BP 114/77 (BP Location: Right Arm)   Pulse 94   Temp (!) 101.6 F (38.7 C) (Oral)   Resp 18   SpO2 96%   Visual Acuity Right Eye Distance:   Left Eye Distance:   Bilateral Distance:    Right Eye Near:   Left Eye Near:    Bilateral Near:     Physical Exam Vitals and nursing note reviewed.  Constitutional:      General: He is awake. He is not in acute distress.     Appearance: Normal appearance. He is well-developed and well-groomed. He is ill-appearing. He is not toxic-appearing or diaphoretic.  HENT:     Right Ear: Tympanic membrane, ear canal and external ear normal.     Left Ear: Tympanic membrane, ear canal and external ear normal.     Nose: Congestion and rhinorrhea present.     Mouth/Throat:     Mouth: Mucous membranes are moist.     Pharynx: Posterior oropharyngeal erythema present. No oropharyngeal exudate.  Cardiovascular:     Rate and Rhythm: Normal rate and regular rhythm.  Pulmonary:     Effort: Pulmonary effort is normal.     Breath sounds: Normal breath sounds.  Abdominal:     General: Abdomen is flat. Bowel sounds are normal.     Palpations: Abdomen is soft.     Tenderness: There is no abdominal tenderness. There is no right CVA tenderness or left CVA tenderness.  Musculoskeletal:        General: Normal range of motion.  Skin:    General: Skin is warm and dry.  Neurological:     Mental Status: He is alert.  Psychiatric:        Behavior: Behavior is cooperative.      UC Treatments / Results  Labs (all labs ordered are listed, but only abnormal results are displayed) Labs Reviewed  POC COVID19/FLU A&B COMBO - Abnormal; Notable for the following components:      Result Value   Influenza A Antigen, POC Positive (*)    All other components within normal limits    EKG   Radiology No results found.  Procedures Procedures (including critical care time)  Medications Ordered in UC Medications  acetaminophen (TYLENOL) tablet 650 mg (650 mg Oral Given 04/23/23 1048)  ibuprofen (ADVIL) tablet 800 mg (800 mg Oral Given 04/23/23 1123)    Initial Impression / Assessment and Plan / UC Course  I have reviewed the triage vital signs and the nursing notes.  Pertinent labs & imaging results that were available during my care of the patient were reviewed by me and considered in my medical decision making (see chart for  details).     Patient presented with 4-day history of fever, fatigue, cough, congestion, body aches,nausea, and vomiting.   Upon assessment patient is ill-appearing. Congestion  and rhinorrhea are present, mild erythema noted to pharynx. Lungs clear bilaterally on auscultation. Nontender upon palpation to abdomen.   Tested positive for influenza A today. Prescribed Zofran as needed for nausea/vomiting. Discussed OTC medication for symptoms. Discussed fever management and importance of hydration.   Patient concerned that his Biktarvy dosage may need to be adjusted due to brief time without taking it. Recommended following up with infectious disease regarding possible dose adjustment.   Discussed follow-up and return precautions.  Final Clinical Impressions(s) / UC Diagnoses   Final diagnoses:  Influenza A     Discharge Instructions      You tested positive for influenza A today. As discussed because your symptoms started 4 days ago you are outside the window for Tamiflu treatment today. You can take Zofran every 8 hours as needed for nausea and vomiting. You can alternate between Tylenol and Ibuprofen as needed for pain and fever. I recommend Mucinex for cough and congestion as needed. Stay hydrated and get plenty of rest. Return here if symptoms persist or worsen.   I recommend following up with your infectious disease doctor regarding concerns about recently being without Biktarvy and the possibility of adjusting your dose.      ED Prescriptions     Medication Sig Dispense Auth. Provider   ondansetron (ZOFRAN-ODT) 4 MG disintegrating tablet Take 1 tablet (4 mg total) by mouth every 8 (eight) hours as needed for nausea or vomiting. 10 tablet Wynonia Lawman A, NP      PDMP not reviewed this encounter.   Wynonia Lawman A, NP 04/23/23 1134

## 2023-04-23 NOTE — Discharge Instructions (Addendum)
 You tested positive for influenza A today. As discussed because your symptoms started 4 days ago you are outside the window for Tamiflu treatment today. You can take Zofran every 8 hours as needed for nausea and vomiting. You can alternate between Tylenol and Ibuprofen as needed for pain and fever. I recommend Mucinex for cough and congestion as needed. Stay hydrated and get plenty of rest. Return here if symptoms persist or worsen.   I recommend following up with your infectious disease doctor regarding concerns about recently being without Biktarvy and the possibility of adjusting your dose.

## 2023-04-29 ENCOUNTER — Telehealth: Payer: Self-pay

## 2023-04-29 NOTE — Telephone Encounter (Signed)
 Rollene Rotunda, MD  Delton Prairie, RN No sustained arrhythmias.  Chest pain and fatigue occurred during normal sinus rhythm.  No change in therapy. Called and left VMM.

## 2023-04-29 NOTE — Telephone Encounter (Signed)
-----   Message from Rollene Rotunda sent at 04/26/2023  2:51 PM EST ----- No sustained arrhythmias.  Chest pain and fatigue occurred during normal sinus rhythm.  No change in therapy.

## 2023-04-30 ENCOUNTER — Other Ambulatory Visit: Payer: Self-pay

## 2023-04-30 ENCOUNTER — Telehealth: Payer: Self-pay | Admitting: Family

## 2023-04-30 DIAGNOSIS — B20 Human immunodeficiency virus [HIV] disease: Secondary | ICD-10-CM

## 2023-04-30 MED ORDER — BIKTARVY 50-200-25 MG PO TABS: 1.0000 | ORAL_TABLET | Freq: Every day | ORAL | 1 refills | Status: DC

## 2023-04-30 MED ORDER — ENSURE PO LIQD: 237.0000 mL | Freq: Two times a day (BID) | ORAL | 11 refills | Status: DC

## 2023-04-30 NOTE — Telephone Encounter (Signed)
 Informed patient Ensure Script has been sent to [facility].  If patient is unhappy with caseworker he will need to let [facility] know.  Patient will contact [facility].   RX for [employer] sent to [facility].    Marcell Anger, CMA

## 2023-04-30 NOTE — Telephone Encounter (Signed)
 90 day supply sent to [facility].Left vm updating pt. Juanita Laster, RMA

## 2023-04-30 NOTE — Telephone Encounter (Signed)
 Rodney Chase left a voicemail to inquire about a 90 supply of Biktarvy and to get the information for his case Production designer, theatre/television/film. I called back to inform Rodney Chase the nurse was able to send in the 90 day supply to [facility]. He stated he spoke with his case manager, Rodney Chase, who is unable to get him Ensure until 3/11. He says he has never had an issue before and has concerns to go without for that long. He has lost weight and is back down to 120 lbs. He asked if there was anything Rodney Chase could do to assist or expedite the order. He expressed how unpleasant his experience has been with [facility] lately. He can be reached at [phone].

## 2023-04-30 NOTE — Telephone Encounter (Signed)
 Kemp called inquiring if he could receive a 90 day supply on Biktarvy to avoid the hassle each time with the pharmacy. He would like to have some on deck, if possible. Patient can be reach at [phone].

## 2023-04-30 NOTE — Addendum Note (Signed)
 Addended by: Juanita Laster on: 04/30/2023 01:07 PM   Modules accepted: Orders

## 2023-05-14 ENCOUNTER — Telehealth: Payer: Self-pay | Admitting: Family

## 2023-05-14 NOTE — Telephone Encounter (Signed)
 error

## 2023-05-25 ENCOUNTER — Telehealth: Payer: Self-pay

## 2023-05-25 NOTE — Telephone Encounter (Signed)
 Pt called upset regarding issues he is having with case manger at [facility]. Has not received or heard anything from case manager regarding Ensure. Is requesting new case Production designer, theatre/television/film.  Emailed Adriana with [facility] to follow up on pt concerns. Carver Fila, FNP notified.  Juanita Laster, RMA

## 2023-06-05 ENCOUNTER — Telehealth: Payer: Self-pay

## 2023-06-05 NOTE — Telephone Encounter (Signed)
 Patient called stating that he has been having a lot of fatigue lately and wanted to get his iron checked. Advised I could send a message back to provider to see if labs could be ordered.   Virgie Kunda Lesli Albee, CMA

## 2023-06-22 ENCOUNTER — Telehealth: Payer: Self-pay

## 2023-06-22 ENCOUNTER — Other Ambulatory Visit (HOSPITAL_COMMUNITY): Payer: Self-pay

## 2023-06-22 NOTE — Telephone Encounter (Signed)
 Patient called office stating he is having trouble filling his Biktarvy  from [facility]. Was told that they are not able to run medication through his insurance provider. Is showing BCBS and medicaid.  Is requesting a 7 day prescription be sent to pharmacy.  Per Abe Abed patient will need to call Medicaid and inform them he no longer has BCBS.  Will pickup samples tomorrow.  Julien Odor, RMA

## 2023-06-29 ENCOUNTER — Other Ambulatory Visit: Payer: Self-pay

## 2023-06-29 ENCOUNTER — Other Ambulatory Visit (HOSPITAL_COMMUNITY): Payer: Self-pay

## 2023-06-29 DIAGNOSIS — B20 Human immunodeficiency virus [HIV] disease: Secondary | ICD-10-CM

## 2023-06-29 MED ORDER — BIKTARVY 50-200-25 MG PO TABS: 1.0000 | ORAL_TABLET | Freq: Every day | ORAL | 3 refills | Status: DC

## 2023-07-01 ENCOUNTER — Other Ambulatory Visit: Payer: Self-pay | Admitting: Pharmacist

## 2023-07-01 DIAGNOSIS — B20 Human immunodeficiency virus [HIV] disease: Secondary | ICD-10-CM

## 2023-07-01 MED ORDER — BICTEGRAVIR-EMTRICITAB-TENOFOV 50-200-25 MG PO TABS: 1.0000 | ORAL_TABLET | Freq: Every day | ORAL | Status: AC

## 2023-07-01 NOTE — Progress Notes (Signed)
 Medication Samples have been provided to the patient.  Drug name: Biktarvy         Strength: 50/200/25 mg       Qty: 7 tablets (1 bottles) LOT: CTGMDA   Exp.Date: 7/27  Samples requested by Erika Haver.  Dosing instructions: Take one tablet by mouth once daily  The patient has been instructed regarding the correct time, dose, and frequency of taking this medication, including desired effects and most common side effects.   Nicklas Barns, PharmD, CPP, BCIDP, AAHIVP Clinical Pharmacist Practitioner Infectious Diseases Clinical Pharmacist [facility] for Infectious Disease

## 2023-07-30 NOTE — Progress Notes (Signed)
 The 10-year ASCVD risk score (Arnett DK, et al., 2019) is: 7.2%   Values used to calculate the score:     Age: 48 years     Sex: Male     Is Non-Hispanic African American: Yes     Diabetic: No     Tobacco smoker: Yes     Systolic Blood Pressure: 132 mmHg     Is BP treated: No     HDL Cholesterol: 54 mg/dL     Total Cholesterol: 164 mg/dL  No current statin therapy, next appointment note updated.   Emer Onnen, BSN, RN

## 2023-08-13 ENCOUNTER — Encounter: Payer: Self-pay | Admitting: *Deleted

## 2023-08-25 ENCOUNTER — Telehealth: Payer: Self-pay

## 2023-08-25 NOTE — Telephone Encounter (Signed)
 Patient called office c/o cough/ congestion that has gotten worse since recent trip to [facility]. States this has been going on since April, but has gotten worse since this past weekend. Would like recommendations on over the counter medication he  can take to help with symptom that dont interact with HIV medication. Did recommend patient reach out to PCP for evaluation since he is not feeling well.  Lorenda CHRISTELLA Code, RMA

## 2023-08-26 ENCOUNTER — Ambulatory Visit: Payer: Self-pay

## 2023-08-26 NOTE — Telephone Encounter (Signed)
 Spoke with patient. States he has tried OTC options and has not had much relief. Recommended contacting PCP or virtual appt  regarding cold Rodney Chase Code, RMA

## 2023-08-26 NOTE — Telephone Encounter (Signed)
 OTC Medications:  Decongestants - helps relieve congestion  Flonase  (generic fluticasone ) or Nasacort  (generic triamcinolone ) - please make sure to use the cross-over technique at a 45 degree angle towards the opposite eye as opposed to straight up the nasal passageway.  Sudafed (generic pseudoephedrine - Note this is the one that is available behind the pharmacy counter); Products with phenylephrine (-PE) may also be used but is often not as effective as pseudoephedrine.    Allergies - helps relieve runny nose, itchy eyes and sneezing  Claritin (generic loratidine), Allegra (fexofenidine), or Zyrtec (generic cyrterizine) for runny nose. These medications should not cause drowsiness. Note - Benadryl (generic diphenhydramine) may be used however may cause drowsiness  Cough -  Delsym or Robitussin (generic dextromethorphan)  Expectorants - helps loosen mucus to ease removal  Mucinex  (generic guaifenesin ) as directed on the package.  Headaches / General Aches  Tylenol  (generic acetaminophen ) - DO NOT EXCEED 3 grams (3,000 mg) in a 24 hour time period Advil /Motrin  (generic ibuprofen )   Sore Throat -  Salt water gargle  Chloraseptic (generic benzocaine) spray or lozenges / Sucrets (generic dyclonine)

## 2023-08-26 NOTE — Telephone Encounter (Signed)
 FYI Only or Action Required?: Action required by provider: request for appointment.  Patient was last seen in primary care on 01/19/2023 by Rodney Leash, MD. Called Nurse Triage reporting Back Pain. Symptoms began several months ago. Interventions attempted: Nothing. Symptoms are: unchanged.  Triage Disposition: See Physician Within 24 Hours, See PCP When Office is Open (Within 3 Days)  Patient/caregiver understands and will follow disposition?: Yes, will follow disposition  Copied from CRM [id number]. Topic: Clinical - Red Word Triage >> Aug 26, 2023  2:39 PM Fredrica W wrote: Red Word that prompted transfer to Nurse Triage: Pain all over back, hips down leg and bad cough Reason for Disposition  [1] MODERATE back pain (e.g., interferes with normal activities) AND [2] present > 3 days  SEVERE coughing spells (e.g., whooping sound after coughing, vomiting after coughing)  Answer Assessment - Initial Assessment Questions 1. ONSET: When did the pain begin?      Intermittent for a week 2. LOCATION: Where does it hurt? (upper, mid or lower back)     Lower back 3. SEVERITY: How bad is the pain?  (e.g., Scale 1-10; mild, moderate, or severe)   - MILD (1-3): Doesn't interfere with normal activities.    - MODERATE (4-7): Interferes with normal activities or awakens from sleep.    - SEVERE (8-10): Excruciating pain, unable to do any normal activities.      8 4. PATTERN: Is the pain constant? (e.g., yes, no; constant, intermittent)      intermittent 5. RADIATION: Does the pain shoot into your legs or somewhere else?     Leg/hip 6. CAUSE:  What do you think is causing the back pain?      unsure 8. MEDICINES: What have you taken so far for the pain? (e.g., nothing, acetaminophen , NSAIDS)     Denies trying anything 9. NEUROLOGIC SYMPTOMS: Do you have any weakness, numbness, or problems with bowel/bladder control?     denies 10. OTHER SYMPTOMS: Do you have any other  symptoms? (e.g., fever, abdomen pain, burning with urination, blood in urine)       Hip and leg pain, cough-not productive  Answer Assessment - Initial Assessment Questions 1. ONSET: When did the cough begin?      Since Jan 3. SPUTUM: Describe the color of your sputum (none, dry cough; clear, white, yellow, green)     denies 4. HEMOPTYSIS: Are you coughing up any blood? If so ask: How much? (flecks, streaks, tablespoons, etc.)     denies 5. DIFFICULTY BREATHING: Are you having difficulty breathing? If Yes, ask: How bad is it? (e.g., mild, moderate, severe)    - MILD: No SOB at rest, mild SOB with walking, speaks normally in sentences, can lie down, no retractions, pulse < 100.    - MODERATE: SOB at rest, SOB with minimal exertion and prefers to sit, cannot lie down flat, speaks in phrases, mild retractions, audible wheezing, pulse 100-120.    - SEVERE: Very SOB at rest, speaks in single words, struggling to breathe, sitting hunched forward, retractions, pulse > 120      mild 6. FEVER: Do you have a fever? If Yes, ask: What is your temperature, how was it measured, and when did it start?     denies 9. PE RISK FACTORS: Do you have a history of blood clots? (or: recent major surgery, recent prolonged travel, bedridden)     denies 10. OTHER SYMPTOMS: Do you have any other symptoms? (e.g., runny nose, wheezing, chest pain)  Runny nose  Protocols used: Back Pain-A-AH, Cough - Acute Non-Productive-A-AH

## 2023-08-26 NOTE — Telephone Encounter (Signed)
 Called pt who stated having back/legs/hips pain x 1-2 weeks. Also a cough; worse since he came back from the beach. Agreeable to schedule an appt - Appt schedule for 09/03/23.

## 2023-09-03 ENCOUNTER — Encounter

## 2023-09-08 ENCOUNTER — Telehealth

## 2023-09-08 ENCOUNTER — Encounter

## 2023-09-08 NOTE — Progress Notes (Signed)
 Patient was scheduled for an in-office visit which was then switched to Telehealth video visit today through the call center, but did not show up for either visit. Attempted to call patient but phone when straight to VM 2x.

## 2023-09-08 NOTE — Progress Notes (Deleted)
   Acute Office Visit  Subjective:     Patient ID: Rodney Chase, male    DOB: [birth year], 48 y.o.   MRN: [id number]  No chief complaint on file.  Rodney Chase is a 48 yr old male with a history of HIV (on Biktarvy ) who presents to clinic today for ***.     ***ROS: Denies headaches, dizziness, fever, chills, runny nose, sore throat, vision changes, hearing changes, chest pain, shortness of breath, difficulty breathing, nausea, vomiting, abdominal pain. Denies increased urinary frequency, pain with urination, constipation or diarrhea. No recent falls.       Objective:    There were no vitals taken for this visit. {Vitals History (Optional):23777}  Physical Exam:   Constitutional: well-appearing *** sitting in exam chair, in no acute distress. Ambulates without use of assistance device *** HEENT: normocephalic atraumatic, mucous membranes moist Eyes: conjunctiva non-erythematous Neck: supple Cardiovascular: regular rate and rhythm, bilateral radial pulses 2+, bilateral dorsal pedal pulses 2+, brisk capillary refill bilateral feet and hands  Pulmonary/Chest: normal work of breathing on room air, lungs clear to auscultation bilaterally Abdominal: soft, non-tender, non-distended MSK: normal bulk and tone. Neurological: alert & oriented x 3, sensation intact bilateral feet to monofilament*** Skin: warm and dry, no ulcers or lesions on bilateral feet*** Psych: mood calm, behavior normal, thought content normal, judgement normal    No results found for any visits on 09/08/23.      Assessment & Plan:   Problem List Items Addressed This Visit   None   No orders of the defined types were placed in this encounter.   No follow-ups on file.    Patient discussed with Dr. {imcattendings:[id number]}, who also saw and evaluated the patient.  Doyal Miyamoto, MD [facility]  PGY-1  09/08/23, 8:44 AM

## 2023-09-09 ENCOUNTER — Telehealth: Payer: Self-pay

## 2023-09-09 NOTE — Telephone Encounter (Signed)
 Rodney Chase called to provide an update. 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 He's been having some pain, so they placed a referral for further evaluation for potential sciatica.   He mentioned that during the HRA, they found a small little ball near his colon. States he would like to get scheduled for a colonoscopy.   Esthefany Herrig, BSN, RN

## 2023-09-09 NOTE — Progress Notes (Addendum)
 General  Date/Time: 09/09/2023 1:20 PM  Performed by: Amado Quince Gearing, NP Authorized by: Amado Quince Gearing, NP  Preparation: Patient was prepped and draped in the usual sterile fashion. Local anesthesia used: yes  Anesthesia: Local anesthesia used: yes Patient tolerance: patient tolerated the procedure well with no immediate complications    High Resolution Anoscopy Procedure Note  Rodney Chase is here for High resolution anoscopy (HRA). The patient has the following anal symptoms: none.  Referred to HRA clinic from our [facility].  Provider Cordella July, NP.  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 Last seen in HRA clinic in 06/2023. 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  History of Present Illness The patient presents today with complaints of buttocks and waist pain that radiates down the left leg. This has been going on since June 2025. Of note, his procedure was done on 07/01/2023. He felt that he needed to come in to be seen sooner for his HRA procedure because he felt the symptoms were related to his pain that started June 2025. He was supposed to see his PCP via virtual visit 2 days ago, but he states that he forgot and missed that visit. His plan was to talk to them about his symptoms. He describes pain that starts in his buttocks area and radiates down the left leg. He also notes when he is driving, he experiences the pain as well. Pain does not occur everyday. He has not tried anything to help with the pain or the numbness. He has not taken anything over the counter for his symptoms. He has not sought any treatment for the pain or associated numbness. 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 He is agreeable to have an x-ray done of his lumbar spine, but prefers to  have it done at [facility]. He is not aware of the name of his primary care provider, but he will reach back out to ID clinic to inform us  of this so that we can communicate the need for further workup.   Allergies: Iodine: No Lidocaine : No Anticoagulants: none  Anoscopy Medical History: Currently on ART: Yes-Biktarvy  Adherent to ART:  Yes Rodney Chase is a smoker Previous history of a STI: not addressed today. Previous Anal Procedures: No Last Anal Pap: 04/02/23   Last Anal Pap Result: ASCUS, other HR HPV types +  Sexual History: Sex with: Men Has patient ever had anal receptive intercourse: Yes Oral condyloma:  No Condyloma on genital: No  HRA Procedure Notes:  Risks/benefits/alternatives were discussed and the patient gave signed informed consent.  Following written informed consent a pre-procedure time out was performed.    The patient was placed in the left lateral decubitus position. The anus was treated with 5% lidocaine  ointment and lubricant. DARE performed and was normal. The anoscope was inserted and then an acetic acid soaked gauze was placed through the lumen to allow contact with the mucosa. The acetic acid was withdrawn after 2 minutes and the anoscope was reinserted.  Iodine(Lugol's)was used to further highlight lesions.  Biopsies were not taken. hemostasis was obtained. Anesthesia used: Yes - local infiltration; topical anesthetic; Anesthetic total: 2 mL  We reviewed post-procedure precautions, including nothing per-rectum until bleeding stops, and that it is expected to have some spotting on  toilet paper for a few days.  We also discussed red flag symptoms including fevers, severe pain, nausea/vomiting.  The patient will contact the clinic immediately and/or head to the ER if any of these occur.   Results: Procedure Summary: The patient tolerated the procedure well. or The HRA was adequate.  External HRA Findings:  No external lesions requiring ablation or biopsy  were seen   Internal HRA Findings: Location 1: Posterior, Acetowhite positive, Lugols positive, Findings No abnormal vasculature, Biopsy taken: No   Assessment and Plan:  1. Anal dysplasia (Primary) - General - General - lidocaine -prilocaine (EMLA) 2.5-2.5 % cream  2. Sciatica, unspecified laterality Onset in 08/2023. Unclear etiolgy, but explained to patient that this is unlikely due to ablation done in 06/2023 given onset of symptoms and presentation.Does not know name of PCP, but will call back to ID clinic to give that information. Advised that he follow-up with them for further evaluation.  Obtain imaging, but may need additional imaging such as MRI or CT. Can be done by PCP Trial of Prednisone dosepak to help with pain  - XR Spine Lumbar 2-3 Views - predniSONE (STERAPRED DS) 10 mg DsPk dose pack; Use As Directed On Package.  Dispense: 21 tablet; Refill: 0  HRA Follow-up:  6 months    Amado Quince Gearing  September 09, 2023

## 2023-09-10 ENCOUNTER — Telehealth: Payer: Self-pay | Admitting: *Deleted

## 2023-09-10 ENCOUNTER — Other Ambulatory Visit (HOSPITAL_BASED_OUTPATIENT_CLINIC_OR_DEPARTMENT_OTHER): Payer: Self-pay | Admitting: Adult Health

## 2023-09-10 DIAGNOSIS — M5386 Other specified dorsopathies, lumbar region: Secondary | ICD-10-CM

## 2023-09-10 NOTE — Telephone Encounter (Signed)
 Pt stated he needs a physical and be tested for cancer mainly prostate . Stated his brother recently dx.  Stated having generalized pain and coughing. He has been schedule to see Dr Nguyen Wednesday 7/16.

## 2023-09-10 NOTE — Telephone Encounter (Signed)
 Copied from CRM [id number]. Topic: Clinical - Medical Advice >> Sep 10, 2023  1:41 PM Mercer PEDLAR wrote: Reason for CRM: Patient requested cancer testing to be done and would like a callback to check if it will be done during appointment scheduled on 09/14/23. He stated that he is having similar symptoms to what his brother had when he had prostate cancer and that cancer runs in his family.  Callback number: [phone]

## 2023-09-11 NOTE — Telephone Encounter (Signed)
 3rd attempt, Attempted to reach the patient by phone, no answer, left a HIPPA compliant message to call back to triage.   Letter sent to address on file.

## 2023-09-14 ENCOUNTER — Ambulatory Visit: Payer: BLUE CROSS/BLUE SHIELD | Admitting: Family

## 2023-09-16 NOTE — Progress Notes (Deleted)
 Complete physical exam  Patient: Rodney Chase   DOB: Jun 25, 1975   48 y.o. Male  MRN: [id number]  Subjective:    No chief complaint on file.   [name] is a 48 y.o. male with a history of HIV (on Biktarvy , 03/2023 CD4 was 1027, and nondetectable), who presents today for a complete physical exam. 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 At that visit with HRA specialist NP Amado Gearing he had reported sciatica symptoms including *** which had began in June 2025. She ordered a lumbar X-ray and sent a prescription for a Prednisone 10 mg dose pack which he reports *** his symptoms. At this time, it appears that he has not had the lumbar X-ray performed yet.   He reports consuming a {diet types:17450} diet. {types:19826} He generally feels {DESC; WELL/FAIRLY WELL/POORLY:18703}. He reports sleeping {DESC; WELL/FAIRLY WELL/POORLY:18703}. He {does/does not:200015} have additional problems to discuss today.    Most recent fall risk assessment:    11/04/2022   10:40 AM  Fall Risk   Falls in the past year? 0  Number falls in past yr: 0  Injury with Fall? 0     Most recent depression screenings:    01/19/2023    2:48 PM 11/04/2022   10:40 AM  PHQ 2/9 Scores  PHQ - 2 Score 2 0  PHQ- 9 Score 8     {VISON DENTAL STD PSA (Optional):27386}  {History (Optional):23778}  Patient Care Team: D'Mello, Rosalyn, DO as PCP - General Eben Reyes BROCKS, MD as PCP - Infectious Diseases (Infectious Diseases) Lavona Agent, MD as PCP - Cardiology (Cardiology) Tobie Tonita POUR, DO as Consulting Physician (Neurology)   Outpatient Medications Prior to Visit  Medication Sig   bictegravir-emtricitabine -tenofovir  AF (BIKTARVY ) 50-200-25 MG TABS tablet Take 1 tablet by mouth daily.   Ensure (ENSURE) Take 237 mLs by  mouth 2 (two) times daily between meals.   methocarbamol  (ROBAXIN ) 500 MG tablet Take 1 tablet (500 mg total) by mouth at bedtime as needed for muscle spasms.   ondansetron  (ZOFRAN -ODT) 4 MG disintegrating tablet Take 1 tablet (4 mg total) by mouth every 8 (eight) hours as needed for nausea or vomiting.   traMADol  (ULTRAM ) 50 MG tablet Take 1 tablet (50 mg total) by mouth every 12 (twelve) hours as needed.   No facility-administered medications prior to visit.    ***ROS: Denies headaches, dizziness, fever, chills, runny nose, sore throat, vision changes, hearing changes, chest pain, shortness of breath, difficulty breathing, nausea, vomiting, abdominal pain. Denies increased urinary frequency, pain with urination, constipation or diarrhea. No recent falls.       Objective:     There were no vitals taken for this visit. {Vitals History (Optional):23777}  Physical Exam:   Constitutional: well-appearing *** sitting in exam chair, in no acute distress. Ambulates without use of assistance device *** HEENT: normocephalic atraumatic, mucous membranes moist Eyes: conjunctiva non-erythematous Neck: supple Cardiovascular: regular rate and rhythm, bilateral radial pulses 2+, bilateral dorsal pedal pulses 2+, brisk capillary refill bilateral feet and hands  Pulmonary/Chest: normal work of breathing on room air, lungs clear to auscultation bilaterally Abdominal: soft, non-tender, non-distended MSK: normal bulk and tone. Neurological: alert & oriented x 3, sensation intact bilateral feet to monofilament*** Skin: warm and dry, no ulcers or lesions on bilateral feet*** Psych: mood  calm, behavior normal, thought content normal, judgement normal    No results found for any visits on 09/16/23. {Show previous labs (optional):23779}    Assessment & Plan:    Routine Health Maintenance and Physical Exam  Immunization History  Administered Date(s) Administered   H1N1 03/28/2008   Hepatitis A  02/03/2006   Hepatitis A, Adult 11/15/2012   Hepatitis B 02/03/2006, 03/20/2006, 07/29/2006   Hepatitis B, ADULT 02/20/2014, 03/21/2014, 11/01/2014   Influenza Split 02/04/2011, 04/26/2012   Influenza Whole 02/03/2006, 01/22/2007, 03/28/2008, 01/17/2009, 12/28/2009   Influenza,inj,Quad PF,6+ Mos 11/15/2012, 10/19/2013, 11/01/2014   PNEUMOCOCCAL CONJUGATE-20 05/23/2021   PPD Test 04/01/2010   Pneumococcal Polysaccharide-23 02/03/2006, 02/04/2011   Tdap 07/02/2019, 07/16/2020    Health Maintenance  Topic Date Due   COVID-19 Vaccine (1) Never done   Colonoscopy  Never done   INFLUENZA VACCINE  10/02/2023   DTaP/Tdap/Td (3 - Td or Tdap) 07/17/2030   Pneumococcal Vaccine 89-84 Years old  Completed   Hepatitis B Vaccines  Completed   Hepatitis C Screening  Completed   HIV Screening  Completed   HPV VACCINES  Aged Out   Meningococcal B Vaccine  Aged Out    Discussed health benefits of physical activity, and encouraged him to engage in regular exercise appropriate for his age and condition.  Problem List Items Addressed This Visit   None  No follow-ups on file.     Patient discussed with Dr. {imcattendings:[id number]}, who also saw and evaluated the patient.  Doyal Miyamoto, MD [facility]  PGY-1  09/16/23, 8:38 AM

## 2023-09-21 ENCOUNTER — Telehealth: Payer: Self-pay

## 2023-09-21 NOTE — Progress Notes (Deleted)
 Complete physical exam  Patient: Rodney Chase   DOB: Jun 25, 1975   48 y.o. Male  MRN: [id number]  Subjective:    No chief complaint on file.   Rodney Chase is a 48 y.o. male with a history of HIV (on Biktarvy , 03/2023 CD4 was 1027, and nondetectable), who presents today for a complete physical exam. 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 At that visit with HRA specialist NP Amado Gearing he had reported sciatica symptoms including *** which had began in June 2025. She ordered a lumbar X-ray and sent a prescription for a Prednisone 10 mg dose pack which he reports *** his symptoms. At this time, it appears that he has not had the lumbar X-ray performed yet.   He reports consuming a {diet types:17450} diet. {types:19826} He generally feels {DESC; WELL/FAIRLY WELL/POORLY:18703}. He reports sleeping {DESC; WELL/FAIRLY WELL/POORLY:18703}. He {does/does not:200015} have additional problems to discuss today.    Most recent fall risk assessment:    11/04/2022   10:40 AM  Fall Risk   Falls in the past year? 0  Number falls in past yr: 0  Injury with Fall? 0     Most recent depression screenings:    01/19/2023    2:48 PM 11/04/2022   10:40 AM  PHQ 2/9 Scores  PHQ - 2 Score 2 0  PHQ- 9 Score 8     {VISON DENTAL STD PSA (Optional):27386}  {History (Optional):23778}  Patient Care Team: D'Mello, Rosalyn, DO as PCP - General Eben Reyes BROCKS, MD as PCP - Infectious Diseases (Infectious Diseases) Lavona Agent, MD as PCP - Cardiology (Cardiology) Tobie Tonita POUR, DO as Consulting Physician (Neurology)   Outpatient Medications Prior to Visit  Medication Sig   bictegravir-emtricitabine -tenofovir  AF (BIKTARVY ) 50-200-25 MG TABS tablet Take 1 tablet by mouth daily.   Ensure (ENSURE) Take 237 mLs by  mouth 2 (two) times daily between meals.   methocarbamol  (ROBAXIN ) 500 MG tablet Take 1 tablet (500 mg total) by mouth at bedtime as needed for muscle spasms.   ondansetron  (ZOFRAN -ODT) 4 MG disintegrating tablet Take 1 tablet (4 mg total) by mouth every 8 (eight) hours as needed for nausea or vomiting.   traMADol  (ULTRAM ) 50 MG tablet Take 1 tablet (50 mg total) by mouth every 12 (twelve) hours as needed.   No facility-administered medications prior to visit.    ***ROS: Denies headaches, dizziness, fever, chills, runny nose, sore throat, vision changes, hearing changes, chest pain, shortness of breath, difficulty breathing, nausea, vomiting, abdominal pain. Denies increased urinary frequency, pain with urination, constipation or diarrhea. No recent falls.       Objective:     There were no vitals taken for this visit. {Vitals History (Optional):23777}  Physical Exam:   Constitutional: well-appearing *** sitting in exam chair, in no acute distress. Ambulates without use of assistance device *** HEENT: normocephalic atraumatic, mucous membranes moist Eyes: conjunctiva non-erythematous Neck: supple Cardiovascular: regular rate and rhythm, bilateral radial pulses 2+, bilateral dorsal pedal pulses 2+, brisk capillary refill bilateral feet and hands  Pulmonary/Chest: normal work of breathing on room air, lungs clear to auscultation bilaterally Abdominal: soft, non-tender, non-distended MSK: normal bulk and tone. Neurological: alert & oriented x 3, sensation intact bilateral feet to monofilament*** Skin: warm and dry, no ulcers or lesions on bilateral feet*** Psych: mood  calm, behavior normal, thought content normal, judgement normal    No results found for any visits on 09/16/23. {Show previous labs (optional):23779}    Assessment & Plan:    Routine Health Maintenance and Physical Exam  Immunization History  Administered Date(s) Administered   H1N1 03/28/2008   Hepatitis A  02/03/2006   Hepatitis A, Adult 11/15/2012   Hepatitis B 02/03/2006, 03/20/2006, 07/29/2006   Hepatitis B, ADULT 02/20/2014, 03/21/2014, 11/01/2014   Influenza Split 02/04/2011, 04/26/2012   Influenza Whole 02/03/2006, 01/22/2007, 03/28/2008, 01/17/2009, 12/28/2009   Influenza,inj,Quad PF,6+ Mos 11/15/2012, 10/19/2013, 11/01/2014   PNEUMOCOCCAL CONJUGATE-20 05/23/2021   PPD Test 04/01/2010   Pneumococcal Polysaccharide-23 02/03/2006, 02/04/2011   Tdap 07/02/2019, 07/16/2020    Health Maintenance  Topic Date Due   COVID-19 Vaccine (1) Never done   Colonoscopy  Never done   INFLUENZA VACCINE  10/02/2023   DTaP/Tdap/Td (3 - Td or Tdap) 07/17/2030   Pneumococcal Vaccine 89-84 Years old  Completed   Hepatitis B Vaccines  Completed   Hepatitis C Screening  Completed   HIV Screening  Completed   HPV VACCINES  Aged Out   Meningococcal B Vaccine  Aged Out    Discussed health benefits of physical activity, and encouraged him to engage in regular exercise appropriate for his age and condition.  Problem List Items Addressed This Visit   None  No follow-ups on file.     Patient discussed with Dr. {imcattendings:[id number]}, who also saw and evaluated the patient.  Doyal Miyamoto, MD [facility]  PGY-1  09/16/23, 8:38 AM

## 2023-09-21 NOTE — Telephone Encounter (Signed)
 Patient was contacted via telephone to reschedule the appointment he no showed today.  Phone call was answered, but then disconnected on receiver's side.  Will send patient a no show letter via mail.

## 2023-09-22 ENCOUNTER — Ambulatory Visit

## 2023-09-22 VITALS — BP 121/78 | HR 94 | Temp 98.1°F | Ht 64.0 in | Wt 121.4 lb

## 2023-09-22 DIAGNOSIS — Z8042 Family history of malignant neoplasm of prostate: Secondary | ICD-10-CM | POA: Diagnosis not present

## 2023-09-22 DIAGNOSIS — M545 Low back pain, unspecified: Secondary | ICD-10-CM | POA: Insufficient documentation

## 2023-09-22 DIAGNOSIS — M5440 Lumbago with sciatica, unspecified side: Secondary | ICD-10-CM | POA: Diagnosis present

## 2023-09-22 NOTE — Patient Instructions (Addendum)
 Thank you, [name] for allowing us  to provide your care today. Today we discussed the following:  - For your back pain, I recommend taking Aleve  200 mg, two tablets in the morning and one tablet in the evening for about 3-7 days to help with your pain. You may also try an over the counter Lidocaine  patch or IcyHot - I will let you know about your PSA results this week - If you want to get your back X-ray completed, you may    I have ordered the following labs for you:   Lab Orders         PSA       Should you have any questions or concerns please call the Internal Medicine Clinic at [phone].     Doyal Miyamoto, MD [facility]

## 2023-09-22 NOTE — Progress Notes (Deleted)
 Complete physical exam  Patient: Rodney Chase   DOB: 05-09-1975   48 y.o. Male  MRN: [id number]  Subjective:    No chief complaint on file.   Khalel Alms is a 48 y.o. male with a history of HIV (on Biktarvy , 03/2023 CD4 was 1027, and nondetectable), who presents today for a complete physical exam. 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 At that visit with HRA specialist NP Amado Gearing he had reported sciatica symptoms including *** which had began in June 2025. She ordered a lumbar X-ray and sent a prescription for a Prednisone 10 mg dose pack which he reports *** his symptoms. At this time, it appears that he has not had the lumbar X-ray performed yet.   - smoking quarter PPD since high school  - did not pick up the prednisone, did not get lumbar X-ray - twin brother diagnosed in December with prostate cancer    He reports consuming a {diet types:17450} diet. {types:19826} He generally feels {DESC; WELL/FAIRLY WELL/POORLY:18703}. He reports sleeping {DESC; WELL/FAIRLY WELL/POORLY:18703}. He {does/does not:200015} have additional problems to discuss today.    Most recent fall risk assessment:    11/04/2022   10:40 AM  Fall Risk   Falls in the past year? 0  Number falls in past yr: 0  Injury with Fall? 0     Most recent depression screenings:    01/19/2023    2:48 PM 11/04/2022   10:40 AM  PHQ 2/9 Scores  PHQ - 2 Score 2 0  PHQ- 9 Score 8     {VISON DENTAL STD PSA (Optional):27386}  {History (Optional):23778}  Patient Care Team: D'Mello, Rosalyn, DO as PCP - General Eben Reyes BROCKS, MD as PCP - Infectious Diseases (Infectious Diseases) Lavona Agent, MD as PCP - Cardiology (Cardiology) Tobie Tonita POUR, DO as Consulting Physician (Neurology)   Outpatient Medications Prior to  Visit  Medication Sig   bictegravir-emtricitabine -tenofovir  AF (BIKTARVY ) 50-200-25 MG TABS tablet Take 1 tablet by mouth daily.   Ensure (ENSURE) Take 237 mLs by mouth 2 (two) times daily between meals.   methocarbamol  (ROBAXIN ) 500 MG tablet Take 1 tablet (500 mg total) by mouth at bedtime as needed for muscle spasms.   ondansetron  (ZOFRAN -ODT) 4 MG disintegrating tablet Take 1 tablet (4 mg total) by mouth every 8 (eight) hours as needed for nausea or vomiting.   traMADol  (ULTRAM ) 50 MG tablet Take 1 tablet (50 mg total) by mouth every 12 (twelve) hours as needed.   No facility-administered medications prior to visit.    ***ROS: Denies headaches, dizziness, fever, chills, runny nose, sore throat, vision changes, hearing changes, chest pain, shortness of breath, difficulty breathing, nausea, vomiting, abdominal pain. Denies increased urinary frequency, pain with urination, constipation or diarrhea. No recent falls.       Objective:     There were no vitals taken for this visit. {Vitals History (Optional):23777}  Physical Exam:   Constitutional: well-appearing *** sitting in exam chair, in no acute distress. Ambulates without use of assistance device *** HEENT: normocephalic atraumatic, mucous membranes moist Eyes: conjunctiva non-erythematous Neck: supple Cardiovascular: regular rate and rhythm, bilateral radial pulses 2+, bilateral dorsal pedal pulses 2+, brisk capillary refill bilateral feet and hands  Pulmonary/Chest: normal work of breathing on room air, lungs clear to auscultation bilaterally Abdominal: soft,  non-tender, non-distended MSK: normal bulk and tone. Neurological: alert & oriented x 3, sensation intact bilateral feet to monofilament*** Skin: warm and dry, no ulcers or lesions on bilateral feet*** Psych: mood calm, behavior normal, thought content normal, judgement normal    No results found for any visits on 09/16/23. {Show previous labs (optional):23779}     Assessment & Plan:    Routine Health Maintenance and Physical Exam  Immunization History  Administered Date(s) Administered   H1N1 03/28/2008   Hepatitis A 02/03/2006   Hepatitis A, Adult 11/15/2012   Hepatitis B 02/03/2006, 03/20/2006, 07/29/2006   Hepatitis B, ADULT 02/20/2014, 03/21/2014, 11/01/2014   Influenza Split 02/04/2011, 04/26/2012   Influenza Whole 02/03/2006, 01/22/2007, 03/28/2008, 01/17/2009, 12/28/2009   Influenza,inj,Quad PF,6+ Mos 11/15/2012, 10/19/2013, 11/01/2014   PNEUMOCOCCAL CONJUGATE-20 05/23/2021   PPD Test 04/01/2010   Pneumococcal Polysaccharide-23 02/03/2006, 02/04/2011   Tdap 07/02/2019, 07/16/2020    Health Maintenance  Topic Date Due   COVID-19 Vaccine (1) Never done   Colonoscopy  Never done   INFLUENZA VACCINE  10/02/2023   DTaP/Tdap/Td (3 - Td or Tdap) 07/17/2030   Pneumococcal Vaccine 70-58 Years old  Completed   Hepatitis B Vaccines  Completed   Hepatitis C Screening  Completed   HIV Screening  Completed   HPV VACCINES  Aged Out   Meningococcal B Vaccine  Aged Out    Discussed health benefits of physical activity, and encouraged him to engage in regular exercise appropriate for his age and condition.  Problem List Items Addressed This Visit   None  No follow-ups on file.     Patient discussed with Dr. {imcattendings:33109}, who also saw and evaluated the patient.  Doyal Miyamoto, MD [facility]  PGY-1  09/16/23, 8:38 AM

## 2023-09-22 NOTE — Assessment & Plan Note (Addendum)
 Patient presents to clinic with ~ 1 month history of lower back and buttocks pain that travels down bilateral posterior legs.  Pain is increased when sitting for long periods of time.  Patient has a significant family history of prostate cancer including his father and twin brother. Patient is concerned about whether he may also have prostate cancer given the symptoms that his brother presented with in December 2024 of low back pain without urinary symptom involvement that led to diagnosis of his prostate cancer.  Based on his history and on physical exam, symptoms do appear consistent with sciatica.  However given patient's significant family history of prostate cancer, we will order a PSA today.  Discussed with patient that if PSA is not elevated, then low back pain is more likely attributed to sciatica. Patient does have a prior PSA in 2014 of 0.44.  In the meantime for his pain we did discuss use of lidocaine  patches and IcyHot cream or similar.  Given his normal kidney function, we also discussed use of a short course of over-the-counter NSAIDs such as Aleve .  I have provided him with a home exercise and stretching PDF for sciatica.  We will follow-up with him about his PSA results and proceed with a low threshold to refer to urology if they are elevated.  If his PSA is normal, and his symptoms do not improve with this conservative management, we discussed possible future referral to physical therapy or sports medicine.  He did not pick up or take the prednisone prescription that NP Darrick Gearing prescribed him, but prednisone dose pack could also be considered if symptoms do not improve in the future. - f/u PSA results - OTC Aleve  short course, lidocaine  patches, IcyHot cream - Provided sciatica stretches PDF - Consider possible future referral to Physical Therapy if symptoms do not improve with conservative management as mentioned above

## 2023-09-22 NOTE — Progress Notes (Signed)
 Patient name: Rodney Chase Date of birth: [birth year] Date of visit: 09/22/23  Type of visit: Established Patient Office Visit   Subjective   Chief concern:  Low back pain   Rodney Chase is a 48 y.o. male with a history of HIV (on Biktarvy , 03/2023 CD4 was 1027, and nondetectable), who presents today for low back pain and prostate cancer workup. 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 At that visit with HRA specialist NP Amado Gearing he had reported sciatica symptoms including pain with sitting long periods of time, with pain that radiates down the back of his leg which had began in June 2025. She ordered a lumbar X-ray and sent a prescription for a Prednisone 10 mg dose pack which he reports that he did not take, nor did he obtain the back X-ray.   Today he presents to office with concerns of the same lower back pain.  He does report a significant family history of prostate cancer, including his father and twin brother. He states that his twin brother was recently diagnosed in December 2024 with prostate cancer after presenting with symptoms of low back pain.  At that time his brother did not endorse any urinary symptoms but metastasis to his spine was discovered following low back workup. Patient denies difficulty with urinating pain with urination difficulty initiating a urinary stream.  He endorses low back and buttocks discomfort that worsens upon sitting for long periods of time with pain that travels down the back of both legs.  He denies numbness or tingling in bilateral feet.  He has not tried anything for the pain.  He would like to initiate workup to rule out prostate cancer.   ROS: Denies headaches, dizziness, fever, chills, runny nose, sore throat, vision changes, hearing changes, chest pain,  shortness of breath, difficulty breathing, nausea, vomiting, abdominal pain. Denies increased urinary frequency, pain with urination, constipation or diarrhea. No recent falls.  He endorses recent low back and buttocks pain.    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/13/2022   Therapeutic drug monitoring 09/17/2021   GAD (generalized anxiety disorder) 11/22/2020   Healthcare maintenance 06/21/2019   Human immunodeficiency virus (HIV) disease (HCC) 03/27/2006    Social History   Tobacco Use   Smoking status: Every Day    Current packs/day: 0.30    Types: Cigarettes   Smokeless tobacco: Never  Vaping Use   Vaping status: Never Used  Substance Use Topics   Alcohol use: Yes    Comment: occasionally   Drug use: Yes    Types: Marijuana    Comment: daily    Patient Care Team: D'Mello, Rosalyn, DO as PCP - General Eben Reyes BROCKS, MD as PCP - Infectious Diseases (Infectious Diseases) Lavona Agent, MD as PCP - Cardiology (Cardiology) Tobie Tonita POUR, DO as Consulting Physician (Neurology)       Objective:       Objective  Today's Vitals   09/22/23 1028  BP: 121/78  Pulse: 94  Temp: 98.1 F (36.7 C)  TempSrc: Oral  SpO2: 100%  Weight: 121 lb 6.4 oz (55.1 kg)  Height: 5' 4 (1.626 m)  PainSc: 0-No pain  Body mass index is 20.84 kg/m.  Physical Exam:   Constitutional: well-appearing male sitting in exam chair, in no acute distress. Ambulates without use of assistance device  HEENT: normocephalic atraumatic, mucous membranes moist Eyes: conjunctiva non-erythematous Neck: supple Cardiovascular: regular rate and rhythm Pulmonary/Chest: normal work of breathing on room air MSK: normal bulk and tone; no mid-line spinal tenderness over thoracic or lumbar vertebrae; increased pain w/ SLR of buttocks and posterior leg  Neurological: alert & oriented x 3 Skin: warm and  dry Psych: mood calm, behavior normal, thought content normal, judgement normal    Last metabolic panel Lab Results  Component Value Date   GLUCOSE 93 04/02/2023   NA 138 04/02/2023   K 4.5 04/02/2023   CL 104 04/02/2023   CO2 29 04/02/2023   BUN 11 04/02/2023   CREATININE 1.01 04/02/2023   EGFR 93 04/02/2023   CALCIUM 9.5 04/02/2023   PROT 6.9 04/02/2023   ALBUMIN 3.7 07/08/2020   BILITOT 0.4 04/02/2023   ALKPHOS 39 07/08/2020   AST 16 04/02/2023   ALT 9 04/02/2023   ANIONGAP 7 07/08/2020      The 10-year ASCVD risk score (Arnett DK, et al., 2019) is: 6.2%   Values used to calculate the score:     Age: 83 years     Clincally relevant sex: Male     Is Non-Hispanic African American: Yes     Diabetic: No     Tobacco smoker: Yes     Systolic Blood Pressure: 121 mmHg     Is BP treated: No     HDL Cholesterol: 54 mg/dL     Total Cholesterol: 164 mg/dL      Assessment & Plan  Problem List Items Addressed This Visit       Other   Lower back pain   Patient presents to clinic with a 1 month history of lower back and buttocks pain that travels down bilateral posterior legs.  Pain is increased when sitting for long periods of time.  Patient has a significant family history of prostate cancer including his father and twin brother. Patient is concerned about whether he may also have prostate cancer given the symptoms that his brother presented with in December 2024 of low back pain without urinary symptom involvement that led to diagnosis of his prostate cancer.  Based on his history and on physical exam, symptoms do appear consistent with sciatica.  However given patient's significant family history of prostate cancer, we will order a PSA today.  Discussed with patient that if PSA is not elevated, then low back pain is more likely attributed to sciatica. Patient does have a prior PSA in 2014 of 0.44.  In the meantime for his pain we did discuss use of lidocaine  patches and IcyHot  cream or similar.  Given his normal kidney function, we also discussed use of a short course of over-the-counter NSAIDs such as Aleve .  I have provided him with a home exercise and stretching PDF for sciatica.  We will follow-up with him about his PSA results and proceed with a low threshold to refer to urology if they are elevated.  If his PSA is normal, and his symptoms do not improve with this conservative management, we discussed possible future referral to physical therapy or sports medicine.  He did not pick up or take the prednisone prescription that NP Darrick Gearing prescribed him, but prednisone dose pack could also be considered if symptoms do not improve in the future. - f/u PSA results - OTC Aleve  short  course, lidocaine  patches, IcyHot cream - Provided sciatica stretches PDF - Consider possible future referral to Physical Therapy if symptoms do not improve with conservative management as mentioned above      Other Visit Diagnoses       Family history of prostate cancer    -  Primary   Relevant Orders   PSA       Return if symptoms worsen or fail to improve.  Patient discussed with Dr. Lovie, who also saw and evaluated the patient.  Doyal Miyamoto, MD [facility]  PGY-1 09/22/2023, 3:44 PM

## 2023-09-23 ENCOUNTER — Ambulatory Visit: Payer: Self-pay

## 2023-09-23 LAB — PSA: Prostate Specific Ag, Serum: 0.7 ng/mL (ref 0.0–4.0)

## 2023-09-28 ENCOUNTER — Ambulatory Visit: Payer: Self-pay

## 2023-09-28 NOTE — Telephone Encounter (Signed)
RTC to patient.  Message left that the Clinics had returned his call. 

## 2023-09-28 NOTE — Telephone Encounter (Addendum)
  First attempt; no answer Second attempt; no answer Third attempt; no answer       Copied from CRM [id number]. Topic: Clinical - Medical Advice >> Sep 28, 2023  8:49 AM Merlynn A wrote: Reason for CRM: Patient called in requesting call back from nurse. Please contact patient at [phone].

## 2023-09-30 ENCOUNTER — Emergency Department (HOSPITAL_COMMUNITY)
Admission: EM | Admit: 2023-09-30 | Discharge: 2023-09-30 | Disposition: A | Discharge status: Home / Self Care | Attending: Emergency Medicine | Admitting: Emergency Medicine

## 2023-09-30 ENCOUNTER — Emergency Department (HOSPITAL_COMMUNITY)

## 2023-09-30 ENCOUNTER — Other Ambulatory Visit: Payer: Self-pay

## 2023-09-30 ENCOUNTER — Ambulatory Visit: Payer: Self-pay

## 2023-09-30 DIAGNOSIS — Z21 Asymptomatic human immunodeficiency virus [HIV] infection status: Secondary | ICD-10-CM | POA: Insufficient documentation

## 2023-09-30 DIAGNOSIS — F17210 Nicotine dependence, cigarettes, uncomplicated: Secondary | ICD-10-CM | POA: Insufficient documentation

## 2023-09-30 DIAGNOSIS — B349 Viral infection, unspecified: Secondary | ICD-10-CM | POA: Diagnosis not present

## 2023-09-30 DIAGNOSIS — J029 Acute pharyngitis, unspecified: Secondary | ICD-10-CM | POA: Diagnosis present

## 2023-09-30 LAB — TROPONIN I (HIGH SENSITIVITY): Troponin I (High Sensitivity): 3 ng/L (ref <18)

## 2023-09-30 LAB — COMPREHENSIVE METABOLIC PANEL WITH GFR
ALT: 51 U/L — ABNORMAL HIGH (ref 0–44)
AST: 39 U/L (ref 15–41)
Albumin: 3 g/dL — ABNORMAL LOW (ref 3.5–5.0)
Alkaline Phosphatase: 77 U/L (ref 38–126)
Anion gap: 9 (ref 5–15)
BUN: 11 mg/dL (ref 6–20)
CO2: 26 mmol/L (ref 22–32)
Calcium: 8.8 mg/dL — ABNORMAL LOW (ref 8.9–10.3)
Chloride: 103 mmol/L (ref 98–111)
Creatinine, Ser: 0.96 mg/dL (ref 0.61–1.24)
GFR, Estimated: >60 mL/min (ref >60)
Glucose, Bld: 100 mg/dL — ABNORMAL HIGH (ref 70–99)
Potassium: 4 mmol/L (ref 3.5–5.1)
Sodium: 138 mmol/L (ref 135–145)
Total Bilirubin: 0.6 mg/dL (ref 0.0–1.2)
Total Protein: 6.8 g/dL (ref 6.5–8.1)

## 2023-09-30 LAB — CBC WITH DIFFERENTIAL/PLATELET
Abs Immature Granulocytes: 0.02 K/uL (ref 0.00–0.07)
Basophils Absolute: 0 K/uL (ref 0.0–0.1)
Basophils Relative: 1 %
Eosinophils Absolute: 0.1 K/uL (ref 0.0–0.5)
Eosinophils Relative: 2 %
HCT: 40.6 % (ref 39.0–52.0)
Hemoglobin: 13 g/dL (ref 13.0–17.0)
Immature Granulocytes: 0 %
Lymphocytes Relative: 29 %
Lymphs Abs: 1.9 K/uL (ref 0.7–4.0)
MCH: 27.3 pg (ref 26.0–34.0)
MCHC: 32 g/dL (ref 30.0–36.0)
MCV: 85.1 fL (ref 80.0–100.0)
Monocytes Absolute: 0.6 K/uL (ref 0.1–1.0)
Monocytes Relative: 9 %
Neutro Abs: 3.9 K/uL (ref 1.7–7.7)
Neutrophils Relative %: 59 %
Platelets: 293 K/uL (ref 150–400)
RBC: 4.77 MIL/uL (ref 4.22–5.81)
RDW: 14.2 % (ref 11.5–15.5)
WBC: 6.6 K/uL (ref 4.0–10.5)
nRBC: 0 % (ref 0.0–0.2)

## 2023-09-30 LAB — RESP PANEL BY RT-PCR (RSV, FLU A&B, COVID)  RVPGX2
Influenza A by PCR: NEGATIVE
Influenza B by PCR: NEGATIVE
Resp Syncytial Virus by PCR: NEGATIVE
SARS Coronavirus 2 by RT PCR: NEGATIVE

## 2023-09-30 LAB — LIPASE, BLOOD: Lipase: 35 U/L (ref 11–51)

## 2023-09-30 LAB — GROUP A STREP BY PCR: Group A Strep by PCR: NOT DETECTED

## 2023-09-30 MED ORDER — ACETAMINOPHEN 500 MG PO TABS
1000.0000 mg | ORAL_TABLET | Freq: Once | ORAL | Status: AC
  Administered 2023-09-30: 1000 mg via ORAL
  Filled 2023-09-30: qty 2

## 2023-09-30 MED ORDER — IBUPROFEN 400 MG PO TABS
400.0000 mg | ORAL_TABLET | Freq: Once | ORAL | Status: AC
  Administered 2023-09-30: 400 mg via ORAL
  Filled 2023-09-30: qty 1

## 2023-09-30 NOTE — Discharge Instructions (Signed)
 Your blood work and x-rays were normal here today.  Follow-up with your primary care doctor.

## 2023-09-30 NOTE — Telephone Encounter (Signed)
 FYI Only or Action Required?: Action required by provider: refusing 911, heading to ED, requesting follow up with PCP, question about results.  Patient was last seen in primary care on 09/22/2023 by Leontine Lapine, MD.  Called Nurse Triage reporting Loss of Consciousness, Tachycardia, Jaw Pain, Arm Pain, Chest Pain, results question, Ear Pain, Sore Throat, Cough, possible SOB, Diarrhea, Vomiting, Dizziness, Weakness, Headache, and Anxiety.  Symptoms began several days ago.  Interventions attempted: OTC medications: aleve  and Other: put hot water down a straw into his ears.  Symptoms are: rapidly worsening.  Triage Disposition: Call EMS 911 Now  Patient/caregiver understands and will follow disposition?: No, refuses disposition       Copied from CRM [id number]. Topic: Clinical - Red Word Triage >> Sep 30, 2023  1:18 PM Carrielelia G wrote: Red Word that prompted transfer to Nurse Triage: passed out Sunday, Rapid heart beat (not his first episode) currently experiencing  jaw and ear pain and throat pain.   Reason for Disposition  Chest pain  Answer Assessment - Initial Assessment Questions Pt is poor historian  Getting aunt on line for conference call Wanting to discuss PSA results more as well as his symptoms 1. ONSET: How long were you unconscious? (e.g., minutes, seconds) When did it happen?     Passed out on Sunday, maybe 20 min, Sunday not long after church, was seated and did not fall 2. CONTENT: What happened during the period of unconsciousness? (e.g., seizure activity)      Just blackout type of thing, water and A/C brought out of me 4. TRIGGER: What do you think caused the fainting? What were you doing just before you fainted?  (e.g., exercise, sudden standing up, prolonged standing)     Sitting there talking to mechanic then felt that coming on 5. RECURRENT SYMPTOM: Have you ever passed out before? If Yes, ask: When was the last time? and What happened that  time?      Not first time it's happened Last happened February of last year, walking all of sudden dizzy, fatigue weak feeling anxiety then lose consciousness 6. INJURY: Did you hurt yourself when you fell?      no 7. CARDIAC SYMPTOMS: Have you had any of the following symptoms: chest pain, difficulty breathing, palpitations?     EKGs in past but find nothing each time, HTN 8. NEUROLOGIC SYMPTOMS: Have you had any of the following symptoms: headache, numbness, vertigo, weakness?     Dizziness, weakness Dizziness comes and goes, weakness is always there tiredness and fatigue always there Headaches come and go 9. GI SYMPTOMS: Have you had any of the following symptoms: abdomen pain, vomiting, diarrhea, blood in stools?     Vomited a little bit but nothing to come up, Saturday was last time Diarrhea since Thursday until yesterday, not looked at it since 10. OTHER SYMPTOMS: Do you have any other symptoms? Coughing heavy, cough makes me cough harder to open up airway a little bit No not really SOB Sciatic nerve thing going on       Chest pain Sleep on couch, pinpointed chest pain to that Still there moving arms Always achey from breastplate up, both arms and chest No chest pain today Only throat, ears hurt really bad, jaw pain 7-7.5/10, last night almost went to ER for pain, some time today think will pay a visit to ED Feel like aleve  triggered up something, did not help with pain  Last night put hot water into ear through a straw,  felt better Interrupted sleep for 3 nights Pain in chest by collarbone right now  Refusing 911, saying he'll drive himself Stating he is going to shopping center rather than call from home then will call 911 Advised pt call 911 right away Pt choosing to drive himself  Per aunt, needs a complete work up for all symptoms, needs blood work, don't know what he's had when he's had it    Advised pt be examined in hospital right away for symptoms,  advised call 911, pt refusing at this time, heading to hospital to ED. Advised pt call 911 for any worsening, advised pt mention all symptoms to ED. Advised follow up with PCP after hospital.  Protocols used: [facility]

## 2023-09-30 NOTE — Telephone Encounter (Signed)
 I called pt - no answer. No vm, unable to leave a message.

## 2023-09-30 NOTE — ED Provider Notes (Signed)
 [place] EMERGENCY DEPARTMENT AT [facility] Provider Note  CSN: [id number] Arrival date & time: 09/30/23 1919  Chief Complaint(s) multiple complaints  HPI Rodney Chase is a 48 y.o. male with a history of HIV, on Biktarvy , compliant with medications, who presents to the emergency room today due to multiple complaints.  He is noticed some ear fullness, has noticed some swollen lymph nodes, has had some aches and pains.   Past Medical History Past Medical History:  Diagnosis Date   Floaters with photopsia    HIV (human immunodeficiency virus infection) (HCC)    Tobacco use    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/13/2022   Therapeutic drug monitoring 09/17/2021   GAD (generalized anxiety disorder) 11/22/2020   Healthcare maintenance 06/21/2019   Human immunodeficiency virus (HIV) disease (HCC) 03/27/2006   Home Medication(s) Prior to Admission medications   Medication Sig Start Date End Date Taking? Authorizing Provider  bictegravir-emtricitabine -tenofovir  AF (BIKTARVY ) 50-200-25 MG TABS tablet Take 1 tablet by mouth daily. 06/29/23   Calone, Gregory D, FNP  Ensure (ENSURE) Take 237 mLs by mouth 2 (two) times daily between meals. 04/30/23   Calone, Gregory D, FNP  methocarbamol  (ROBAXIN ) 500 MG tablet Take 1 tablet (500 mg total) by mouth at bedtime as needed for muscle spasms. 03/02/23   Blaise Aleene KIDD, MD  ondansetron  (ZOFRAN -ODT) 4 MG disintegrating tablet Take 1 tablet (4 mg total) by mouth every 8 (eight) hours as needed for nausea or vomiting. 04/23/23   Johnie Flaming A, NP  traMADol  (ULTRAM ) 50 MG tablet Take 1 tablet (50 mg total) by mouth every 12 (twelve) hours as needed. 03/02/23   Lamptey, Aleene KIDD, MD                                                                                                                                    Past Surgical History Past  Surgical History:  Procedure Laterality Date   NO PAST SURGERIES     Family History Family History  Problem Relation Age of Onset   Breast cancer Mother        breast   Prostate cancer Father    Prostate cancer Brother    Cancer Maternal Grandmother    Diabetes Paternal Grandmother    Diabetes Daughter    Lung cancer Maternal Aunt     Social History Social History   Tobacco Use   Smoking status: Every Day    Current packs/day: 0.30    Types: Cigarettes   Smokeless tobacco: Never  Vaping Use   Vaping status: Never Used  Substance Use Topics   Alcohol use: Yes    Comment: occasionally   Drug use: Yes    Types: Marijuana    Comment: daily   Allergies Penicillins  Review of Systems Review of Systems  Physical  Exam Vital Signs  I have reviewed the triage vital signs BP (!) 135/99   Pulse 95   Temp 99 F (37.2 C) (Oral)   Resp 20   SpO2 100%   Physical Exam Vitals and nursing note reviewed.  Constitutional:      Appearance: He is not toxic-appearing.  HENT:     Right Ear: Tympanic membrane normal.     Left Ear: Tympanic membrane normal.     Mouth/Throat:     Mouth: Mucous membranes are moist.     Comments: No floor of mouth infection Eyes:     Pupils: Pupils are equal, round, and reactive to light.  Cardiovascular:     Rate and Rhythm: Normal rate.  Musculoskeletal:     Cervical back: Normal range of motion.  Lymphadenopathy:     Cervical: No cervical adenopathy.  Skin:    General: Skin is warm.  Neurological:     General: No focal deficit present.     Mental Status: He is alert.     ED Results and Treatments Labs (all labs ordered are listed, but only abnormal results are displayed) Labs Reviewed  COMPREHENSIVE METABOLIC PANEL WITH GFR - Abnormal; Notable for the following components:      Result Value   Glucose, Bld 100 (*)    Calcium 8.8 (*)    Albumin 3.0 (*)    ALT 51 (*)    All other components within normal limits  RESP PANEL BY  RT-PCR (RSV, FLU A&B, COVID)  RVPGX2  GROUP A STREP BY PCR  CBC WITH DIFFERENTIAL/PLATELET  LIPASE, BLOOD  TROPONIN I (HIGH SENSITIVITY)                                                                                                                          Radiology DG Chest 2 View Result Date: 09/30/2023 CLINICAL DATA:  Shortness of breath EXAM: CHEST - 2 VIEW COMPARISON:  04/30/2022 FINDINGS: Cardiac shadow is within normal limits. The lungs are well aerated bilaterally. No focal infiltrate or effusion is seen. No bony abnormality is noted. IMPRESSION: No acute abnormality noted. Electronically Signed   By: Oneil Devonshire M.D.   On: 09/30/2023 20:21    Pertinent labs & imaging results that were available during my care of the patient were reviewed by me and considered in my medical decision making (see MDM for details).  Medications Ordered in ED Medications  ibuprofen  (ADVIL ) tablet 400 mg (400 mg Oral Given 09/30/23 2223)  acetaminophen  (TYLENOL ) tablet 1,000 mg (1,000 mg Oral Given 09/30/23 2224)  Procedures Procedures  (including critical care time)  Medical Decision Making / ED Course   This patient presents to the ED for concern of multiple complaints, this involves an extensive number of treatment options, and is a complaint that carries with it a high risk of complications and morbidity.  The differential diagnosis includes viral syndrome, ear fullness, less likely HIV related illness.  MDM: Patient overall well-appearing on exam.  He has normal vital signs.  Normal physical exam.  Patient with blood work done at triage, no leukocytosis, normal renal function.  Had a chest x-ray done that was normal.  He is appropriate for discharge.   Additional history obtained: -Additional history obtained from EMS -External records from outside source  obtained and reviewed including: Chart review including previous notes, labs, imaging, consultation notes   Lab Tests: -I ordered, reviewed, and interpreted labs.   The pertinent results include:   Labs Reviewed  COMPREHENSIVE METABOLIC PANEL WITH GFR - Abnormal; Notable for the following components:      Result Value   Glucose, Bld 100 (*)    Calcium 8.8 (*)    Albumin 3.0 (*)    ALT 51 (*)    All other components within normal limits  RESP PANEL BY RT-PCR (RSV, FLU A&B, COVID)  RVPGX2  GROUP A STREP BY PCR  CBC WITH DIFFERENTIAL/PLATELET  LIPASE, BLOOD  TROPONIN I (HIGH SENSITIVITY)     Imaging Studies ordered: I ordered imaging studies including chest x-ray I independently visualized and interpreted imaging. I agree with the radiologist interpretation   Medicines ordered and prescription drug management: Meds ordered this encounter  Medications   ibuprofen  (ADVIL ) tablet 400 mg   acetaminophen  (TYLENOL ) tablet 1,000 mg    -I have reviewed the patients home medicines and have made adjustments as needed   Cardiac Monitoring: The patient was maintained on a cardiac monitor.  I personally viewed and interpreted the cardiac monitored which showed an underlying rhythm of: Normal sinus rhythm  Social Determinants of Health:  Factors impacting patients care include: Multiple medical comorbidities including HIV   Reevaluation: After the interventions noted above, I reevaluated the patient and found that they have :improved  Co morbidities that complicate the patient evaluation  Past Medical History:  Diagnosis Date   Floaters with photopsia    HIV (human immunodeficiency virus infection) (HCC)    Tobacco use       Dispostion: I considered admission for this patient, however he is appropriate for discharge.     Final Clinical Impression(s) / ED Diagnoses Final diagnoses:  Viral syndrome     @PCDICTATION @    Mannie Pac T, DO 09/30/23 2234

## 2023-09-30 NOTE — ED Provider Triage Note (Signed)
 Emergency Medicine Provider Triage Evaluation Note  Rodney Chase , a 48 y.o. male  was evaluated in triage.  Pt complains of denies body aches, cough, congestion, and sore throat for the past couple of days.  Also reports he has been having some shortness of breath and chest discomfort, particularly with movement of his upper extremities.  Chest pain is nonexertional, nonpleuritic.  The shortness of breath and chest discomfort has been ongoing for multiple weeks.  Denies any acute worsening of symptoms.  Review of Systems  Positive: As above Negative: As above  Physical Exam  BP (!) 135/99   Pulse 95   Temp 99 F (37.2 C) (Oral)   Resp 20   SpO2 100%  Gen:   Awake, no distress   Resp:  Normal effort  MSK:   Moves extremities without difficulty    Medical Decision Making  Medically screening exam initiated at 7:51 PM.  Appropriate orders placed.  Jermone Geister was informed that the remainder of the evaluation will be completed by another provider, this initial triage assessment does not replace that evaluation, and the importance of remaining in the ED until their evaluation is complete.     Veta Palma, PA-C 09/30/23 1951

## 2023-09-30 NOTE — ED Notes (Signed)
 Warm blanket given to pt

## 2023-09-30 NOTE — ED Notes (Signed)
 PT called back to triage for EKG x1, no response

## 2023-09-30 NOTE — Telephone Encounter (Signed)
 I called and left VM to call back for update regarding condition.  [name] MD 09/30/2023, 4:44 PM

## 2023-09-30 NOTE — Telephone Encounter (Signed)
 Copied from CRM [id number]. Topic: Clinical - Lab/Test Results >> Sep 30, 2023  1:02 PM Carrielelia G wrote: Patient Rodney Chase would like a call back again regarding PSA results and he would like for you to speak with his Aunt too.

## 2023-09-30 NOTE — ED Triage Notes (Addendum)
 Pt bib [facility] from home with multiple complaints including sore throat, nonproductive cough, congestion, ear pain, BUE pain, leg pain, chest discomfort, and n/v. All of this has been on going for a couple weeks.

## 2023-10-01 ENCOUNTER — Encounter (HOSPITAL_COMMUNITY): Payer: Self-pay

## 2023-10-01 ENCOUNTER — Ambulatory Visit (HOSPITAL_COMMUNITY): Admission: EM | Admit: 2023-10-01 | Discharge: 2023-10-01 | Disposition: A | Discharge status: Home / Self Care

## 2023-10-01 DIAGNOSIS — N489 Disorder of penis, unspecified: Secondary | ICD-10-CM | POA: Diagnosis not present

## 2023-10-01 DIAGNOSIS — L0291 Cutaneous abscess, unspecified: Secondary | ICD-10-CM | POA: Insufficient documentation

## 2023-10-01 MED ORDER — SULFAMETHOXAZOLE-TRIMETHOPRIM 800-160 MG PO TABS: 1.0000 | ORAL_TABLET | Freq: Two times a day (BID) | ORAL | 0 refills | Status: DC

## 2023-10-01 NOTE — ED Triage Notes (Signed)
 Patient here today with c/o abscess in groin area X 1 week. Patient states that the pain increased last night.   Patient also c/o ST and bilat ear pain for a while now. Patient went to ED yesterday.

## 2023-10-01 NOTE — ED Provider Notes (Signed)
 [facility]    CSN: [id number] Arrival date & time: 10/01/23  9053      History   Chief Complaint Chief Complaint  Patient presents with   Abscess   Sore Throat    HPI Rodney Chase is a 47 y.o. male.   Patient is a 48 year old male with a past medical history of HIV who presents to the urgent care today with concerns of sore throat, cough, and possible boil near his groin.  He reports being seen in the emergency department yesterday for the upper respiratory symptoms and was swabbed and was negative for everything.  He was told it was a virus and was not prescribed any medications.  He did not mention that he had concern of a possible boil of his groin at that time.  He reports that this has been going on for about a week and he thought it was going away but it seems to be coming back.  He is sexually active and received oral sex about 2 weeks ago.  He denies any penile discharge, burning with urination, blood in the urine, testicular pain, vomiting, or other concerns at this time.  He tried putting some cocoa butter on that area with no improvement in his symptoms.    Past Medical History:  Diagnosis Date   Floaters with photopsia    HIV (human immunodeficiency virus infection) (HCC)    Tobacco use     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/13/2022   Therapeutic drug monitoring 09/17/2021   GAD (generalized anxiety disorder) 11/22/2020   Healthcare maintenance 06/21/2019   Human immunodeficiency virus (HIV) disease (HCC) 03/27/2006    Past Surgical History:  Procedure Laterality Date   NO PAST SURGERIES         Home Medications    Prior to Admission medications   Medication Sig Start Date End Date Taking? Authorizing Provider  sulfamethoxazole -trimethoprim  (BACTRIM  DS) 800-160 MG tablet Take 1 tablet by mouth 2 (two) times daily for 7 days. 10/01/23 10/08/23  Yes Melonie Locus, PA-C  bictegravir-emtricitabine -tenofovir  AF (BIKTARVY ) 50-200-25 MG TABS tablet Take 1 tablet by mouth daily. 06/29/23   Calone, Gregory D, FNP  Ensure (ENSURE) Take 237 mLs by mouth 2 (two) times daily between meals. 04/30/23   Philemon Cordella BIRCH, FNP    Family History Family History  Problem Relation Age of Onset   Breast cancer Mother        breast   Prostate cancer Father    Prostate cancer Brother    Cancer Maternal Grandmother    Diabetes Paternal Grandmother    Diabetes Daughter    Lung cancer Maternal Aunt     Social History Social History   Tobacco Use   Smoking status: Every Day    Current packs/day: 0.30    Types: Cigarettes   Smokeless tobacco: Never  Vaping Use   Vaping status: Never Used  Substance Use Topics   Alcohol use: Not Currently    Comment: occasionally   Drug use: Yes    Types: Marijuana    Comment: daily     Allergies   Penicillins   Review of Systems Review of Systems See HPI for relevant ROS.  Physical Exam Triage Vital Signs ED Triage Vitals [10/01/23 1039]  Encounter Vitals Group     BP (!) 128/91     Girls Systolic BP Percentile  Girls Diastolic BP Percentile      Boys Systolic BP Percentile      Boys Diastolic BP Percentile      Pulse Rate 88     Resp 16     Temp 98.3 F (36.8 C)     Temp Source Oral     SpO2 98 %     Weight      Height      Head Circumference      Peak Flow      Pain Score 6     Pain Loc      Pain Education      Exclude from Growth Chart    No data found.  Updated Vital Signs BP (!) 128/91 (BP Location: Left Arm)   Pulse 88   Temp 98.3 F (36.8 C) (Oral)   Resp 16   SpO2 98%   Visual Acuity Right Eye Distance:   Left Eye Distance:   Bilateral Distance:    Right Eye Near:   Left Eye Near:    Bilateral Near:     Physical Exam General: Alert and oriented, well-developed/well-nourished, calm, cooperative, no acute distress HEENT: Normocephalic atraumatic, moist  mucous membranes, no scleral icterus, trachea midline, pharynx without erythema tonsillar swelling or exudates, uvula midline Lungs: Speaking full sentences, non-labored respirations, no distress, clear to auscultation bilaterally Heart: Regular rate and rhythm Abdomen:  Soft, nondistended Musculoskeletal: Moves all extremities well GU: Multiple oval/circular wounds that are mostly flat of the scrotum and penis, small raised erythematous area of the right groin Neurologic: Awake, A&O x4, gait normal Integumentary: Warm, dry, normal for ethnicity Psychiatric: Appropriate mood & affect  UC Treatments / Results  Labs (all labs ordered are listed, but only abnormal results are displayed) Labs Reviewed  RPR  CYTOLOGY, (ORAL, ANAL, URETHRAL) ANCILLARY ONLY    EKG   Radiology DG Chest 2 View Result Date: 09/30/2023 CLINICAL DATA:  Shortness of breath EXAM: CHEST - 2 VIEW COMPARISON:  04/30/2022 FINDINGS: Cardiac shadow is within normal limits. The lungs are well aerated bilaterally. No focal infiltrate or effusion is seen. No bony abnormality is noted. IMPRESSION: No acute abnormality noted. Electronically Signed   By: Oneil Devonshire M.D.   On: 09/30/2023 20:21    Procedures Procedures (including critical care time)  Medications Ordered in UC Medications - No data to display  Initial Impression / Assessment and Plan / UC Course  I have reviewed the triage vital signs and the nursing notes.  Pertinent labs & imaging results that were available during my care of the patient were reviewed by me and considered in my medical decision making (see chart for details).    Bactrim  for coverage of possible abscess, STI screening, follow-up with PCP  Presents with concern of a boil near the groin, sore throat, and cough.  Differential Diagnosis: Upper respiratory infection, respiratory complication of HIV, HSV, HPV, syphilis, molluscum contagiosum, chancroid, folliculitis, Kaposi's sarcoma,  Fournier's gangrene, penile squamous cell carcinoma, abscess, including other diagnoses.  Rationale: Patient presents with upper respiratory symptoms and concern for a boil on the groin.  Patient was evaluated in the emergency department yesterday and negative for RSV, flu, COVID, and strep.  X-ray was obtained yesterday and radiologist report no acute abnormality. Patient was diagnosed with a viral syndrome.  Upon visual exam of the penis and scrotum, patient does appear to have some lesions of that area.  We performed testing for gonorrhea, chlamydia, and syphilis here today.  We we will  contact patient and prescribe medications for treatment at that time if indicated.  Patient did have an area of small erythema and edema which may represent an abscess forming.  Prescribed patient Bactrim  for coverage of possible abscess.  Recommended patient follow-up with his primary care provider in the next several days.  Discussed return precautions to the urgent care emergency department including worsening symptoms, pain out of proportion, fever, penile discharge, or if he has any other concerns.  Disposition: Stable to discharge home.  All questions answered to the best of this examiner's ability. Reviewed possible severe sequelae and other reasons to return to urgent care or ED for further evaluation and/or treatment. Advised to f/u PCP or the health department w/in 48 to 72 hours for further eval and/or reassessment as needed. Patient voices understanding of the above and agrees to plan.  An appropriate evaluation has been performed, and in my medical judgment there is currently no evidence of an immediate life-threatening or surgical condition. Discharge is therefore indicated at this time.  This document was created using the aid of voice recognition Scientist, clinical (histocompatibility and immunogenetics).  Final Clinical Impressions(s) / UC Diagnoses   Final diagnoses:  Abscess  Penile lesion     Discharge Instructions       We have sent in an antibiotic to cover for possible boil/abscess.  Additionally, we have performed some screening for STIs today.  We will contact you if you test positive and prescribe medications at that time if indicated.  We recommend following up with your primary care provider for further evaluation and management.  Additionally, we did not perform comprehensive STI screening today and you can have this performed at the health department for further evaluation.  Please return or go to the emergency department if you develop any severe testicular pain, abdominal pain, penile discharge, worsening of symptoms, or if you have any other concerns.    ED Prescriptions     Medication Sig Dispense Auth. Provider   sulfamethoxazole -trimethoprim  (BACTRIM  DS) 800-160 MG tablet Take 1 tablet by mouth 2 (two) times daily for 7 days. 14 tablet Melonie Locus, PA-C      PDMP not reviewed this encounter.   Melonie Locus, PA-C 10/01/23 1219

## 2023-10-01 NOTE — Discharge Instructions (Signed)
 We have sent in an antibiotic to cover for possible boil/abscess.  Additionally, we have performed some screening for STIs today.  We will contact you if you test positive and prescribe medications at that time if indicated.  We recommend following up with your primary care provider for further evaluation and management.  Additionally, we did not perform comprehensive STI screening today and you can have this performed at the health department for further evaluation.  Please return or go to the emergency department if you develop any severe testicular pain, abdominal pain, penile discharge, worsening of symptoms, or if you have any other concerns.

## 2023-10-02 ENCOUNTER — Ambulatory Visit (HOSPITAL_COMMUNITY): Payer: Self-pay

## 2023-10-02 LAB — CYTOLOGY, (ORAL, ANAL, URETHRAL) ANCILLARY ONLY
Chlamydia: NEGATIVE
Comment: NEGATIVE
Comment: NEGATIVE
Comment: NORMAL
Neisseria Gonorrhea: NEGATIVE
Trichomonas: NEGATIVE

## 2023-10-02 LAB — RPR
RPR Ser Ql: REACTIVE — AB
RPR Titer: 1:128 {titer}

## 2023-10-05 ENCOUNTER — Other Ambulatory Visit: Payer: Self-pay

## 2023-10-05 ENCOUNTER — Ambulatory Visit (INDEPENDENT_AMBULATORY_CARE_PROVIDER_SITE_OTHER): Admitting: Student

## 2023-10-05 ENCOUNTER — Encounter: Payer: Self-pay | Admitting: Student

## 2023-10-05 ENCOUNTER — Ambulatory Visit: Payer: Self-pay

## 2023-10-05 VITALS — BP 100/68 | HR 88 | Temp 97.8°F | Ht 64.0 in | Wt 121.0 lb

## 2023-10-05 DIAGNOSIS — B20 Human immunodeficiency virus [HIV] disease: Secondary | ICD-10-CM

## 2023-10-05 DIAGNOSIS — A539 Syphilis, unspecified: Secondary | ICD-10-CM | POA: Insufficient documentation

## 2023-10-05 DIAGNOSIS — N4821 Abscess of corpus cavernosum and penis: Secondary | ICD-10-CM | POA: Diagnosis not present

## 2023-10-05 DIAGNOSIS — A5149 Other secondary syphilitic conditions: Secondary | ICD-10-CM

## 2023-10-05 LAB — T.PALLIDUM AB, TOTAL: T Pallidum Abs: REACTIVE — AB

## 2023-10-05 MED ORDER — DOXYCYCLINE HYCLATE 100 MG PO CAPS: 100.0000 mg | ORAL_CAPSULE | Freq: Two times a day (BID) | ORAL | 0 refills | Status: AC

## 2023-10-05 NOTE — Assessment & Plan Note (Addendum)
 HIV-positive patient with high-risk sexual activity initially presented to the ED with generalized fatigue and a new penile lesions. RPR was positive at a titer of 1:258. Two days later, the patient developed a maculopapular rash on both arms, consistent with secondary syphilis. On exam, there is an ulcerative lesions on the distal penile shaft and scrotum, with firm borders. Other STI testing was otherwise negative.  Plan: - Empirically treating for syphilis while awaiting confirmatory Treponema pallidum antibody test. - Due to a shortage of penicillin G within the [facility] system, the patient will be treated with doxycycline  100 mg twice daily for 14 days. - Will defer notification to the health department until confirmatory testing is available. - Monitor for Jarisch Herxheimer reaction after initiation of Abx for syphilis.

## 2023-10-05 NOTE — Telephone Encounter (Signed)
 Patient is scheduled to come in this afternoon to see Dr.Koomson.

## 2023-10-05 NOTE — Telephone Encounter (Signed)
 FYI Only or Action Required?: FYI only for provider.  Patient was last seen in primary care on 09/22/2023 by Rodney Lapine, MD.  Called Nurse Triage reporting Rash.  Symptoms began several days ago.  Interventions attempted: Nothing.  Symptoms are: gradually worsening.  Triage Disposition: See Physician Within 24 Hours  Patient/caregiver understands and will follow disposition?: Yes     Copied from CRM #[id number]. Topic: Clinical - Red Word Triage >> Oct 05, 2023 10:13 AM [name] wrote: Red Word that prompted transfer to Nurse Triage: Patient called in with complaints of itching red bumps that are spreading throughout the his body. Patient is itching on elbows, legs, arms, head, back of the neck, and back. They are small circles that start itching then gets a scab or like a rash with a severe itch. It progressively gets itches. In addition, patient states it looks like a ring worm. Reason for Disposition  SEVERE itching (i.e., interferes with sleep, normal activities or school)  Answer Assessment - Initial Assessment Questions Patient was seen in UC recently for boil and placed on Bactrim . Unsure if he is having a reaction.Patient states it looks like ringworm in some areas. Patient states he also wants a visual exam of his chest.     1. APPEARANCE of RASH: What does the rash look like? (e.g., blisters, dry flaky skin, red spots, redness, sores)     Patient states his skin looks dimpled, red, and bumpy  2. SIZE: How big are the spots? (e.g., tip of pen, eraser, coin; inches, centimeters)     Cannot determine  3. LOCATION: Where is the rash located?     Patient states it is widespread  4. COLOR: What color is the rash? (Note: It is difficult to assess rash color in people with darker-colored skin. When this situation occurs, simply ask the caller to describe what they see.)     Red spots that are widespread all over body and look like scabs  5. ONSET: When did the rash  begin?     After he went to UC  6. FEVER: Do you have a fever? If Yes, ask: What is your temperature, how was it measured, and when did it start?     No  7. ITCHING: Does the rash itch? If Yes, ask: How bad is the itch? (Scale 1-10; or mild, moderate, severe)     Very itchy  8. CAUSE: What do you think is causing the rash?     Patient is uncertain - but roommate has scabs so he is unsure if it is contagious  9. MEDICINE FACTORS: Have you started any new medicines within the last 2 weeks? (e.g., antibiotics)      Patient was put on Bactrim  a few days ago for a boil  10. OTHER SYMPTOMS: Do you have any other symptoms? (e.g., dizziness, headache, sore throat, joint pain)       Chest pain around neck but was assessed by ER  Protocols used: Rash or Redness - [facility]

## 2023-10-05 NOTE — Progress Notes (Signed)
   CC: ED follow-up  HPI:  Rodney Chase is a 48 y.o. male with HIV on Biktarvy  here for hospital follow-up.  The patient was seen in the ED on 7/31 for upper respiratory symptoms and a penile abscess for which he was prescribed Bactrim  800/160. Urethral swab was negative for gonorrhea, chlamydia, and trichomonas.  RPR was positive   He reports a new male sexual partner, 2-3 weeks ago.  Since leaving the ED, the boil is decreasing in size, with no erythema or drainage.   He reports new skin changes on the abdomen and bilateral palms.   Past Medical History:  Diagnosis Date   Floaters with photopsia    HIV (human immunodeficiency virus infection) (HCC)    Tobacco use     Current Outpatient Medications on File Prior to Visit  Medication Sig Dispense Refill   bictegravir-emtricitabine -tenofovir  AF (BIKTARVY ) 50-200-25 MG TABS tablet Take 1 tablet by mouth daily. 30 tablet 3   Ensure (ENSURE) Take 237 mLs by mouth 2 (two) times daily between meals. 237 mL 11   sulfamethoxazole -trimethoprim  (BACTRIM  DS) 800-160 MG tablet Take 1 tablet by mouth 2 (two) times daily for 7 days. 14 tablet 0   No current facility-administered medications on file prior to visit.    Review of Systems: ROS negative except for what is noted on the assessment and plan.  Vitals:   10/05/23 1520  BP: 100/68  Pulse: 88  Temp: 97.8 F (36.6 C)  TempSrc: Oral  SpO2: 100%  Weight: 121 lb (54.9 kg)  Height: 5' 4 (1.626 m)   Physical Exam: Constitutional: NAD Cardiovascular: RRR, no murmurs. Pulmonary/Chest: Clear bilateral lungs GU: Multiple healing ulcers on the penile shaft and scrotum. Skin: Maculopapular rash in bilateral palms. Assessment & Plan:   Patient discussed with Dr. 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 RPR was positive at a titer of 1:258. Two  days later, the patient developed a maculopapular rash on both arms, consistent with secondary syphilis. On exam, there is an ulcerative lesions on the distal penile shaft and scrotum, with firm borders. Other STI testing was otherwise negative.  Plan: - Empirically treating for syphilis while awaiting confirmatory Treponema pallidum antibody test. - Due to a shortage of penicillin G within the [facility] system, the patient will be treated with doxycycline  100 mg twice daily for 14 days. - Will defer notification to the health department until confirmatory testing is available. - Monitor for Jarisch Herxheimer reaction after initiation of Abx for syphilis. Penile abscess Multiple healing ulcers on the penile shaft and scrotum.  Prescribed Bactrim  at the ED, the current rash is not consistent with a Bactrim  reaction, which typically presents as a nonscaly, sunburn-like erythema. - TMP-SMX( 7/31-8/7)  No orders of the defined types were placed in this encounter.   Missy Sandhoff, MD [facility] Internal Medicine, PGY-2  Date 10/05/2023 Time 9:44 PM

## 2023-10-05 NOTE — Patient Instructions (Addendum)
 It was a pleasure taking care of you today!    The rash on your palms and the boil in your groin are concerning for possible syphilis.  We will proceed with further testing to confirm the diagnosis. If confirmed, treatment will be initiated with doxycycline  100 mg twice daily for 14 days.  Please continue taking TMP/SMX as prescribed for the boil.  I have ordered the following labs for you:  Lab Orders  No laboratory test(s) ordered today      Follow up: 2 months   Should you have any questions or concerns please call the internal medicine clinic at [phone].     Missy Sandhoff, MD  [facility]

## 2023-10-05 NOTE — Telephone Encounter (Signed)
 Copied from CRM [id number]. Topic: Clinical - Prescription Issue >> Oct 05, 2023 10:06 AM [name] wrote: Reason for CRM: Patient keeps getting messages stating the medication is ready to pick up, however, when he gets there to pick up the medication they state the medicine is not ready and that they have to check with the dispensary. Patient is requesting an emergency refill of bictegravir-emtricitabine -tenofovir  AF (BIKTARVY ) 50-200-25 MG TABS tablet, sulfamethoxazole -trimethoprim  (BACTRIM  DS) 800-160 MG tablet, and Ensure (ENSURE). Please contact patient at [phone].

## 2023-10-05 NOTE — Assessment & Plan Note (Signed)
 Multiple healing ulcers on the penile shaft and scrotum.  Prescribed Bactrim  at the ED, the current rash is not consistent with a Bactrim  reaction, which typically presents as a nonscaly, sunburn-like erythema. - TMP-SMX( 7/31-8/7)

## 2023-10-06 NOTE — Telephone Encounter (Signed)
 Prescription for doxycycline  has already been sent to patient's pharmacy by Dr.Koomson.

## 2023-10-06 NOTE — Progress Notes (Signed)
Internal Medicine Clinic Attending  I was physically present during the key portions of the resident provided service and participated in the medical decision making of patient's management care. I reviewed pertinent patient test results.  The assessment, diagnosis, and plan were formulated together and I agree with the documentation in the resident's note.  Mercie Eon, MD

## 2023-10-07 MED ORDER — ENSURE PO LIQD: 237.0000 mL | Freq: Two times a day (BID) | ORAL | 11 refills | Status: DC

## 2023-10-07 MED ORDER — BIKTARVY 50-200-25 MG PO TABS: 1.0000 | ORAL_TABLET | Freq: Every day | ORAL | 3 refills | Status: AC

## 2023-10-07 MED ORDER — SULFAMETHOXAZOLE-TRIMETHOPRIM 800-160 MG PO TABS: 1.0000 | ORAL_TABLET | Freq: Two times a day (BID) | ORAL | 0 refills | Status: AC

## 2023-10-07 NOTE — Telephone Encounter (Signed)
 Extended course of Bactrim  as he's still having the boils on his penis, but they're improving.  Refilled Biktarvy .  Meds ordered this encounter  Medications   bictegravir-emtricitabine -tenofovir  AF (BIKTARVY ) 50-200-25 MG TABS tablet    Sig: Take 1 tablet by mouth daily.    Dispense:  30 tablet    Refill:  3    Prescription Type::   Renewal   sulfamethoxazole -trimethoprim  (BACTRIM  DS) 800-160 MG tablet    Sig: Take 1 tablet by mouth 2 (two) times daily for 7 days.    Dispense:  14 tablet    Refill:  0   Ensure (ENSURE)    Sig: Take 237 mLs by mouth 2 (two) times daily between meals.    Dispense:  237 mL    Refill:  11   [name] MD 10/07/2023, 5:29 PM

## 2023-10-07 NOTE — Progress Notes (Signed)
 Internal Medicine Clinic Attending  Case discussed with the resident at the time of the visit.  We reviewed the resident's history and exam and pertinent patient test results.  I agree with the assessment, diagnosis, and plan of care documented in the resident's note.    I agree with this assessment and plan. I have a high suspicion for secondary syphilis given patient's recent sexual encounter, his high RPR titer, and his rash over his palms (see pictures in chart). We will go ahead and treat with Doxycycline  while awaiting confirmatory testing.

## 2023-10-09 ENCOUNTER — Telehealth: Payer: Self-pay | Admitting: *Deleted

## 2023-10-09 NOTE — Telephone Encounter (Signed)
 CRM # [id number] Owner: Gretta Lauraine HERO, RN Status: Resolved Open  Priority: Routine Created on: 10/09/2023 11:39 AM By: Zeidan, Vivian   Primary Information  Source  Rodney Chase (Patient)   Subject  Rodney Chase (Patient)   Topic  Clinical - Prescription Issue    Communication  Reason for CRM: Patient calling to check status of prescription for Ensure (ENSURE) which was sent on 10/07/23 but unsure where it was sent to.        Callback: [phone]

## 2023-10-14 ENCOUNTER — Other Ambulatory Visit: Payer: Self-pay

## 2023-10-14 ENCOUNTER — Other Ambulatory Visit (HOSPITAL_COMMUNITY)
Admission: RE | Admit: 2023-10-14 | Discharge: 2023-10-14 | Disposition: A | Discharge status: Home / Self Care | Source: Ambulatory Visit | Attending: Family | Admitting: Family

## 2023-10-14 ENCOUNTER — Ambulatory Visit

## 2023-10-14 ENCOUNTER — Other Ambulatory Visit

## 2023-10-14 VITALS — BP 118/88 | HR 106 | Temp 97.7°F | Ht 64.0 in | Wt 124.0 lb

## 2023-10-14 DIAGNOSIS — Z113 Encounter for screening for infections with a predominantly sexual mode of transmission: Secondary | ICD-10-CM | POA: Diagnosis present

## 2023-10-14 DIAGNOSIS — Z79899 Other long term (current) drug therapy: Secondary | ICD-10-CM

## 2023-10-14 DIAGNOSIS — B20 Human immunodeficiency virus [HIV] disease: Secondary | ICD-10-CM | POA: Diagnosis not present

## 2023-10-14 DIAGNOSIS — F411 Generalized anxiety disorder: Secondary | ICD-10-CM | POA: Diagnosis present

## 2023-10-14 DIAGNOSIS — R002 Palpitations: Secondary | ICD-10-CM

## 2023-10-14 DIAGNOSIS — A5149 Other secondary syphilitic conditions: Secondary | ICD-10-CM

## 2023-10-14 DIAGNOSIS — M542 Cervicalgia: Secondary | ICD-10-CM | POA: Diagnosis not present

## 2023-10-14 NOTE — Assessment & Plan Note (Signed)
 Patient has been on Biktarvy  for HIV and takes it every day.  He follows Mr. Cordella July, FNP every 6 months for HIV. --Continue taking Biktarvy  daily

## 2023-10-14 NOTE — Patient Instructions (Signed)
 Please follow directions as discussed in today's plan: --Take Tylenol  along with warm compress for your right sided neck pain. --Will place a referral with our therapist Renda Pontes.  She will reach out to you soon --Please hydrate yourself and eat well before any physical activity to avoid passing out --Continue taking doxycycline  as prescribed --Please give us  a call or schedule an appointment with us  if you have any questions or concerns  Thank you for seeing us  today!  Sincerely, Dr. Edgardo

## 2023-10-14 NOTE — Telephone Encounter (Signed)
 Rx was written 10/07/23.

## 2023-10-14 NOTE — Progress Notes (Signed)
 CC: Right sided neck pain  HPI:  [name] is a 48 y.o. male living with a history stated below and presents today for right-sided neck pain and several complaints.   Patient said that he has a sharp neck pain that started about 1 to 3 days ago but he noticed it yesterday morning.  He denies the pain radiating down his arm or up into his head.  He denies any numbness or tingling in the area or down his fingers on the same side.  He said the pain is 9 out of 10.    Patient was recently seen on 10/05/2023 regarding secondary syphilis.  He is taking his doxycycline  2 times a day.  Patient was complaining about hands feeling itchy.  He is taking calamine lotion that has provided him some relief but just wanted to let us  know.  Patient complained about feeling dizzy and passing out multiple times in the past 15 years.  He said that happens once in a few months every year.  Patient mentioned several instances where he was either feeling anxious or was physically exerting himself before he passed out.  He said he was shoveling snow one time and had the episode.  He also had the same episode when he was sitting out in hot weather.  Patient said before he blacks out he feels really hot and his heart starts beating really fast.  He does mention not being hydrated enough while these episodes occur.   Patient talked about feeling very stressed out recently.  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  He said he does not sleep well and stays up until dawn and that he needs a break.  Patient mentioned that he has been feeling very angry for the past 3 years especially since his dad passed.  Please see problem based assessment and plan for additional details.  Past Medical History:  Diagnosis Date   Floaters with photopsia    HIV (human immunodeficiency virus infection) (HCC)    Tobacco use     Current Outpatient Medications on File Prior to Visit   Medication Sig Dispense Refill   bictegravir-emtricitabine -tenofovir  AF (BIKTARVY ) 50-200-25 MG TABS tablet Take 1 tablet by mouth daily. 30 tablet 3   doxycycline  (VIBRAMYCIN ) 100 MG capsule Take 1 capsule (100 mg total) by mouth 2 (two) times daily for 14 days. 28 capsule 0   Ensure (ENSURE) Take 237 mLs by mouth 2 (two) times daily between meals. 237 mL 11   sulfamethoxazole -trimethoprim  (BACTRIM  DS) 800-160 MG tablet Take 1 tablet by mouth 2 (two) times daily for 7 days. 14 tablet 0   No current facility-administered medications on file prior to visit.    Family History  Problem Relation Age of Onset   Breast cancer Mother        breast   Prostate cancer Father    Prostate cancer Brother    Cancer Maternal Grandmother    Diabetes Paternal Grandmother    Diabetes Daughter    Lung cancer Maternal Aunt     Social History   Socioeconomic History   Marital status: Single    Spouse name: Not on file   Number of children: Not on file   Years of education: Not on file   Highest education level: Not on file  Occupational History   Not on file  Tobacco Use   Smoking status: Every Day    Current  packs/day: 0.30    Types: Cigarettes   Smokeless tobacco: Never  Vaping Use   Vaping status: Never Used  Substance and Sexual Activity   Alcohol use: Not Currently    Comment: occasionally   Drug use: Yes    Types: Marijuana    Comment: daily   Sexual activity: Not Currently    Partners: Male  Other Topics Concern   Not on file  Social History Narrative   Lives with roommate   Care giver   Highest level of education: 12th   Social Drivers of Health   Financial Resource Strain: Not on file  Food Insecurity: Low Risk  (04/29/2023)   Received from [facility]   Hunger Vital Sign    Within the past 12 months, you worried that your food would run out before you got money to buy more: Never true    Within the past 12 months, the food you bought just didn't last and you didn't  have money to get more. : Never true  Transportation Needs: No Transportation Needs (04/29/2023)   Received from [facility]    In the past 12 months, has lack of reliable transportation kept you from medical appointments, meetings, work or from getting things needed for daily living? : No  Physical Activity: Not on file  Stress: Not on file  Social Connections: Not on file  Intimate Partner Violence: Not on file    Review of Systems: ROS   Vitals:   10/14/23 0907  BP: 118/88  Pulse: (!) 106  Temp: 97.7 F (36.5 C)  TempSrc: Oral  SpO2: 98%  Weight: 124 lb (56.2 kg)  Height: 5' 4 (1.626 m)    Physical Exam: Physical Exam HENT:     Head: Normocephalic.  Eyes:     Extraocular Movements: Extraocular movements intact.  Neck:     Comments: Palpable right postauricular lymph node. No reproducible pain on palpation in the right neck region. Patient pointed to pain from his right occipital region down to the base of his neck Cardiovascular:     Rate and Rhythm: Normal rate and regular rhythm.  Pulmonary:     Effort: Pulmonary effort is normal.     Breath sounds: Normal breath sounds.  Musculoskeletal:     Cervical back: Normal range of motion. No rigidity.     Right lower leg: No edema.     Left lower leg: No edema.  Skin:    Comments: Maculopapular rash in both palms  Neurological:     General: No focal deficit present.     Mental Status: He is alert.     Cranial Nerves: No cranial nerve deficit.     Sensory: No sensory deficit.     Motor: No weakness.      Assessment & Plan:     Patient seen with Dr. Trudy  Assessment & Plan Neck pain on right side Based on patient's symptoms of right-sided neck pain with no numbness, tingling, shooting pain and physical exam findings we do not think he has cervical radiculopathy or suboccipital neuralgia.  We think his symptoms are more due to cervical muscle spasm or strain that could have been due to  his neck being in the same position for a long time.  Asked patient to take Tylenol  along with warm compress for relief. --Take Tylenol  and warm compress for pain relief GAD (generalized anxiety disorder) Patient has a lot going on with anxiety and stress.  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 This has taken a toll on his mood and sleep he wants a break from it.  Patient has a history of smoking and decided to quit smoking last week.  Encouraged patient to be away from smoking and informed him that he can reach out to us  he has difficulty staying away from smoking in the future.  He mentioned that it has been a week since he smoked his last cigarette.  Patient also uses marijuana and took it last night.  We believe patient would benefit from seeing our behavioral health therapist Ms.  Renda Pontes. --Referral for behavioral health therapist Ms. Renda Pontes placed Secondary syphilis Patient was seen in our office on 10/05/2022 regarding secondary syphilis.  He does have maculopapular rash in both his palms.  He mentions they are itchy sometimes has been using calamine lotion that seems to help.  Patient talked about generalized itchiness in his body as well and we discussed that he should be using moisturizers like Vaseline, Cetaphil to keep the skin from getting dry. --Continue doxycycline  twice daily to complete the 14-day course for syphilis --Use over-the-counter lotions like Vaseline, Cetaphil for generalized itchiness Human immunodeficiency virus (HIV) disease (HCC) Patient has been on Biktarvy  for HIV and takes it every day.  He follows Mr. Cordella July, FNP every 6 months for HIV. --Continue taking Biktarvy  daily Palpitations Patient has had episodes of palpitations after which he passes out.  He has seen a cardiologist for this with his last visit being in January 2025.  She was placed on a 4-week cardiac monitor.  Cardiac monitor results showed no sustained arrhythmias and  that his fatigue and chest pain occurred during normal sinus rhythm.  As the cardiac monitor results were unremarkable, cardiology did not suggest further cardiovascular testing. --We advised patient to keep himself hydrated really well and eat properly before he started any activity.   Orders Placed This Encounter  Procedures   AMB Referral VBCI Care Management    Referral Priority:   Routine    Referral Type:   Consultation    Referral Reason:   Care Coordination    Number of Visits Requested:   1     Rebecka Pion, D.O. [facility] Internal Medicine, PGY-1 Date 10/14/2023 Time 10:59 AM

## 2023-10-14 NOTE — Assessment & Plan Note (Signed)
 Patient has had episodes of palpitations after which he passes out.  He has seen a cardiologist for this with his last visit being in January 2025.  She was placed on a 4-week cardiac monitor.  Cardiac monitor results showed no sustained arrhythmias and that his fatigue and chest pain occurred during normal sinus rhythm.  As the cardiac monitor results were unremarkable, cardiology did not suggest further cardiovascular testing. --We advised patient to keep himself hydrated really well and eat properly before he started any activity.

## 2023-10-14 NOTE — Assessment & Plan Note (Signed)
 Patient has a lot going on with anxiety and stress.  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 This has taken a toll on his mood and sleep he wants a break from it.  Patient has a history of smoking and decided to quit smoking last week.  Encouraged patient to be away from smoking and informed him that he can reach out to us  he has difficulty staying away from smoking in the future.  He mentioned that it has been a week since he smoked his last cigarette.  Patient also uses marijuana and took it last night.  We believe patient would benefit from seeing our behavioral health therapist Ms.  Renda Pontes. --Referral for behavioral health therapist Ms. Renda Pontes placed

## 2023-10-15 ENCOUNTER — Ambulatory Visit: Payer: Self-pay

## 2023-10-15 ENCOUNTER — Telehealth: Payer: Self-pay | Admitting: *Deleted

## 2023-10-15 LAB — URINE CYTOLOGY ANCILLARY ONLY
Chlamydia: NEGATIVE
Comment: NEGATIVE
Comment: NORMAL
Neisseria Gonorrhea: NEGATIVE

## 2023-10-15 LAB — T-HELPER CELL (CD4) - (RCID CLINIC ONLY)
CD4 % Helper T Cell: 40 % (ref 33–65)
CD4 T Cell Abs: 1062 /uL (ref 400–1790)

## 2023-10-15 NOTE — Telephone Encounter (Signed)
 Call transferred from E2C2  Pt states his call was dropped Call transferred to [facility] triage nurse

## 2023-10-15 NOTE — Telephone Encounter (Signed)
 Call dropped during triage. 1st attempt to call patient back, no answer. Left voicemail for patient to call back for nurse triage. Patient still needs disposition and care advice.       Copied from CRM [id number]. Topic: Clinical - Red Word Triage >> Oct 15, 2023  3:06 PM Susanna ORN wrote: Red Word that prompted transfer to Nurse Triage: Patient states he's having side effects from medication that he took last night. He states he took one of the blue ones & the doxycycline  and he's had heavy itchiness throughout his body. States around his left breast plate, it's very itchy around the nipple and now it's very huge. Reason for Disposition  [1] MODERATE-SEVERE widespread itching (i.e., interferes with sleep, normal activities or school) AND [2] not improved after 24 hours of itching Care Advice  Answer Assessment - Initial Assessment Questions Patient with multiple complaints. Patient speaking in tangents about complaints and difficult to keep him focused on triage assessment. He states he was seen in the office yesterday but he states he feels like he needs to be admitted to the hospital to find some answers to his problems. He states he would like a full body scan to figure out what is wrong with him. Patient states he did not get a chance to speak with the provider about his migraines. Confirmed with patient that the blue pills are doxycyline. He states he was also on Bactrim  about a week and half ago but he has completed that prescription. He states he has held all of his medications today. Confirmed patient has not taken Biktarvy  and doxycyline today. He states he has tried Calamine and Cortisone creams/lotions so far for the itchiness.  1. DESCRIPTION: Describe the itching you are having.     He states the itchiness goes all over the body: ankles up back, on chest.   2. SEVERITY: How bad is it?      Severe yesterday, improved today.   3. SCRATCHING: Are there any scratch marks?  Bleeding?     He states he was scratching his left breast a lot. Patient states he has scratched til an ouch. Denies drawing any blood from scratches.  4. ONSET: When did this begin? (e.g., minutes, hours, days ago)      Yesterday, mid afternoon around 3pm up until midnight.  5. CAUSE: What do you think is causing the itching? (ask about swimming pools, pollen, animals, soaps, etc.)     He thinks this is a side effect from his medications.  6. OTHER SYMPTOMS: Do you have any other symptoms? (e.g., fever, rash)     Left breast swollen (mild), migraine headaches (back of head) with light sensitivity, he also states he has sores all over his body (size of a dime to penny; on feet/ankles/hands. Patient won't answer duration he continues to answer since day 1 of this) . Denies difficulty swallowing or breathing, facial swelling, tongue swelling.  7. PREGNANCY: Is there any chance you are pregnant? When was your last menstrual period?     N/A.  Protocols used: Itching - Widespread-A-AH

## 2023-10-15 NOTE — Telephone Encounter (Signed)
 I talked to Dr Norrine; stated pt should stop Bactrim  (Sulfa ) and continue Doxy. And if he develops any other rash, to send pictures via My Chart. Stated he understands. Then he stated he needs an Ophthalmology referral- c/o eye sensitivity, decreased vision.

## 2023-10-15 NOTE — Telephone Encounter (Signed)
 Call from pt - stated he's itching over today. Pt was seen by Dr Edgardo yesterday 8/14 and Doxy was prescribed. He also stated he's taking Sulfa  (Bactrim ); I do not see this med on his current med list. I asked him who prescribed Sulfa ; stated he went to [facility] for boil and STI testing.He stated yesterday when he took both meds, no problem. But today he has been itching, no other problems. I asked if he told the doctor yesterday that he's on Sulfa ; he stated no, he thought she could see it in the chart. Stated he will not take both. I told him will inform the doctor.

## 2023-10-15 NOTE — Telephone Encounter (Signed)
 Patient spoke with an RN at the office who contacted Dr. Norrine about his symptoms and relayed their instructions to the patient. Routing to clinic.

## 2023-10-17 LAB — CBC WITH DIFFERENTIAL/PLATELET
Absolute Lymphocytes: 2909 {cells}/uL (ref 850–3900)
Absolute Monocytes: 446 {cells}/uL (ref 200–950)
Basophils Absolute: 29 {cells}/uL (ref 0–200)
Basophils Relative: 0.6 %
Eosinophils Absolute: 130 {cells}/uL (ref 15–500)
Eosinophils Relative: 2.7 %
HCT: 43.7 % (ref 38.5–50.0)
Hemoglobin: 13.9 g/dL (ref 13.2–17.1)
MCH: 27.4 pg (ref 27.0–33.0)
MCHC: 31.8 g/dL — ABNORMAL LOW (ref 32.0–36.0)
MCV: 86 fL (ref 80.0–100.0)
MPV: 8.9 fL (ref 7.5–12.5)
Monocytes Relative: 9.3 %
Neutro Abs: 1286 {cells}/uL — ABNORMAL LOW (ref 1500–7800)
Neutrophils Relative %: 26.8 %
Platelets: 356 Thousand/uL (ref 140–400)
RBC: 5.08 Million/uL (ref 4.20–5.80)
RDW: 13.8 % (ref 11.0–15.0)
Total Lymphocyte: 60.6 %
WBC: 4.8 Thousand/uL (ref 3.8–10.8)

## 2023-10-17 LAB — COMPLETE METABOLIC PANEL WITHOUT GFR
AG Ratio: 1 (calc) (ref 1.0–2.5)
ALT: 31 U/L (ref 9–46)
AST: 31 U/L (ref 10–40)
Albumin: 3.7 g/dL (ref 3.6–5.1)
Alkaline phosphatase (APISO): 59 U/L (ref 36–130)
BUN: 14 mg/dL (ref 7–25)
CO2: 31 mmol/L (ref 20–32)
Calcium: 9.3 mg/dL (ref 8.6–10.3)
Chloride: 102 mmol/L (ref 98–110)
Creat: 1.06 mg/dL (ref 0.60–1.29)
Globulin: 3.6 g/dL (ref 1.9–3.7)
Glucose, Bld: 94 mg/dL (ref 65–99)
Potassium: 5.1 mmol/L (ref 3.5–5.3)
Sodium: 137 mmol/L (ref 135–146)
Total Bilirubin: 0.2 mg/dL (ref 0.2–1.2)
Total Protein: 7.3 g/dL (ref 6.1–8.1)

## 2023-10-17 LAB — LIPID PANEL
Cholesterol: 120 mg/dL (ref <200)
HDL: 36 mg/dL — ABNORMAL LOW (ref >=40)
LDL Cholesterol (Calc): 68 mg/dL
Non-HDL Cholesterol (Calc): 84 mg/dL (ref <130)
Total CHOL/HDL Ratio: 3.3 (calc) (ref <5.0)
Triglycerides: 84 mg/dL (ref <150)

## 2023-10-17 LAB — HIV-1 RNA QUANT-NO REFLEX-BLD
HIV 1 RNA Quant: NOT DETECTED {copies}/mL
HIV-1 RNA Quant, Log: NOT DETECTED {Log_copies}/mL

## 2023-10-17 LAB — RPR TITER: RPR Titer: 1:128 {titer} — ABNORMAL HIGH

## 2023-10-17 LAB — RPR: RPR Ser Ql: REACTIVE — AB

## 2023-10-17 LAB — T PALLIDUM AB: T Pallidum Abs: POSITIVE — AB

## 2023-10-19 NOTE — Progress Notes (Signed)
Internal Medicine Clinic Attending  I was physically present during the key portions of the resident provided service and participated in the medical decision making of patient's management care. I reviewed pertinent patient test results.  The assessment, diagnosis, and plan were formulated together and I agree with the documentation in the resident's note.  Williams, Julie Anne, MD  

## 2023-10-22 NOTE — Addendum Note (Signed)
 Addended by: Wanetta Funderburke on: 10/22/2023 11:18 PM   Modules accepted: Orders

## 2023-11-03 ENCOUNTER — Ambulatory Visit: Payer: Self-pay

## 2023-11-03 NOTE — Telephone Encounter (Signed)
 Pt has an appt 9/3 with Dr Harrie.

## 2023-11-03 NOTE — Telephone Encounter (Signed)
 FYI Only or Action Required?: FYI only for provider.  Patient was last seen in primary care on 10/14/2023 by Edgardo Pontiff, DO.  Called Nurse Triage reporting Diffuse Itchiness .  Symptoms began several weeks ago.  Symptoms are: gradually worsening.  Triage Disposition: See PCP When Office is Open (Within 3 Days)  Patient/caregiver understands and will follow disposition?: Yes         Copied from CRM #[id number]. Topic: Clinical - Red Word Triage >> Nov 03, 2023 11:11 AM Rodney Chase wrote: Red Word that prompted transfer to Nurse Triage: itching whole body/Since 2-3 weeks worse every other day         Reason for Disposition  [1] Widespread itching AND [2] cause unknown AND [3] present > 48 hours  (Exception: Caller knows the cause and can eliminate it.)  Answer Assessment - Initial Assessment Questions 1. DESCRIPTION: Describe the itching you are having.     Diffuse itchiness  2. SEVERITY: How bad is it?      Moderate  3. SCRATCHING: Are there any scratch marks? Bleeding?     No bleeding, looks like white circles on my legs 4. ONSET: When did this begin? (e.g., minutes, hours, days ago)      2-3 weeks ago  5. CAUSE: What do you think is causing the itching? (ask about swimming pools, pollen, animals, soaps, etc.)     Unsure  6. OTHER SYMPTOMS: Do you have any other symptoms? (e.g., fever, rash)     No  Protocols used: Itching - [facility]

## 2023-11-04 ENCOUNTER — Ambulatory Visit: Payer: Self-pay | Admitting: Student

## 2023-11-04 ENCOUNTER — Other Ambulatory Visit: Payer: Self-pay

## 2023-11-04 ENCOUNTER — Encounter: Payer: Self-pay | Admitting: Student

## 2023-11-04 DIAGNOSIS — B353 Tinea pedis: Secondary | ICD-10-CM | POA: Diagnosis present

## 2023-11-04 MED ORDER — CETIRIZINE HCL 10 MG PO TABS: 10.0000 mg | ORAL_TABLET | Freq: Every day | ORAL | 2 refills | Status: AC

## 2023-11-04 MED ORDER — CLOTRIMAZOLE 1 % EX CREA: 1.0000 | TOPICAL_CREAM | Freq: Two times a day (BID) | CUTANEOUS | 0 refills | Status: DC

## 2023-11-04 MED ORDER — CLOTRIMAZOLE 1 % EX CREA: 1.0000 | TOPICAL_CREAM | Freq: Two times a day (BID) | CUTANEOUS | 3 refills | Status: AC

## 2023-11-04 NOTE — Assessment & Plan Note (Addendum)
 Acute evaluation of itchiness of the bilateral lower extremities.  On exam he has a patchy white nonraised diffuse skin rash with epicenter at the ankle area bilaterally.  There are some involvement of the distal foot and proximal legs bilaterally rising to the level of the knees.  Noteworthy that he recently went underwent treatment for syphilis with doxycycline , a scrotal abscess with bactrim , and has a history of HIV disease on Biktarvy  with undetectable viral load and T-cells above 1000.  Aggressive emollient with vaseline is not improving it.  I do not think that we are seeing direct impact of these infections but perhaps sequela of their treatment with a fungal rash.  I further suspect there is underlying severe dry skin as well but the patchiness of the rash makes me concerned for fungal infection, which I will treat. If this treatment fails could consider an oral therapy or consider an alternative infectious or inflammatory process such as eczema, steroid therapy. - Clotrimazole  ointment twice daily for 3 weeks - Cetirizine  twice daily for symptom relief - Continue skin emollients

## 2023-11-04 NOTE — Patient Instructions (Signed)
 Clotrimazole  cream twice daily for 3 weeks or until rash resolves Cetirizine  twice daily until itching resolves

## 2023-11-04 NOTE — Progress Notes (Signed)
   CC: Skin itchiness and rash  HPI:  RodneyRodney Chase is a 48 y.o. male with a PMH stated below who presents today for evaluation of a skin rash.  Please see problem based assessment and plan for additional details.  Past Medical History:  Diagnosis Date   Floaters with photopsia    HIV (human immunodeficiency virus infection) (HCC)    Tobacco use    Review of Systems: ROS negative except for what is noted on the assessment and plan.  Vitals:   11/04/23 1049  TempSrc: Oral   Physical Exam: Constitutional: well-appearing man in no acute distress Cardiovascular: regular rate and rhythm, no m/r/g Pulmonary/Chest: normal work of breathing on room air, lungs clear to auscultation bilaterally MSK: normal bulk and tone Skin: bilateral lower extremities very dry with diffuse and also patchy white rash Psych: normal mood and behavior        Assessment & Plan:   Patient discussed with Dr. Karna  Tinea pedis Acute evaluation of itchiness of the bilateral lower extremities.  On exam he has a patchy white nonraised diffuse skin rash with epicenter at the ankle area bilaterally.  There are some involvement of the distal foot and proximal legs bilaterally rising to the level of the knees.  Noteworthy that he recently went underwent treatment for syphilis with doxycycline , a scrotal abscess with bactrim , and has a history of HIV disease on Biktarvy  with undetectable viral load and T-cells above 1000.  Aggressive emollient with vaseline is not improving it.  I do not think that we are seeing direct impact of these infections but perhaps sequela of their treatment with a fungal rash.  I further suspect there is underlying severe dry skin as well but the patchiness of the rash makes me concerned for fungal infection, which I will treat. If this treatment fails could consider an oral therapy or consider an alternative infectious or inflammatory process such as eczema, steroid therapy. -  Clotrimazole  ointment twice daily for 3 weeks - Cetirizine  twice daily for symptom relief - Continue skin emollients  RTC prn for this issue  Lonni Africa, D.O. [facility] Internal Medicine, PGY-2 Phone: [phone] Date 11/04/2023 Time 11:58 AM

## 2023-11-05 NOTE — Progress Notes (Signed)
 Internal Medicine Clinic Attending  Case discussed with the resident at the time of the visit.  We reviewed the resident's history and exam and pertinent patient test results.  I agree with the assessment, diagnosis, and plan of care documented in the resident's note.

## 2023-11-12 ENCOUNTER — Ambulatory Visit: Admitting: Family

## 2023-11-18 ENCOUNTER — Ambulatory Visit: Admitting: Licensed Clinical Social Worker

## 2023-11-18 DIAGNOSIS — F419 Anxiety disorder, unspecified: Secondary | ICD-10-CM

## 2023-11-29 NOTE — BH Specialist Note (Signed)
 [facility] attempted patient on 09/17 via telephone regarding his telehealth appointment . Patient will need to contact agency to reschedule.  Renda Pontes, MSW, LCSW-A She/Her Behavioral Health Clinician [facility]  Internal Medicine Center

## 2023-12-25 ENCOUNTER — Telehealth (INDEPENDENT_AMBULATORY_CARE_PROVIDER_SITE_OTHER): Payer: Self-pay | Admitting: Student

## 2023-12-25 DIAGNOSIS — Z8619 Personal history of other infectious and parasitic diseases: Secondary | ICD-10-CM

## 2023-12-25 DIAGNOSIS — A539 Syphilis, unspecified: Secondary | ICD-10-CM | POA: Diagnosis not present

## 2023-12-25 DIAGNOSIS — R238 Other skin changes: Secondary | ICD-10-CM | POA: Diagnosis present

## 2023-12-25 DIAGNOSIS — Z1211 Encounter for screening for malignant neoplasm of colon: Secondary | ICD-10-CM

## 2023-12-25 MED ORDER — CAMPHOR-MENTHOL 0.5-0.5 % EX LOTN: 1.0000 | TOPICAL_LOTION | CUTANEOUS | 0 refills | Status: AC | PRN

## 2023-12-25 NOTE — Progress Notes (Signed)
 Virtual Visit via Video Note  I connected with Rodney Chase on 12/25/23 at  9:15 AM EDT by a video enabled telemedicine application and verified that I am speaking with the correct person using two identifiers.  Patient Location: Other:  [employer] (parked) Provider Location: Office/Clinic  I discussed the limitations, risks, security, and privacy concerns of performing an evaluation and management service by video and the availability of in person appointments. I also discussed with the patient that there may be a patient responsible charge related to this service. The patient expressed understanding and agreed to proceed.  Subjective: PCP: D'Mello, Rosalyn, DO  CC: skin sensitivity   HPI:  Acute visit to discuss skin sensitivity. Reports increased skin sensitivity for past 4-5 days of his left lateral chest/abdomen wall, wraps around. Reports possible prior hx of this occurring before then self resolving. Denies rash, lesions, blisters, erythema, skin changes of the affected area. Confirmed x 3 no skin changes in the past few days. No environmental or product changes. No sensitivity elsewhere. Can feel his clothes brushing against that area. Denies sick symptoms of fever, chills, n/v, chest pain or SOB. Noted recent OV for rashes of feet and hand, see below paragraph.   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 Had UC visit for penile lesions treated w/ Bactrim . Also treated for tinea pedis at 9/3 OV w/ clotrimazole  x 3 weeks.   Of note, states not sexually active but was active w/ 1 male partner prior to UC visit. Not sure if partner has skin lesions but possible. Not in contact w/ that person anymore.    ROS: Per HPI  Current Outpatient Medications:    camphor-menthol (SARNA) lotion, Apply 1 Application topically as needed for itching., Disp: 222 mL, Rfl: 0   bictegravir-emtricitabine -tenofovir  AF  (BIKTARVY ) 50-200-25 MG TABS tablet, Take 1 tablet by mouth daily., Disp: 30 tablet, Rfl: 3   cetirizine  (ZYRTEC  ALLERGY) 10 MG tablet, Take 1 tablet (10 mg total) by mouth daily., Disp: 30 tablet, Rfl: 2   clotrimazole  (CLOTRIMAZOLE  ANTI-FUNGAL) 1 % cream, Apply 1 Application topically 2 (two) times daily., Disp: 113 g, Rfl: 3   Ensure (ENSURE), Take 237 mLs by mouth 2 (two) times daily between meals., Disp: 237 mL, Rfl: 11  Observations/Objective: There were no vitals filed for this visit. Physical Exam Constitutional:      General: He is not in acute distress.    Appearance: Normal appearance. He is not ill-appearing.  Pulmonary:     Effort: Pulmonary effort is normal.  Skin:    Comments: Patient denies any skin lesions or changes. Unable to fully visualize/assess left lateral chest and abdomen via telehealth camera.   Neurological:     Mental Status: He is alert.     Assessment and Plan: Assessment & Plan Skin sensitivity Acute telehealth for allodynia based off HPI. Patient denies rash, lesions, blisters, skin changes of the concerned area after multiple questioning regarding skin changes. Initially thoughts of possible Zoster given lateral and not crossing midline. However, no lesions/rashes have formed per patient. Difficult to assess over telehealth so recommend in-person visit to visualize area. He is due for OV for repeat titer post syphilis treatment. HIV was undetectable in 10/2023 with CD4 count 1062 on Biktarvy .   Plan -Needs in-person OV soon for this and for syphilis f/u  -Return precautions discussed if rashes, blisters, skin changes develop to call office  or seek UC/ED -Discussed OTC emollients or sarna to help for now, but will f/u w/ in-person OV    Syphilis From 8/4 OV, maculopapular rash of hands treated w/ doxycycline  x 14 days. Titer 1:128. Had 1 male partner. Not sexually active since then.   Plan -Needs repeat titer (anticipate 4 fold reduction if  appropriately treated) -Confirm w/ [facility] if this was reported, patient unsure     Colon cancer screening Discussed colon cancer screening. Reports possible family hx. Patient agreeable for referral sent today.   Orders:   Ambulatory referral to Gastroenterology    Follow Up Instructions: Return in about 2-3 weeks (around 01/08/2024) for in-person evaluation and syphilis follow-up.     The patient was advised to call back or seek an in-person evaluation if the symptoms worsen or if the condition fails to improve as anticipated.    Patient discussed with Dr. Hoffman  Lisandra Mathisen, DO

## 2023-12-27 ENCOUNTER — Encounter: Payer: Self-pay | Admitting: Student

## 2023-12-27 NOTE — Assessment & Plan Note (Addendum)
 From 8/4 OV, maculopapular rash of hands treated w/ doxycycline  x 14 days. Titer 1:128. Had 1 male partner. Not sexually active since then.   Plan -Needs repeat titer (anticipate 4 fold reduction if appropriately treated) -Confirm w/ [facility] if this was reported, patient unsure

## 2023-12-29 ENCOUNTER — Ambulatory Visit: Payer: Self-pay

## 2023-12-29 DIAGNOSIS — H538 Other visual disturbances: Secondary | ICD-10-CM

## 2023-12-29 NOTE — Telephone Encounter (Signed)
 FYI Only or Action Required?: FYI only for provider.  Patient was last seen in primary care on 12/25/2023 by Elicia Sharper, DO.  Called Nurse Triage reporting Blurred Vision.  Symptoms began several months ago.  Interventions attempted: Nothing.  Symptoms are: migraine headaches with light sensitivity, right facial and temple pain/sensitivity, blurry and double vision at night and when reading fine print, left sided flank/abdomen skin sensitivity; concerned for low iron due to feeling cold stable.  Triage Disposition: See Physician Within 24 Hours (overriding See PCP Within 2 Weeks)  Patient/caregiver understands and will follow disposition?: Yes            Copied from CRM [id number]. Topic: Clinical - Red Word Triage >> Dec 29, 2023 11:01 AM Rodney Chase ORN wrote: Red Word that prompted transfer to Nurse Triage: patient have blurry vision Reason for Disposition  [1] Blurred vision or visual changes AND [2] gradual onset (e.g., weeks, months)  Answer Assessment - Initial Assessment Questions Patient having difficulty answering triage questions and keeping focus. Patient speaking in tangents about low iron, weight loss, his father's cancer diagnosis, his aunt recommending blood work, feeling cold, wanting a full body scan to check for cancer, abdominal pain with  scratch. RN attempted redirected patient to emergent symptoms. Patient educated on stroke symptoms and emergent symptoms to go to ED or call 911 for. Scheduled with provider tomorrow and given strict ED precautions.   1. DESCRIPTION: How has your vision changed? (e.g., complete vision loss, blurred vision, double vision, floaters, etc.)     Blurry vision and double vision.  2. LOCATION: One or both eyes? If one, ask: Which eye?     Both eyes.  3. SEVERITY: Can you see anything? If Yes, ask: What can you see? (e.g., fine print)     Yes; he states during the day time he thinks his vision is good. He states he  notices the blurry and double vision when reading fine print and at night. He states when he wears the reading glasses it resolves.  4. ONSET: When did this begin? Did it start suddenly or has this been gradual?     Patient states he doesn't keep up with timelines and unsure how long. Gradually over several months. Not currently experiencing.  5. PATTERN: Does this come and go, or has it been constant since it started?     Comes and goes.  6. PAIN: Is there any pain in your eye(s)?  (Scale 1-10; or mild, moderate, severe)     No.  7. CONTACTS-GLASSES: Do you wear contacts or glasses?     He states he wears reader glasses.  8. CAUSE: What do you think is causing this visual problem?     Unsure if related to migraines. He states when he was young he would have migraines all the time and worse glasses as a child. He thinks he needs to be prescribed glasses.  9. OTHER SYMPTOMS: Do you have any other symptoms? (e.g., confusion, headache, arm or leg weakness, speech problems)     Migraine headaches with sensitivity to light, right upper face tenderness/sensitivity and pain. Patient also complains of left sided flank/abdominal sensitivity to skin, sharp and itchy. Patient also states he is always feeling cold and thinks he needs to have his iron checked.  Protocols used: Vision Loss or Change-A-AH

## 2023-12-29 NOTE — Telephone Encounter (Signed)
 Copied from CRM [id number]. Topic: Clinical - Medical Advice >> Dec 29, 2023 10:58 AM Alfonso ORN wrote: Reason for CRM: patient was seen on 12/25/23 and discuss his skin issue , patient checking on the status of getting cream prescribe for him  Please contact patient on status    ----------------------------------------------------------------------- From previous Reason for Contact - Medication Question: Reason for CRM:

## 2023-12-30 ENCOUNTER — Encounter: Payer: Self-pay | Admitting: Student

## 2023-12-30 ENCOUNTER — Ambulatory Visit (INDEPENDENT_AMBULATORY_CARE_PROVIDER_SITE_OTHER): Payer: Self-pay | Admitting: Student

## 2023-12-30 ENCOUNTER — Other Ambulatory Visit: Payer: Self-pay

## 2023-12-30 VITALS — Temp 98.1°F | Resp 28 | Ht 64.0 in | Wt 125.8 lb

## 2023-12-30 DIAGNOSIS — R42 Dizziness and giddiness: Secondary | ICD-10-CM

## 2023-12-30 DIAGNOSIS — F17210 Nicotine dependence, cigarettes, uncomplicated: Secondary | ICD-10-CM | POA: Diagnosis not present

## 2023-12-30 DIAGNOSIS — B20 Human immunodeficiency virus [HIV] disease: Secondary | ICD-10-CM

## 2023-12-30 DIAGNOSIS — A539 Syphilis, unspecified: Secondary | ICD-10-CM | POA: Diagnosis present

## 2023-12-30 DIAGNOSIS — R519 Headache, unspecified: Secondary | ICD-10-CM

## 2023-12-30 DIAGNOSIS — H538 Other visual disturbances: Secondary | ICD-10-CM | POA: Diagnosis not present

## 2023-12-30 DIAGNOSIS — Z79899 Other long term (current) drug therapy: Secondary | ICD-10-CM

## 2023-12-30 NOTE — Assessment & Plan Note (Signed)
 Completed doxycycline  in August. Due to titer recheck. Not sexually active since prior OV. Negative G/C. HIV on Biktarvy  and undetectable.   Orders:   RPR

## 2023-12-30 NOTE — Patient Instructions (Addendum)
 Thank you, Rodney Chase for allowing us  to provide your care today. Today we discussed:  -With your new symptoms, I recommend going to the emergency room to further evaluate with consideration of MRI brain w contrast.    -Blood work to check syphilis   I have ordered the following labs for you:  Lab Orders         RPR       Should you have any questions or concerns please call the internal medicine clinic at [phone].    Sidrah Harden, D.O. [facility] Internal Medicine Center

## 2023-12-30 NOTE — Progress Notes (Signed)
 CC: Acute visit  HPI: Rodney Chase is a 48 y.o. male living with a history stated below and presents today for acute visit. Please see problem based assessment and plan for additional details.  Past Medical History:  Diagnosis Date   Floaters with photopsia    HIV (human immunodeficiency virus infection) (HCC)    Tobacco use     Current Outpatient Medications on File Prior to Visit  Medication Sig Dispense Refill   bictegravir-emtricitabine -tenofovir  AF (BIKTARVY ) 50-200-25 MG TABS tablet Take 1 tablet by mouth daily. 30 tablet 3   camphor-menthol (SARNA) lotion Apply 1 Application topically as needed for itching. 222 mL 0   cetirizine  (ZYRTEC  ALLERGY) 10 MG tablet Take 1 tablet (10 mg total) by mouth daily. 30 tablet 2   clotrimazole  (CLOTRIMAZOLE  ANTI-FUNGAL) 1 % cream Apply 1 Application topically 2 (two) times daily. 113 g 3   Ensure (ENSURE) Take 237 mLs by mouth 2 (two) times daily between meals. 237 mL 11   No current facility-administered medications on file prior to visit.    Family History  Problem Relation Age of Onset   Breast cancer Mother        breast   Prostate cancer Father    Prostate cancer Brother    Cancer Maternal Grandmother    Diabetes Paternal Grandmother    Diabetes Daughter    Lung cancer Maternal Aunt     Social History   Socioeconomic History   Marital status: Single    Spouse name: Not on file   Number of children: Not on file   Years of education: Not on file   Highest education level: Not on file  Occupational History   Not on file  Tobacco Use   Smoking status: Every Day    Current packs/day: 0.30    Types: Cigarettes   Smokeless tobacco: Never  Vaping Use   Vaping status: Never Used  Substance and Sexual Activity   Alcohol use: Not Currently    Comment: occasionally   Drug use: Yes    Types: Marijuana    Comment: daily   Sexual activity: Not Currently    Partners: Male  Other Topics Concern   Not on file  Social  History Narrative   Lives with roommate   Care giver   Highest level of education: 12th   Social Drivers of Health   Financial Resource Strain: Medium Risk (11/04/2023)   Overall Financial Resource Strain (CARDIA)    Difficulty of Paying Living Expenses: Somewhat hard  Food Insecurity: Low Risk  (04/29/2023)   Received from [facility]   Hunger Vital Sign    Within the past 12 months, you worried that your food would run out before you got money to buy more: Never true    Within the past 12 months, the food you bought just didn't last and you didn't have money to get more. : Never true  Transportation Needs: No Transportation Needs (04/29/2023)   [facility]   Transportation    In the past 12 months, has lack of reliable transportation kept you from medical appointments, meetings, work or from getting things needed for daily living? : No  Physical Activity: Sufficiently Active (11/04/2023)   Exercise Vital Sign    Days of Exercise per Week: 4 days    Minutes of Exercise per Session: 70 min  Stress: No Stress Concern Present (11/04/2023)   [employer] of Occupational Health - Occupational Stress Questionnaire    Feeling of  Stress: Not at all  Social Connections: Unknown (11/04/2023)   Social Connection and Isolation Panel    Frequency of Communication with Friends and Family: Three times a week    Frequency of Social Gatherings with Friends and Family: Once a week    Attends Religious Services: Not on Marketing Executive or Organizations: Not on file    Attends Banker Meetings: Not on file    Marital Status: Not on file  Intimate Partner Violence: Not At Risk (11/04/2023)   Humiliation, Afraid, Rape, and Kick questionnaire    Fear of Current or Ex-Partner: No    Emotionally Abused: No    Physically Abused: No    Sexually Abused: No    Review of Systems: ROS negative except for what is noted on the assessment and plan.  Vitals:    12/30/23 1107  Resp: (!) 28  Temp: 98.1 F (36.7 C)  TempSrc: Oral  SpO2: 100%  Weight: 125 lb 12.8 oz (57.1 kg)  Height: 5' 4 (1.626 m)   Physical Exam Constitutional:      General: He is not in acute distress.    Appearance: He is ill-appearing. He is not diaphoretic.  HENT:     Head: Normocephalic.  Eyes:     Extraocular Movements: Extraocular movements intact.     Right eye: No nystagmus.     Left eye: No nystagmus.     Pupils: Pupils are equal, round, and reactive to light.     Comments: Grossly intact visual fields including peripheral vision testing.   Cardiovascular:     Rate and Rhythm: Normal rate and regular rhythm.  Pulmonary:     Effort: Pulmonary effort is normal.  Skin:    General: Skin is warm and dry.     Findings: No lesion or rash.  Neurological:     General: No focal deficit present.     Mental Status: He is alert.     Cranial Nerves: No cranial nerve deficit.     Motor: Weakness present.    Assessment & Plan:   Assessment & Plan Blurry vision, bilateral Nonintractable headache, unspecified chronicity pattern, unspecified headache type Dizziness Presents for acute visit today. States b/l blurry vision for few months but worsening. Tried OTC reading glass w/ some improvement. Associated w/ frontal headaches. Today he had worsening weakness, fatigue, nausea and dizziness. States room is spinning. Felt unsteady this AM.   Recent treatment of secondary syphilis in 10/2023 w/ doxycycline . Had UC for penile ulceration followed by maculopapular rash of hands then treated for tinea pedis.   No focal neuro deficits w/ grossly intact CN, 5/5 strength bilaterally. However felt unstable with transition from chair to wheelchair. Patient appears uncomfortable but vitals are stable. Afebrile. Orthostatic vitals are negative today.   Consideration of MRI brain w contrast to evaluate given acute symptoms and recent hx of secondary syphilis. To expedite imaging, will  send patient to ED. Patient agreeable to ED evaluation.     Syphilis Completed doxycycline  in August. Due to titer recheck. Not sexually active since prior OV. Negative G/C. HIV on Biktarvy  and undetectable.   Orders:   RPR   We discussed transport by EMS due to dizziness, unsteadiness but patient declined. We discussed the risks of POV but he voices understanding and will go by POV.    Patient discussed with Dr. Francesco Ozell Nearing, D.O. [facility] Internal Medicine, PGY-3 Clinic Phone: [phone] Date 12/30/2023 Time 12:12 PM

## 2023-12-30 NOTE — Telephone Encounter (Signed)
 No recent Referral have been placed at his LOV on 12/25/2023, nor did the patient mention needing a referral.  Pt has sch an appt for today to be seen:   Name: Rodney Chase, Rodney Chase MRN: [id number]  Date: 12/30/2023 Status: Sch  Time: 10:45 AM Length: 30  Visit Type: ACUTE [8007] Copay: $0.00  Provider: Elicia Sharper, DO Department: [facility]   Copied from CRM [id number]. Topic: Referral - Status >> Dec 29, 2023 10:59 AM Alfonso ORN wrote: Reason for CRM: patient checking on status of referral for the his eye and foot  Was discuss at last office visit

## 2023-12-31 NOTE — Progress Notes (Signed)
 Internal Medicine Clinic Attending  Case discussed with the resident at the time of the visit.  We reviewed the resident's history and exam and pertinent patient test results.  I agree with the assessment, diagnosis, and plan of care documented in the resident's note.

## 2024-01-01 ENCOUNTER — Ambulatory Visit: Payer: Self-pay | Admitting: Student

## 2024-01-01 LAB — RPR, QUANT+TP ABS (REFLEX)
Rapid Plasma Reagin, Quant: 1:8 {titer} — ABNORMAL HIGH
T Pallidum Abs: REACTIVE — AB

## 2024-01-01 LAB — RPR: RPR Ser Ql: REACTIVE — AB

## 2024-01-04 NOTE — Addendum Note (Signed)
 Addended by: ELICIA SHARPER on: 01/04/2024 09:00 AM   Modules accepted: Orders

## 2024-01-11 ENCOUNTER — Telehealth: Payer: Self-pay | Admitting: *Deleted

## 2024-01-11 ENCOUNTER — Ambulatory Visit: Admitting: Podiatry

## 2024-01-11 NOTE — Telephone Encounter (Signed)
 Copied from CRM [id number]. Topic: General - Other >> Jan 11, 2024  8:51 AM Rodney Chase wrote: Reason for CRM: Patient stated he was seen and discussed a referral for his eyes because he has trouble seeing, it gets blurry and has light sensitivity. He wants someone to write him a note/letter stating that he can get his windows tinted/darkened. Call back number is [phone] if any questions and contact patient when letter/note is ready.

## 2024-01-12 NOTE — Addendum Note (Signed)
 Addended by: ELICIA SHARPER on: 01/12/2024 01:56 PM   Modules accepted: Orders

## 2024-01-20 ENCOUNTER — Ambulatory Visit (HOSPITAL_COMMUNITY): Attending: Internal Medicine

## 2024-01-20 ENCOUNTER — Ambulatory Visit (INDEPENDENT_AMBULATORY_CARE_PROVIDER_SITE_OTHER): Admitting: Podiatry

## 2024-01-20 ENCOUNTER — Encounter: Payer: Self-pay | Admitting: Podiatry

## 2024-01-20 VITALS — Ht 64.0 in | Wt 125.8 lb

## 2024-01-20 DIAGNOSIS — M79674 Pain in right toe(s): Secondary | ICD-10-CM | POA: Diagnosis not present

## 2024-01-20 DIAGNOSIS — M79675 Pain in left toe(s): Secondary | ICD-10-CM

## 2024-01-20 DIAGNOSIS — B351 Tinea unguium: Secondary | ICD-10-CM

## 2024-01-20 MED ORDER — CLOTRIMAZOLE-BETAMETHASONE 1-0.05 % EX CREA: 1.0000 | TOPICAL_CREAM | Freq: Every day | CUTANEOUS | 2 refills | Status: AC

## 2024-01-20 MED ORDER — TERBINAFINE HCL 250 MG PO TABS
250.0000 mg | ORAL_TABLET | Freq: Every day | ORAL | 0 refills | Status: AC
Start: 2024-01-20 — End: ?

## 2024-01-20 NOTE — Progress Notes (Signed)
   Chief Complaint  Patient presents with   Nail Problem    Pt is here due to bilateral toenails, states he has some discoloration to the toenails possible fungus, complains of tinea pedis.    Subjective: 48 y.o. male presenting today as a new patient for evaluation of thick discoloration to the toenails bilateral  Past Medical History:  Diagnosis Date   Floaters with photopsia    HIV (human immunodeficiency virus infection) (HCC)    Tobacco use     Past Surgical History:  Procedure Laterality Date   NO PAST SURGERIES      Allergies  Allergen Reactions   Penicillins     Has patient had a PCN reaction causing immediate rash, facial/tongue/throat swelling, SOB or lightheadedness with hypotension: YES Has patient had a PCN reaction causing severe rash involving mucus membranes or skin necrosis: NO Has patient had a PCN reaction that required hospitalization: UNK Has patient had a PCN reaction occurring within the last 10 years: NO If all of the above answers are NO, then may proceed with Cephalosporin use.    B/L feet 01/20/2024  Objective: Physical Exam General: The patient is alert and oriented x3 in no acute distress.  Dermatology: Hyperkeratotic, discolored, thickened, onychodystrophy noted. Skin is warm, dry and supple bilateral lower extremities.  Diffuse hyperkeratotic skin also noted to the bilateral feet  Vascular: Palpable pedal pulses bilaterally. No edema or erythema noted. Capillary refill within normal limits.  Neurological: Grossly intact via light touch  Musculoskeletal Exam: No pedal deformity noted  Assessment: #1 Onychomycosis of toenails bilateral #2 chronic tinea pedis bilateral  Plan of Care:  #1 Patient was evaluated.  Mechanical debridement of nails 1-5 bilateral performed using a nail nipper without incident or bleeding #2  Today we discussed different treatment options including oral, topical, and laser antifungal treatment modalities.  We  discussed their efficacies and side effects.  Patient opts for oral antifungal treatment modality #3 prescription for Lamisil  250 mg #90 daily. Pt denies a history of liver pathology or symptoms.  CMP 10/14/2023 hepatic function WNL #4  Prescription for Lotrisone cream apply twice daily  #5 return to clinic 6 months   Thresa EMERSON Sar, DPM [facility]  Dr. Thresa EMERSON Sar, DPM    [address] [place], [place] [address]                Office [phone]  Fax [phone]

## 2024-02-01 ENCOUNTER — Ambulatory Visit (HOSPITAL_COMMUNITY)

## 2024-02-05 ENCOUNTER — Ambulatory Visit (HOSPITAL_COMMUNITY): Admission: RE | Admit: 2024-02-05 | Discharge: 2024-02-05 | Attending: Internal Medicine | Admitting: Internal Medicine

## 2024-02-05 ENCOUNTER — Encounter (HOSPITAL_COMMUNITY): Payer: Self-pay

## 2024-02-05 DIAGNOSIS — R519 Headache, unspecified: Secondary | ICD-10-CM

## 2024-02-05 DIAGNOSIS — H538 Other visual disturbances: Secondary | ICD-10-CM

## 2024-02-05 DIAGNOSIS — R42 Dizziness and giddiness: Secondary | ICD-10-CM

## 2024-02-05 MED ORDER — GADOBUTROL 1 MMOL/ML IV SOLN
5.0000 mL | Freq: Once | INTRAVENOUS | Status: AC | PRN
  Administered 2024-02-05: 5 mL via INTRAVENOUS

## 2024-02-08 ENCOUNTER — Ambulatory Visit: Payer: Self-pay

## 2024-02-08 NOTE — Telephone Encounter (Signed)
 FYI Only or Action Required?: FYI only for provider: appointment scheduled on 02/10/24. No sooner appt available.  Patient was last seen in primary care on 12/30/2023 by Elicia Sharper, DO.  Called Nurse Triage reporting Ear Pain.  Symptoms began several days ago.  Interventions attempted: Nothing.  Symptoms are: unchanged.  Triage Disposition: See Physician Within 24 Hours  Patient/caregiver understands and will follow disposition?: Yes  Reason for Disposition  Earache  (Exceptions: Brief ear pain of lasting less than 60 minutes, or earache occurring during air travel.)  Answer Assessment - Initial Assessment Questions Patient calls in stating that he had an MRI done on Friday in which they used ear plugs. He states that since Friday night he has had this sharp pain that comes and goes in his ears. He states he feels this pain in both ears, but more on the left side. He also complains of back and shoulder pain after having to lay on the MRI table for 25 mins that has been continuous. He denies any discharge, fevers, trouble hearing. He mentions that he does get dizziness/lightheadedness and has blackout moments, but that he has experienced this for years and has been seen by cardiologist for these symptoms. Also expresses concern for a groin pain that he has been feeling, states this is not new but he forgot to mention it at his last visit. Advised to come in for office visit, appt scheduled 02/10/24.  1. LOCATION: Which ear is involved?     Bilateral but more on Left side  2. ONSET: When did the ear pain start?      Friday after MRI  3. SEVERITY: How bad is the pain?  (Scale 1-10; mild, moderate or severe)     Mild ache constant; 10/10 sharp pain that comes and goes  4. URI SYMPTOMS: Do you have a runny nose or cough?     Congestion-states he tends to have congestion often with weather  5. FEVER: Do you have a fever? If Yes, ask: What is your temperature, how was it  measured, and when did it start?     No  6. CAUSE: Have you been swimming recently?, How often do you use Q-TIPS?, Have you had any recent air travel or scuba diving?     No  7. OTHER SYMPTOMS: Do you have any other symptoms? (e.g., decreased hearing, dizziness, headache, stiff neck, vomiting)     lightheaded/dizziness prior to ear pain  8. PREGNANCY: Is there any chance you are pregnant? When was your last menstrual period?     NA  Protocols used: Earache-A-AH  Reason for Disposition  Earache  (Exceptions: Brief ear pain of lasting less than 60 minutes, or earache occurring during air travel.)  Answer Assessment - Initial Assessment Questions Patient calls in stating that he had an MRI done on Friday in which they used ear plugs. He states that since Friday night he has had this sharp pain that comes and goes in his ears. He states he feels this pain in both ears, but more on the left side. He denies any discharge, fevers, trouble hearing. He mentions that he does get dizziness/lightheadedness and has blackout moments, but that he has experienced this for years and has been seen by cardiologist for these symptoms. Also expresses concern for a groin pain that he has been feeling, states this is not new but he forgot to mention it at his last visit. Advised to come in for office visit, appt scheduled 02/10/24.  1.  LOCATION: Which ear is involved?     Bilateral but more on Left side  2. ONSET: When did the ear pain start?      Friday after MRI  3. SEVERITY: How bad is the pain?  (Scale 1-10; mild, moderate or severe)     Mild ache constant; 10/10 sharp pain that comes and goes  4. URI SYMPTOMS: Do you have a runny nose or cough?     Congestion-states he tends to have congestion often with weather  5. FEVER: Do you have a fever? If Yes, ask: What is your temperature, how was it measured, and when did it start?     No  6. CAUSE: Have you been swimming  recently?, How often do you use Q-TIPS?, Have you had any recent air travel or scuba diving?     No  7. OTHER SYMPTOMS: Do you have any other symptoms? (e.g., decreased hearing, dizziness, headache, stiff neck, vomiting)     lightheaded/dizziness prior to ear pain  8. PREGNANCY: Is there any chance you are pregnant? When was your last menstrual period?     NA  Protocols used: Earache-A-AH  Copied from CRM #[id number]. Topic: Clinical - Red Word Triage >> Feb 08, 2024  4:20 PM DeAngela L wrote: Red Word that prompted transfer to Nurse Triage: patient calling cause he is having really sharp pain in both his ears and more so the left ear seems like an ear ache, after having an MRI recently and the pain in just random fast sharp piercing when the air or just walking around all of a sudden its a sharp pain and it has to pass on it's on, and also the patients back upper mid back and right shoulder blade after laying on the MRI table for 25 minutes and this is continual and kind of feels like a spine type of pain  Pt num [phone] (M)

## 2024-02-10 ENCOUNTER — Encounter: Payer: Self-pay | Admitting: Student

## 2024-02-10 ENCOUNTER — Other Ambulatory Visit: Payer: Self-pay

## 2024-02-10 ENCOUNTER — Other Ambulatory Visit (HOSPITAL_COMMUNITY)
Admission: RE | Admit: 2024-02-10 | Discharge: 2024-02-10 | Disposition: A | Discharge status: Home / Self Care | Source: Ambulatory Visit | Attending: Internal Medicine | Admitting: Internal Medicine

## 2024-02-10 ENCOUNTER — Ambulatory Visit: Payer: Self-pay | Admitting: Student

## 2024-02-10 VITALS — BP 124/77 | HR 83 | Temp 98.7°F | Ht 64.0 in | Wt 124.2 lb

## 2024-02-10 DIAGNOSIS — Z7251 High risk heterosexual behavior: Secondary | ICD-10-CM

## 2024-02-10 DIAGNOSIS — Z8619 Personal history of other infectious and parasitic diseases: Secondary | ICD-10-CM

## 2024-02-10 DIAGNOSIS — R300 Dysuria: Secondary | ICD-10-CM | POA: Insufficient documentation

## 2024-02-10 DIAGNOSIS — H9202 Otalgia, left ear: Secondary | ICD-10-CM

## 2024-02-10 NOTE — Assessment & Plan Note (Addendum)
 Completed course of doxycycline  (due to penicillin allergy) in August. RPR in 4 months to confirm ongoing decrease in titer. Orders:   RPR; Future

## 2024-02-10 NOTE — Patient Instructions (Signed)
 VISIT SUMMARY: You came in today with urinary symptoms and concerns about prostate cancer, especially given your family history. We discussed your symptoms, including difficulty urinating, frequent urination, and prostate pain. We also addressed your concerns about sexually transmitted infections and your need for colorectal cancer screening.  YOUR PLAN: LOWER URINARY TRACT SYMPTOMS: You have been experiencing difficulty initiating urination, a weak stream, and a burning sensation after urination. -We ordered a urine test to check for sexually transmitted infections. -Please follow up if your symptoms persist.  PROSTATE PAIN AND CANCER CONCERN: You are concerned about prostate cancer due to your family history. -Your last PSA test in July was normal, making prostate cancer unlikely at this time. -We discussed regular PSA screening every 1-2 years due to your family history.  RISK FOR SEXUALLY TRANSMITTED INFECTION: You are sexually active with men and use condoms, but you are still concerned about STIs. -We ordered a urine test to check for sexually transmitted infections.  CONSTIPATION: You reported constipation and a sensation of incomplete evacuation. -We recommended taking Miralax for a few days.  SCREENING FOR COLORECTAL CANCER: You are due for colorectal cancer screening, especially given your previous rectal findings. -We sent a referral for a screening colonoscopy.  Take your prescription medications as usual on the day of your doctor visit. Unless specifically instructed, there is no need to fast prior to laboratory blood testing.  Bring all of the medications that you take (including over the counter medications and supplements) with you to every clinic visit.  This after visit summary is an important review of tests, referrals, and medication changes that were discussed during your visit. If you have questions or concerns, call [phone]. Outside of clinic business hours,  call the main hospital at [phone] and ask the operator for the on-call internal medicine resident.   [name] MD 02/10/2024, 4:27 PM

## 2024-02-10 NOTE — Progress Notes (Signed)
 Patient name: Rodney Chase Date of birth: June 15, 1975 Date of visit: 02/10/24  Subjective  Reason for visit: Acute Visit  Discussed the use of AI scribe software for clinical note transcription with the patient, who gave verbal consent to proceed.  History of Present Illness   Rodney Chase is a 48 year old male who presents with urinary symptoms and concerns about prostate cancer.  Lower urinary tract symptoms - 6 days of difficulty initiating urination - Weak urinary stream requiring straining - Sensation of incomplete bladder emptying - Increased urinary frequency during day and night - Burning sensation after urination - No hematuria  Pelvic pain and cancer concern - Significant anxiety regarding prostate cancer due to twin brother's recent diagnosis of stage 4 disease - Last PSA in July was normal  Sexual history and sti risk - Sexually active with men, including receptive sex - Consistent condom use - Concern about possible sexually transmitted infection  Rectal symptoms and history - History of small rectal growth removed at [facility] after enlargement - No current rectal symptoms - 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  Otolaryngologic symptoms - History of sharp, unpredictable left ear pain after MRI, now resolved with sweet oil drops - No current ear symptoms       Outpatient Medications Prior to Visit  Medication Sig   bictegravir-emtricitabine -tenofovir  AF (BIKTARVY ) 50-200-25 MG TABS tablet Take 1 tablet by mouth daily.   camphor-menthol  (SARNA) lotion Apply 1 Application topically as needed for itching.   cetirizine  (ZYRTEC  ALLERGY) 10 MG tablet Take 1 tablet (10 mg total) by mouth daily.   clotrimazole  (CLOTRIMAZOLE  ANTI-FUNGAL) 1 % cream Apply 1 Application topically 2 (two) times daily.   clotrimazole -betamethasone   (LOTRISONE ) cream Apply 1 Application topically daily.   Ensure (ENSURE) Take 237 mLs by mouth 2 (two) times daily between meals.   terbinafine  (LAMISIL ) 250 MG tablet Take 1 tablet (250 mg total) by mouth daily.   No facility-administered medications prior to visit.     Objective  Today's Vitals   02/10/24 1551 02/10/24 1556  BP: (!) 115/102 124/77  Pulse: 80 83  Temp: 98.7 F (37.1 C)   TempSrc: Oral   SpO2: 100%   Weight: 124 lb 3.2 oz (56.3 kg)   Height: 5' 4 (1.626 m)   Body mass index is 21.32 kg/m.   Physical Exam Constitutional:      Appearance: Normal appearance.  HENT:     Right Ear: Tympanic membrane normal.     Left Ear: Tympanic membrane and external ear normal.     Ears:     Comments: Some tenderness on otoscopic evaluation of the left ear.  Otherwise canal is normal-appearing. Cardiovascular:     Rate and Rhythm: Normal rate and regular rhythm.  Pulmonary:     Effort: Pulmonary effort is normal. No respiratory distress.  Genitourinary:    Comments: Anus and rectum normal.  No rectal masses, growths, or bleeding.  Prostate feels smooth and symmetric without significant tenderness on palpation. Skin:    General: Skin is warm and dry.  Neurological:     Mental Status: He is alert.     Cranial Nerves: No facial asymmetry.  Psychiatric:        Mood and Affect: Affect normal.        Speech: Speech normal.        Behavior: Behavior normal.   Results  LABS PSA: within normal limits, 0.7 (09/2023)       Assessment & Plan History of syphilis Completed course of doxycycline  (due to penicillin allergy) in August. RPR in 4 months to confirm ongoing decrease in titer. Orders:   RPR; Future  Dysuria Acute 6-day duration of symptoms.  With some hesitancy, feeling of inadequate emptying, and weak stream.  He is really worried about prostate cancer as his twin brother was just diagnosed metastatic disease but I think this person's LUTS are unlikely to be due to  prostate cancer because of a normal PSA test in July of this year.  Doubt prostatitis without the tenderness on exam.  He has had some sexual encounters recently and does report barrier protection but I will check for STI given his history of same.  Check UA with reflex to culture as well. Orders:   Urine cytology ancillary only   UA/M w/rflx Culture, Routine  Risk for sexually transmitted infection  Orders:   Urine cytology ancillary only  Acute ear pain, left After recent use of earplugs.  Its improved with over-the-counter remedies.  Ear exam is normal today except for a little bit of tenderness with otoscopic evaluation on the left.  No obvious signs of otitis.    Return in about 3 months (around 05/10/2024), or sooner if needed.  [name] MD 02/10/2024, 4:48 PM

## 2024-02-11 LAB — MICROSCOPIC EXAMINATION
Bacteria, UA: NONE SEEN
Casts: NONE SEEN /LPF
Epithelial Cells (non renal): NONE SEEN /HPF (ref 0–10)
WBC, UA: NONE SEEN /HPF (ref 0–5)

## 2024-02-11 LAB — UA/M W/RFLX CULTURE, ROUTINE
Bilirubin, UA: NEGATIVE
Glucose, UA: NEGATIVE
Ketones, UA: NEGATIVE
Leukocytes,UA: NEGATIVE
Nitrite, UA: NEGATIVE
RBC, UA: NEGATIVE
Specific Gravity, UA: 1.029 (ref 1.005–1.030)
Urobilinogen, Ur: 1 mg/dL (ref 0.2–1.0)
pH, UA: 6 (ref 5.0–7.5)

## 2024-02-12 ENCOUNTER — Telehealth: Payer: Self-pay | Admitting: *Deleted

## 2024-02-12 ENCOUNTER — Ambulatory Visit: Payer: Self-pay | Admitting: Student

## 2024-02-12 LAB — URINE CYTOLOGY ANCILLARY ONLY
Chlamydia: NEGATIVE
Comment: NEGATIVE
Comment: NORMAL
Neisseria Gonorrhea: NEGATIVE

## 2024-02-12 NOTE — Telephone Encounter (Signed)
 Copied from CRM #[id number]. Topic: General - Other >> Feb 12, 2024 10:58 AM DeAngela L wrote: Reason for CRM: the patient would like to get a call back from the nurse to ask additional questions about the MRI results and additional questions why he had the pain he had the sharp pains from time to time and wondering why he blacked out 2 days before the MRI? Patient also asking if he does really strenuous work does he have to worry about blacking out and or his blood pressure shooting up really high?  Pt num  [phone]

## 2024-02-12 NOTE — Telephone Encounter (Signed)
 Return pt's call - no answer; left message on vm of office's return call.

## 2024-02-15 NOTE — Progress Notes (Signed)
 Internal Medicine Clinic Attending  Case discussed with the resident at the time of the visit.  We reviewed the resident's history and exam and pertinent patient test results.  I agree with the assessment, diagnosis, and plan of care documented in the resident's note.

## 2024-02-16 ENCOUNTER — Telehealth: Payer: Self-pay | Admitting: *Deleted

## 2024-02-16 NOTE — Telephone Encounter (Signed)
 RTC from patient stated that the eye doctor he was referred to does not do blurry vision.  They are mostly surgery.  Also had question about his GI referral.  Patient has been referred to Cloretta Sia with [facility] who stated that [facility] is about 2 months behind in scheduling.  Patient was called and given the number to  GI to call to schedule his appointment.  Patient also stated that when he was at [facility] the GI doctor found something during the Colonoscopy there and has followed up on that but he is unsure if the problem has returned.  Patient also is requesting scans of different areas in his body.  His mother was on the phone also and stated that the patient's twin who has passed away had similar problems and wants to find out if something is going on with her son that has been missed.  Patient states has told the doctor about his stomach issues and groin area issues  This is the reason for the request of Scans.  RTC to patient message was left that the Clinics had returned his called. SABRA

## 2024-02-16 NOTE — Telephone Encounter (Signed)
 RTC to patient.  Message left that the Clinics had returned his call.  Asked also what labs he is requesting to be done.

## 2024-02-22 NOTE — Telephone Encounter (Signed)
 Called pt - no answer; left message of office's return call.

## 2024-02-29 NOTE — Telephone Encounter (Signed)
 Patient has already been sent a My Chart message to contact the Eye Dr..  Re-routed GI referral as the patient is unwilling to wait to be sch with [facility] to [facility] who will reach out to the patient in about 1-2 weeks to be sch. Copied from CRM #[id number]. Topic: Referral - Question >> Feb 08, 2024  4:15 PM DeAngela L wrote: Reason for CRM: the patient calling about following up on the opthalmology referral for his vision concern and to ask who this would be referred to   Also Patient would like to ask about a getting his colon checked and a referral  for this appointment as well  Pt num [phone] >> Feb 23, 2024 10:20 AM Susanna ORN wrote: Patient called in wanting an update about the referrals and scans that he requested. Per chart notes, pt was told to call and schedule an appt for GI at [facility] but patient states he wasn't told to do so. Informed him that they are behind 2 months with scheduling. He stated that he can't wait two months and is requesting that we find someone else to refer him to. States that it doesn't have to be in [place]. He's also inquiring about the referral to ophthalmology as well. Please follow up with patient. CB #: [phone]. >> Feb 10, 2024  9:40 AM Chilon B wrote: Called patient LMOM that both of his Referral were already placed and sent to the following offices and he can go ahead and call their office to be sch.  This pt is sch for an appt today and can also get this information on a print out.  [facility] [place], Paticia Carry, MD Ophthalmology [address] [place], [place] [address] [phone]   [facility]   Gastroenterologist in [place], [place]    Located in: Donna MANO [facility] [address] Address: [address], [place], [place] [address]   Phone: [phone]

## 2024-02-29 NOTE — Telephone Encounter (Unsigned)
 Copied from CRM #[id number]. Topic: Referral - Question >> Feb 16, 2024 10:01 AM Farrel B wrote: Reason for CRM: pt called stating that he wanted to check on a referral for an eye doctor. He was given a number to check the status of the referral for the clinic where it had been sent, the patient threewayed the call so that I can speak with the office she stated that the office was for eye surgery, but the pt would need to see an optometrist not eye surgeon. Please call pt and advise.

## 2024-03-15 NOTE — Telephone Encounter (Signed)
 PT was Referred to [facility].  Will resend Referral again.   Copied from CRM #[id number]. Topic: Referral - Request for Referral >> Mar 11, 2024  4:16 PM Debby BROCKS wrote: Did the patient discuss referral with their provider in the last year? Yes (If No - schedule appointment) (If Yes - send message)  Appointment offered? No  Type of order/referral and detailed reason for visit: Colonoscopy, patient states he was referred to a location but missed their appointment twice and needs a new referral  Preference of office, provider, location: N/A  If referral order, have you been seen by this specialty before? Yes (If Yes, this issue or another issue? When? Where?  Can we respond through MyChart? Yes

## 2024-03-24 ENCOUNTER — Telehealth: Payer: Self-pay

## 2024-03-24 NOTE — Telephone Encounter (Signed)
 Reached out again to this pt.  HIs VM is not working and he does not answer the phone.  Also made a call to his Emergency contact Darleene to verify the # in the Chart is correct and it is   Pt has been called several times per previous messages on 02/08/2024, 02/10/2024,02/16/2024, 02/22/2024, 02/23/2024 and 02/29/2024 with no response.  The pt was also sent a letter in My Chart about his Referrals with no response and no appointment has been given . His referrals have been sent out and scheduled with  [facility] for his GI request.    Rec'd a fax today stating he was sch on 03/05/2024 and he no showed his appt.  The pt was resch to 03/23/2024.   Copied from CRM #[id number]. Topic: Referral - Question >> Mar 24, 2024  8:24 AM Alfonso ORN wrote: Reason for CRM: patient request to speak with a nurse to call pt back regarding a referral for a colonscopy , there is a mix up with what doctor name to use  Also have question regarding the eye referral

## 2024-03-29 ENCOUNTER — Other Ambulatory Visit: Payer: Self-pay

## 2024-03-29 DIAGNOSIS — B20 Human immunodeficiency virus [HIV] disease: Secondary | ICD-10-CM

## 2024-03-29 MED ORDER — ENSURE PO LIQD: 237.0000 mL | Freq: Two times a day (BID) | ORAL | 11 refills | Status: AC

## 2024-03-29 NOTE — Progress Notes (Signed)
 Rx faxed to [facility].   Jesusita Jocelyn, BSN, RN

## 2024-03-31 NOTE — Telephone Encounter (Signed)
 Please refer to message below.  Copied from CRM #[id number]. Topic: General - Other >> Mar 31, 2024 12:19 PM Chiquita SQUIBB wrote: Reason for CRM: Patient is calling in returning missed calls regarding issues with his referral, unable to reach the nurse as it is lunch time, advised patient a nurse will return his call. Patient confirmed he will stay by his phone [phone].
